# Patient Record
Sex: Female | Born: 1970
Health system: Southern US, Community
[De-identification: ages and names within clinical notes are randomized; demographics above are authoritative.]

## PROBLEM LIST (undated history)

## (undated) DIAGNOSIS — J449 Chronic obstructive pulmonary disease, unspecified: Secondary | ICD-10-CM

## (undated) DIAGNOSIS — K219 Gastro-esophageal reflux disease without esophagitis: Secondary | ICD-10-CM

## (undated) DIAGNOSIS — Z87448 Personal history of other diseases of urinary system: Secondary | ICD-10-CM

## (undated) DIAGNOSIS — I509 Heart failure, unspecified: Secondary | ICD-10-CM

## (undated) DIAGNOSIS — K449 Diaphragmatic hernia without obstruction or gangrene: Secondary | ICD-10-CM

## (undated) DIAGNOSIS — Z90710 Acquired absence of both cervix and uterus: Secondary | ICD-10-CM

## (undated) DIAGNOSIS — M1990 Unspecified osteoarthritis, unspecified site: Secondary | ICD-10-CM

## (undated) DIAGNOSIS — N3010 Interstitial cystitis (chronic) without hematuria: Secondary | ICD-10-CM

## (undated) DIAGNOSIS — M5030 Other cervical disc degeneration, unspecified cervical region: Secondary | ICD-10-CM

## (undated) DIAGNOSIS — M5136 Other intervertebral disc degeneration, lumbar region: Secondary | ICD-10-CM

## (undated) DIAGNOSIS — M542 Cervicalgia: Secondary | ICD-10-CM

## (undated) DIAGNOSIS — G8929 Other chronic pain: Secondary | ICD-10-CM

## (undated) DIAGNOSIS — Z87442 Personal history of urinary calculi: Secondary | ICD-10-CM

## (undated) DIAGNOSIS — M51369 Other intervertebral disc degeneration, lumbar region without mention of lumbar back pain or lower extremity pain: Secondary | ICD-10-CM

## (undated) DIAGNOSIS — Z8709 Personal history of other diseases of the respiratory system: Secondary | ICD-10-CM

## (undated) DIAGNOSIS — Z87440 Personal history of urinary (tract) infections: Secondary | ICD-10-CM

## (undated) HISTORY — DX: Gastro-esophageal reflux disease without esophagitis: K21.9

## (undated) HISTORY — DX: Interstitial cystitis (chronic) without hematuria: N30.10

## (undated) HISTORY — DX: Unspecified osteoarthritis, unspecified site: M19.90

## (undated) HISTORY — DX: Other cervical disc degeneration, unspecified cervical region: M50.30

## (undated) HISTORY — DX: Other intervertebral disc degeneration, lumbar region without mention of lumbar back pain or lower extremity pain: M51.369

## (undated) HISTORY — DX: Personal history of urinary (tract) infections: Z87.440

## (undated) HISTORY — DX: Personal history of other diseases of the respiratory system: Z87.09

## (undated) HISTORY — DX: Personal history of other diseases of urinary system: Z87.448

## (undated) HISTORY — DX: Other intervertebral disc degeneration, lumbar region: M51.36

## (undated) HISTORY — DX: Acquired absence of both cervix and uterus: Z90.710

## (undated) HISTORY — PX: BACK SURGERY: SHX140

## (undated) HISTORY — DX: Personal history of urinary calculi: Z87.442

## (undated) HISTORY — DX: Diaphragmatic hernia without obstruction or gangrene: K44.9

## (undated) HISTORY — PX: VAGINAL HYSTERECTOMY: SUR661

## (undated) HISTORY — PX: BLADDER SURGERY: SHX569

---

## 2008-10-15 ENCOUNTER — Ambulatory Visit: Payer: Self-pay

## 2009-03-10 ENCOUNTER — Ambulatory Visit: Payer: Self-pay | Admitting: Family Medicine

## 2009-09-20 ENCOUNTER — Ambulatory Visit: Payer: Self-pay | Admitting: Family Medicine

## 2009-12-30 ENCOUNTER — Ambulatory Visit: Payer: Self-pay | Admitting: Internal Medicine

## 2010-02-18 ENCOUNTER — Ambulatory Visit: Payer: Self-pay | Admitting: General Practice

## 2010-02-18 ENCOUNTER — Ambulatory Visit: Payer: Self-pay | Admitting: Internal Medicine

## 2010-03-04 ENCOUNTER — Ambulatory Visit: Payer: Self-pay | Admitting: General Practice

## 2010-03-30 ENCOUNTER — Ambulatory Visit: Payer: Self-pay | Admitting: Family Medicine

## 2010-03-31 ENCOUNTER — Ambulatory Visit: Payer: Self-pay | Admitting: Family Medicine

## 2010-11-24 ENCOUNTER — Ambulatory Visit: Payer: Self-pay | Admitting: Family Medicine

## 2011-10-28 ENCOUNTER — Emergency Department: Payer: Self-pay | Admitting: *Deleted

## 2011-10-28 LAB — URINALYSIS, COMPLETE
Bacteria: NONE SEEN
Bilirubin,UR: NEGATIVE
Protein: NEGATIVE
RBC,UR: 3 /HPF (ref 0–5)
Specific Gravity: 1.004 (ref 1.003–1.030)
Squamous Epithelial: 1
WBC UR: 25 /HPF (ref 0–5)

## 2011-11-21 ENCOUNTER — Ambulatory Visit: Payer: Self-pay | Admitting: Urology

## 2011-11-26 ENCOUNTER — Ambulatory Visit: Payer: Self-pay | Admitting: Urology

## 2011-12-28 ENCOUNTER — Emergency Department: Payer: Self-pay | Admitting: *Deleted

## 2012-02-18 ENCOUNTER — Ambulatory Visit: Payer: Self-pay | Admitting: Urology

## 2012-02-22 ENCOUNTER — Ambulatory Visit: Payer: Self-pay | Admitting: Family Medicine

## 2012-05-26 ENCOUNTER — Other Ambulatory Visit: Payer: Self-pay | Admitting: Pain Medicine

## 2012-05-26 ENCOUNTER — Ambulatory Visit: Payer: Self-pay | Admitting: Pain Medicine

## 2012-05-26 LAB — SEDIMENTATION RATE: Erythrocyte Sed Rate: 38 mm/hr — ABNORMAL HIGH (ref 0–20)

## 2012-06-02 ENCOUNTER — Ambulatory Visit: Payer: Self-pay | Admitting: Pain Medicine

## 2012-06-02 LAB — CREATININE, SERUM
Creatinine: 0.77 mg/dL (ref 0.60–1.30)
EGFR (African American): >60
EGFR (Non-African Amer.): >60

## 2012-07-30 ENCOUNTER — Ambulatory Visit: Payer: Self-pay | Admitting: Pain Medicine

## 2012-08-13 ENCOUNTER — Ambulatory Visit: Payer: Self-pay | Admitting: Pain Medicine

## 2012-08-25 ENCOUNTER — Ambulatory Visit: Payer: Self-pay | Admitting: Pain Medicine

## 2012-08-26 ENCOUNTER — Ambulatory Visit: Payer: Self-pay | Admitting: Pain Medicine

## 2012-09-26 ENCOUNTER — Ambulatory Visit: Payer: Self-pay | Admitting: Pain Medicine

## 2012-10-24 ENCOUNTER — Ambulatory Visit: Payer: Self-pay | Admitting: Pain Medicine

## 2012-10-28 ENCOUNTER — Ambulatory Visit: Payer: Self-pay | Admitting: Pain Medicine

## 2012-11-20 ENCOUNTER — Ambulatory Visit: Payer: Self-pay | Admitting: Pain Medicine

## 2013-02-18 DIAGNOSIS — R3911 Hesitancy of micturition: Secondary | ICD-10-CM | POA: Insufficient documentation

## 2013-02-18 DIAGNOSIS — M6289 Other specified disorders of muscle: Secondary | ICD-10-CM | POA: Insufficient documentation

## 2013-02-20 ENCOUNTER — Ambulatory Visit: Payer: Self-pay | Admitting: Pain Medicine

## 2013-06-19 ENCOUNTER — Ambulatory Visit: Payer: Self-pay | Admitting: Pain Medicine

## 2013-10-09 ENCOUNTER — Ambulatory Visit: Payer: Self-pay | Admitting: Pain Medicine

## 2013-10-27 ENCOUNTER — Ambulatory Visit: Payer: Self-pay | Admitting: Pain Medicine

## 2013-11-18 ENCOUNTER — Ambulatory Visit: Payer: Self-pay | Admitting: Pain Medicine

## 2013-12-22 ENCOUNTER — Other Ambulatory Visit: Payer: Self-pay | Admitting: Pain Medicine

## 2013-12-22 ENCOUNTER — Ambulatory Visit: Payer: Self-pay | Admitting: Pain Medicine

## 2013-12-22 LAB — CBC WITH DIFFERENTIAL/PLATELET
BASOS ABS: 0.1 10*3/uL (ref 0.0–0.1)
Basophil %: 1.4 %
EOS ABS: 0.2 10*3/uL (ref 0.0–0.7)
Eosinophil %: 2.7 %
HCT: 37.7 % (ref 35.0–47.0)
HGB: 12.7 g/dL (ref 12.0–16.0)
Lymphocyte #: 3.3 10*3/uL (ref 1.0–3.6)
Lymphocyte %: 36.9 %
MCH: 30.8 pg (ref 26.0–34.0)
MCHC: 33.8 g/dL (ref 32.0–36.0)
MCV: 91 fL (ref 80–100)
MONOS PCT: 5.3 %
Monocyte #: 0.5 x10 3/mm (ref 0.2–0.9)
Neutrophil #: 4.8 10*3/uL (ref 1.4–6.5)
Neutrophil %: 53.7 %
Platelet: 230 10*3/uL (ref 150–440)
RBC: 4.14 10*6/uL (ref 3.80–5.20)
RDW: 12.8 % (ref 11.5–14.5)
WBC: 8.9 10*3/uL (ref 3.6–11.0)

## 2013-12-22 LAB — SEDIMENTATION RATE: Erythrocyte Sed Rate: 6 mm/hr (ref 0–20)

## 2014-01-08 ENCOUNTER — Ambulatory Visit: Payer: Self-pay | Admitting: Pain Medicine

## 2014-01-15 ENCOUNTER — Ambulatory Visit: Payer: Self-pay | Admitting: Pain Medicine

## 2014-04-28 ENCOUNTER — Ambulatory Visit: Payer: Self-pay | Admitting: Pain Medicine

## 2014-09-28 NOTE — Op Note (Signed)
PATIENT NAME:  Brenda OsmondWADE, Valen J MR#:  [id number] DATE OF BIRTH:  Aug 03, 1970  DATE OF PROCEDURE:  02/18/2012  PREOPERATIVE DIAGNOSES:  1. Interstitial cystitis. 2. Recurrent UTIs. 3. Pseudomembranous trigonitis.   PROCEDURES:  1. Cystoscopy.  2. Hydrodilation.  3. Fulguration of trigone.  4. Fulguration of urethral polyps.   ANESTHESIA: General.  SURGEON: Rica KoyanagiJohn S. Caila Cirelli, MD  INDICATIONS: This patient has had symptoms of urinary tract infection, dysuria, frequency and bladder pain. He has been found to have interstitial cystitis in the past. Conservative measures have not controlled her symptoms adequately at this time. Cystoscopy revealed pseudomembranous trigonitis and urethral polyps in addition to her known interstitial cystitis. She is scheduled for cysto, hydrodilation for therapeutic purposes and fulguration of involved areas.   DESCRIPTION OF PROCEDURE: In the dorsal lithotomy position under general anesthesia lower abdomen and genital area was prepped and draped for endoscopic work. The #21 cystoscope was introduced and inspection performed revealing the previously noted pseudomembranous trigonitis and urethral polyps. No stones or tumors were present within the bladder. Hydrodilation was performed, the bladder accepting 800, 875 and 975 mL respectively. Numerous glomerulations occurred following hydrodilation mostly located in the bladder base, peri-trigone area. Following hydrodilation the ball electrode was introduced and the trigone carefully cauterized. Care was taken not to cauterize into the muscle layers of the bladder. The polyps noted in the proximal urethra were likewise cauterized under direct vision. Following the procedure a #16 [place] Foley catheter was introduced.   The patient tolerated the procedure well and returned to the recovery area in satisfactory condition.  ____________________________ Rica KoyanagiJohn S. Trenee Igoe, MD jsh:cms D: 02/18/2012 14:17:07 ET T: 02/18/2012  15:20:48 ET JOB#: [id number]  cc: Rica KoyanagiJohn S. Kennetta Pavlovic, MD, <Dictator>  Rica KoyanagiJOHN S Camiya Vinal MD ELECTRONICALLY SIGNED 02/26/2012 12:31

## 2014-12-06 ENCOUNTER — Ambulatory Visit (INDEPENDENT_AMBULATORY_CARE_PROVIDER_SITE_OTHER): Payer: Medicaid Other | Admitting: Family Medicine

## 2014-12-06 ENCOUNTER — Encounter: Payer: Self-pay | Admitting: Family Medicine

## 2014-12-06 VITALS — BP 120/80 | HR 78 | Ht 62.0 in | Wt 131.0 lb

## 2014-12-06 DIAGNOSIS — J45901 Unspecified asthma with (acute) exacerbation: Secondary | ICD-10-CM

## 2014-12-06 DIAGNOSIS — J219 Acute bronchiolitis, unspecified: Secondary | ICD-10-CM | POA: Diagnosis not present

## 2014-12-06 MED ORDER — AMOXICILLIN-POT CLAVULANATE 875-125 MG PO TABS: 1.0000 | ORAL_TABLET | Freq: Two times a day (BID) | ORAL | Status: DC

## 2014-12-06 MED ORDER — ALBUTEROL SULFATE (2.5 MG/3ML) 0.083% IN NEBU: 2.5000 mg | INHALATION_SOLUTION | Freq: Once | RESPIRATORY_TRACT | Status: DC

## 2014-12-06 MED ORDER — GUAIFENESIN-CODEINE 100-10 MG/5ML PO SOLN: 5.0000 mL | Freq: Three times a day (TID) | ORAL | Status: DC | PRN

## 2014-12-06 MED ORDER — PREDNISONE 10 MG PO TABS: 10.0000 mg | ORAL_TABLET | Freq: Every day | ORAL | Status: DC

## 2014-12-06 NOTE — Patient Instructions (Signed)
Smoking: Ways to Quit   Know why you want to quit.   When you quit smoking, your body gets to work repairing damaged tissues. Here are some of the health benefits:   You stop the destruction of your lungs.  Your lungs are better able to fight colds, and other respiratory infections.  You decrease your risk of cancer, heart disease, strokes, and circulation problems.   In addition, when you quit you will:   Feel more in control of your life.  Have better smelling hair, breath, clothes, home, and car.  Have more stamina for activities.  Save money.  Protect your family and friends from the dangers of secondhand smoke.   Smoking is an addictive habit. Most former smokers make several attempts to quit before they finally succeed. So, never say, "I can't." Just keep trying.   Set a quit date.   Set a date for when you will stop smoking. Don't buy cigarettes to carry you beyond your last day. Tell your family and friends you plan to quit, and ask for their support and encouragement. Ask them not to offer you cigarettes.  Make a plan.   5 Days Before Your Quit Date   Think about your reasons for quitting.  Tell your friends and family you are planning to quit.  Stop buying cigarettes.   4 Days Before Your Quit Date   Pay attention to when and why you smoke.  Think of other things to hold in your hand instead of a cigarette.  Think of habits or routines to change.   3 Days Before Your Quit Date   Plan what you will do with the extra money when you stop buying cigarettes.  Think of whom you can reach out to when you need help.   2 Days Before Your Quit Date   Consider buying nonprescription nicotine patches or nicotine gum. Or see your health care provider to get a prescription for the nicotine inhaler, nasal spray, or other medicine that can help.     1 Day Before Your Quit Date   Put away lighters and ashtrays.  Throw away all cigarettes and matches - no emergency stashes  are allowed!  Clean your clothes to get rid of the smell of cigarette smoke.   Quit Day   Keep very busy.  Remind family and friends that this is your quit day.  Stay away from alcohol.  Stay away from places where you used to smoke and people you used to smoke with.  Give yourself a treat or do something else special.   Commit to staying quit.   Make sure that all your cigarettes and ashtrays are thrown away.   If you keep cigarettes or ashtrays around, sooner or later you'll break down and smoke one, then another, then another, and so on. Throw them away. Make it hard to start again.   Because you are used to having something in your mouth, you may want to chew gum as a substitute for smoking. Or munch on carrots or celery.   Spend time with nonsmokers rather than with smokers.   Think of yourself as a nonsmoker. Tell other people that you are a nonsmoker (for example, in restaurants). Stay away from places where there are a lot of smokers, such as bars. Avoid spending time with smokers. You can't tell others not to smoke, but you don't have to sit with them while they do. Plan on walking away from cigarette smoke. Spend time   with nonsmokers and sit in the nonsmoking section of restaurants.   Be prepared for relapse or difficult situations.   Most people who go back to smoking cigarettes do so within the first 3 months after quitting. Many people try 5 or more times before they successfully quit. Avoid drinking alcohol, because it lowers your chances of success. Don't be distracted by the weight you may gain after quitting. Smokers usually do not gain more than 10 pounds when they stop smoking. Learn new ways to improve your mood and overcome depression.   Start an exercise program.   As you become more fit, you will not want the nicotine effects in your body. Regular exercise will also help keep you from gaining weight.   Keep your hands busy.   You may not know what to do with  your hands for a while. Try reading, knitting, needlework, pottery, drawing, making a plastic model, or doing a jigsaw puzzle. Join special interest groups that keep you involved in your hobby.      Take on new activities.   Change your routine. Take on new activities that don't include smoking. Join an exercise group and work out regularly. Sign up for an evening class or a join a study group at your place of worship. Go on more outings with your family or friends. Learn ways to relax and manage stress.   Join a quit-smoking program if it helps.   Some people do better in groups, or with a set of instructions to follow. That's fine, too. Remember, the goal is to quit smoking. It doesn't matter how you do it.   Consider using nicotine replacement therapy.   Nicotine is the drug that is in tobacco. You can use nicotine patches or gum, available without a prescription at your local pharmacy, to help you quit smoking. Quitting smoking is a two-step process. It includes breaking the physical addiction to nicotine and breaking the smoking habit. Nicotine replacement helps take care of the nicotine addiction so that you can focus on breaking the habit.   Your health care provider can prescribe nicotine substitutes that can almost double your chances of quitting for good. They are:   Zyban (bupropion HCL)  nicotine inhaler  nicotine lozenge  nicotine nasal spray  nicotine patch.   None of these treatments is a miracle cure. Quitting can be hard work, but you can learn to live without cigarettes in your daily life.  

## 2014-12-06 NOTE — Progress Notes (Signed)
Name: Brenda Berry   MRN: [id number]    DOB: Jun 08, 1971   Date:12/06/2014       Progress Note  Subjective  Chief Complaint  Chief Complaint  Patient presents with  . Cough  . Bronchitis    Cough This is a new problem. The current episode started in the past 7 days. The problem has been gradually worsening. The problem occurs constantly. The cough is non-productive. Associated symptoms include nasal congestion, shortness of breath and wheezing. Pertinent negatives include no chest pain, chills, ear congestion, ear pain, eye redness, fever, headaches, heartburn, hemoptysis, myalgias, rash, rhinorrhea, sore throat, sweats or weight loss. Nothing aggravates the symptoms. Risk factors for lung disease include smoking/tobacco exposure. She has tried nothing for the symptoms. The treatment provided mild relief. There is no history of asthma, bronchiectasis, bronchitis, COPD, emphysema, environmental allergies or pneumonia.    No problem-specific assessment & plan notes found for this encounter.   Past Medical History  Diagnosis Date  . Interstitial cystitis     Past Surgical History  Procedure Laterality Date  . Back surgery    . Vaginal hysterectomy    . Bladder surgery      Family History  Problem Relation Age of Onset  . Diabetes Mother   . Hyperlipidemia Mother   . Hypertension Mother   . Heart disease Father     History   Social History  . Marital Status: Divorced    Spouse Name: N/A  . Number of Children: N/A  . Years of Education: N/A   Occupational History  . Not on file.   Social History Main Topics  . Smoking status: Current Every Day Smoker  . Smokeless tobacco: Not on file  . Alcohol Use: 0.0 oz/week    0 Standard drinks or equivalent per week  . Drug Use: No  . Sexual Activity: Yes   Other Topics Concern  . Not on file   Social History Narrative  . No narrative on file    Allergies  Allergen Reactions  . Ciprofloxacin   . Demerol [Meperidine]    . Ibuprofen   . Latex   . Nifedipine   . Sulfa Antibiotics   . Terbutaline      Review of Systems  Constitutional: Negative for fever, chills, weight loss, malaise/fatigue and diaphoresis.  HENT: Negative.  Negative for congestion, ear pain, hearing loss, nosebleeds, rhinorrhea, sore throat and tinnitus.   Eyes: Negative.  Negative for blurred vision, double vision, photophobia, pain, discharge and redness.  Respiratory: Positive for cough, shortness of breath and wheezing. Negative for hemoptysis.   Cardiovascular: Negative.  Negative for chest pain, palpitations, orthopnea, claudication, leg swelling and PND.  Gastrointestinal: Negative.  Negative for heartburn, nausea, vomiting, abdominal pain, diarrhea, constipation, blood in stool and melena.  Genitourinary: Negative.  Negative for dysuria, urgency, frequency, hematuria and flank pain.  Musculoskeletal: Negative for myalgias, back pain, joint pain and falls.  Skin: Negative.  Negative for rash.  Neurological: Negative for dizziness, tingling, tremors, weakness and headaches.  Endo/Heme/Allergies: Negative for environmental allergies.  Psychiatric/Behavioral: Negative for depression.     Objective  Filed Vitals:   12/06/14 1420  BP: 120/80  Pulse: 78  Height: 5\' 2"  (1.575 m)  Weight: 131 lb (59.421 kg)  SpO2: 98%    Physical Exam  Constitutional: She is well-developed, well-nourished, and in no distress. No distress.  HENT:  Head: Normocephalic and atraumatic.  Right Ear: External ear normal.  Left Ear: External ear normal.  Nose: Nose normal.  Mouth/Throat: Oropharynx is clear and moist.  Eyes: Conjunctivae and EOM are normal. Pupils are equal, round, and reactive to light. Right eye exhibits no discharge. Left eye exhibits no discharge.  Neck: Normal range of motion. Neck supple. No JVD present. No thyromegaly present.  Cardiovascular: Normal rate, regular rhythm, normal heart sounds and intact distal pulses.   Exam reveals no gallop and no friction rub.   No murmur heard. Pulmonary/Chest: She is in respiratory distress. She has wheezes. She has no rales.  Abdominal: Soft. Bowel sounds are normal. She exhibits no mass. There is no tenderness. There is no guarding.  Musculoskeletal: Normal range of motion. She exhibits no edema.  Lymphadenopathy:    She has no cervical adenopathy.  Neurological: She is alert. She has normal reflexes.  Skin: Skin is warm and dry. She is not diaphoretic.  Psychiatric: Mood and affect normal.  Nursing note and vitals reviewed.     Assessment & Plan  Problem List Items Addressed This Visit    None    Visit Diagnoses    Reactive airway disease with acute exacerbation    -  Primary    Relevant Medications    predniSONE (DELTASONE) 10 MG tablet    guaiFENesin-codeine 100-10 MG/5ML syrup    albuterol (PROVENTIL) (2.5 MG/3ML) 0.083% nebulizer solution 2.5 mg (Start on 12/06/2014  3:15 PM)    Bronchiolitis        Relevant Medications    predniSONE (DELTASONE) 10 MG tablet    amoxicillin-clavulanate (AUGMENTIN) 875-125 MG per tablet    guaiFENesin-codeine 100-10 MG/5ML syrup         Dr. Hay[facility]  12/06/2014

## 2014-12-07 ENCOUNTER — Other Ambulatory Visit: Payer: Self-pay

## 2014-12-07 DIAGNOSIS — J219 Acute bronchiolitis, unspecified: Secondary | ICD-10-CM

## 2014-12-07 MED ORDER — ALBUTEROL SULFATE HFA 108 (90 BASE) MCG/ACT IN AERS: 2.0000 | INHALATION_SPRAY | Freq: Four times a day (QID) | RESPIRATORY_TRACT | Status: DC | PRN

## 2014-12-08 ENCOUNTER — Other Ambulatory Visit: Payer: Self-pay

## 2014-12-08 DIAGNOSIS — J219 Acute bronchiolitis, unspecified: Secondary | ICD-10-CM

## 2014-12-08 MED ORDER — LEVOFLOXACIN 500 MG PO TABS: 500.0000 mg | ORAL_TABLET | Freq: Every day | ORAL | Status: DC

## 2015-01-28 DIAGNOSIS — Z720 Tobacco use: Secondary | ICD-10-CM | POA: Insufficient documentation

## 2015-04-19 ENCOUNTER — Encounter: Payer: Self-pay | Admitting: Pain Medicine

## 2015-04-25 ENCOUNTER — Other Ambulatory Visit: Payer: Self-pay | Admitting: Pain Medicine

## 2015-04-25 ENCOUNTER — Encounter: Payer: Self-pay | Admitting: Pain Medicine

## 2015-04-25 ENCOUNTER — Ambulatory Visit: Payer: Medicaid Other | Attending: Pain Medicine | Admitting: Pain Medicine

## 2015-04-25 VITALS — BP 142/52 | HR 125 | Temp 99.0°F | Resp 16 | Ht 62.0 in | Wt 130.0 lb

## 2015-04-25 DIAGNOSIS — R102 Pelvic and perineal pain unspecified side: Secondary | ICD-10-CM

## 2015-04-25 DIAGNOSIS — G8929 Other chronic pain: Secondary | ICD-10-CM

## 2015-04-25 DIAGNOSIS — F17200 Nicotine dependence, unspecified, uncomplicated: Secondary | ICD-10-CM | POA: Diagnosis not present

## 2015-04-25 DIAGNOSIS — R52 Pain, unspecified: Secondary | ICD-10-CM

## 2015-04-25 DIAGNOSIS — Z8709 Personal history of other diseases of the respiratory system: Secondary | ICD-10-CM

## 2015-04-25 DIAGNOSIS — M47812 Spondylosis without myelopathy or radiculopathy, cervical region: Secondary | ICD-10-CM | POA: Diagnosis not present

## 2015-04-25 DIAGNOSIS — M5020 Other cervical disc displacement, unspecified cervical region: Secondary | ICD-10-CM

## 2015-04-25 DIAGNOSIS — M1990 Unspecified osteoarthritis, unspecified site: Secondary | ICD-10-CM | POA: Diagnosis not present

## 2015-04-25 DIAGNOSIS — Z720 Tobacco use: Secondary | ICD-10-CM

## 2015-04-25 DIAGNOSIS — F1190 Opioid use, unspecified, uncomplicated: Secondary | ICD-10-CM | POA: Diagnosis not present

## 2015-04-25 DIAGNOSIS — M47816 Spondylosis without myelopathy or radiculopathy, lumbar region: Secondary | ICD-10-CM | POA: Insufficient documentation

## 2015-04-25 DIAGNOSIS — M150 Primary generalized (osteo)arthritis: Secondary | ICD-10-CM

## 2015-04-25 DIAGNOSIS — Z5181 Encounter for therapeutic drug level monitoring: Secondary | ICD-10-CM

## 2015-04-25 DIAGNOSIS — M50221 Other cervical disc displacement at C4-C5 level: Secondary | ICD-10-CM | POA: Diagnosis not present

## 2015-04-25 DIAGNOSIS — M545 Low back pain, unspecified: Secondary | ICD-10-CM

## 2015-04-25 DIAGNOSIS — Z90710 Acquired absence of both cervix and uterus: Secondary | ICD-10-CM

## 2015-04-25 DIAGNOSIS — K449 Diaphragmatic hernia without obstruction or gangrene: Secondary | ICD-10-CM

## 2015-04-25 DIAGNOSIS — G9619 Other disorders of meninges, not elsewhere classified: Secondary | ICD-10-CM | POA: Diagnosis not present

## 2015-04-25 DIAGNOSIS — M5416 Radiculopathy, lumbar region: Secondary | ICD-10-CM

## 2015-04-25 DIAGNOSIS — M5126 Other intervertebral disc displacement, lumbar region: Secondary | ICD-10-CM | POA: Insufficient documentation

## 2015-04-25 DIAGNOSIS — G96198 Other disorders of meninges, not elsewhere classified: Secondary | ICD-10-CM

## 2015-04-25 DIAGNOSIS — M50222 Other cervical disc displacement at C5-C6 level: Secondary | ICD-10-CM | POA: Diagnosis not present

## 2015-04-25 DIAGNOSIS — Z79899 Other long term (current) drug therapy: Secondary | ICD-10-CM | POA: Diagnosis not present

## 2015-04-25 DIAGNOSIS — Z87448 Personal history of other diseases of urinary system: Secondary | ICD-10-CM

## 2015-04-25 DIAGNOSIS — R32 Unspecified urinary incontinence: Secondary | ICD-10-CM

## 2015-04-25 DIAGNOSIS — N3010 Interstitial cystitis (chronic) without hematuria: Secondary | ICD-10-CM

## 2015-04-25 DIAGNOSIS — Z8742 Personal history of other diseases of the female genital tract: Secondary | ICD-10-CM

## 2015-04-25 DIAGNOSIS — M961 Postlaminectomy syndrome, not elsewhere classified: Secondary | ICD-10-CM

## 2015-04-25 DIAGNOSIS — N949 Unspecified condition associated with female genital organs and menstrual cycle: Secondary | ICD-10-CM

## 2015-04-25 DIAGNOSIS — Z87442 Personal history of urinary calculi: Secondary | ICD-10-CM

## 2015-04-25 DIAGNOSIS — M159 Polyosteoarthritis, unspecified: Secondary | ICD-10-CM

## 2015-04-25 DIAGNOSIS — Z79891 Long term (current) use of opiate analgesic: Secondary | ICD-10-CM | POA: Insufficient documentation

## 2015-04-25 DIAGNOSIS — F1120 Opioid dependence, uncomplicated: Secondary | ICD-10-CM

## 2015-04-25 DIAGNOSIS — M533 Sacrococcygeal disorders, not elsewhere classified: Secondary | ICD-10-CM

## 2015-04-25 DIAGNOSIS — M791 Myalgia: Secondary | ICD-10-CM

## 2015-04-25 DIAGNOSIS — J410 Simple chronic bronchitis: Secondary | ICD-10-CM

## 2015-04-25 DIAGNOSIS — M5136 Other intervertebral disc degeneration, lumbar region: Secondary | ICD-10-CM

## 2015-04-25 DIAGNOSIS — J449 Chronic obstructive pulmonary disease, unspecified: Secondary | ICD-10-CM | POA: Insufficient documentation

## 2015-04-25 DIAGNOSIS — M5116 Intervertebral disc disorders with radiculopathy, lumbar region: Secondary | ICD-10-CM

## 2015-04-25 DIAGNOSIS — M4726 Other spondylosis with radiculopathy, lumbar region: Secondary | ICD-10-CM | POA: Diagnosis not present

## 2015-04-25 DIAGNOSIS — M792 Neuralgia and neuritis, unspecified: Secondary | ICD-10-CM

## 2015-04-25 DIAGNOSIS — M51369 Other intervertebral disc degeneration, lumbar region without mention of lumbar back pain or lower extremity pain: Secondary | ICD-10-CM

## 2015-04-25 DIAGNOSIS — M7918 Myalgia, other site: Secondary | ICD-10-CM

## 2015-04-25 DIAGNOSIS — Z87440 Personal history of urinary (tract) infections: Secondary | ICD-10-CM

## 2015-04-25 DIAGNOSIS — M5412 Radiculopathy, cervical region: Secondary | ICD-10-CM

## 2015-04-25 DIAGNOSIS — M539 Dorsopathy, unspecified: Secondary | ICD-10-CM

## 2015-04-25 DIAGNOSIS — Z91041 Radiographic dye allergy status: Secondary | ICD-10-CM

## 2015-04-25 MED ORDER — OXYCODONE HCL 10 MG PO TABS: 10.0000 mg | ORAL_TABLET | ORAL | Status: DC | PRN

## 2015-04-25 MED ORDER — GABAPENTIN 400 MG PO CAPS: 400.0000 mg | ORAL_CAPSULE | Freq: Four times a day (QID) | ORAL | Status: DC

## 2015-04-25 MED ORDER — CYCLOBENZAPRINE HCL 10 MG PO TABS: 10.0000 mg | ORAL_TABLET | Freq: Every day | ORAL | Status: DC | PRN

## 2015-04-25 NOTE — Progress Notes (Signed)
Patient's Name: Brenda Berry MRN: [id number] DOB: 01-16-1971 DOS: 04/25/2015  Primary Reason(s) for Visit: Encounter for Medication Management. CC: Pain   HPI:   Brenda Berry is a 44 y.o. year old, female patient, who returns today as an established patient. She has Chronic pain; Long term current use of opiate analgesic; Long term prescription opiate use; Opiate use; Opiate dependence (HCC); Encounter for therapeutic drug level monitoring; Current tobacco use; Chronic low back pain; Lumbar spondylosis; Chronic radicular Cervical pain (Right C8 Dermatomal distribution); Cervical spondylosis; Chronic radicular Lumbar pain (Right S1 Dermatomal pain); Allergy to contrast media (used for diagnostic x-rays); Failed back surgical syndrome; Epidural fibrosis; Musculoskeletal pain; Neurogenic pain; Neuropathic pain; Bulging lumbar disc (L3-4, L4-5, and L5-S1); Lumbar disc herniation with radiculopathy (large L4-5 disc herniated nucleus pulposus with central and right paracentral extrusion); Chronic pelvic pain in female; History of endometriosis; Sacrococcygeal pain (tailbone pain/sacrococcygeal neuralgia); Coccygodynia; Herniated cervical disc (C4-5 and C5-6); Tobacco abuse; Chronic obstructive pulmonary disease (COPD) (HCC); Urinary incontinence; History of bronchitis; Hiatal hernia; Pain due to interstitial cystitis; History of recurrent UTI (urinary tract infection); History of nephrolithiasis; History of hematuria; Osteoarthrosis; and Status post hysterectomy on her problem list.. Her primarily concern today is the Pain    Since the last time I saw the patient, she refers having had an episode where she was sitting out on the porch and dosed of but when she woke up, her entire right side of the body was paralyzed. She was recently hospitalized and she indicated that at the time she had loss of bowel and bladder control, and again secondary to her spine problems but they were unable to do an MRI because she  could not stay still. This point she thinks that she can and I do agree that we need to do an MRI of the cervical as well as the lumbar spine since the patient is having problems at both levels. Today's Pain Score: 6  Reported level of pain is incompatible with clinical obrservations. This may be secondary to a possible lack of understanding on how the pain scale works. Pain Type: Chronic pain Pain Location: Neck Pain Descriptors / Indicators: Tingling, Sharp, Shooting, Numbness, Burning, Aching Pain Frequency: Constant  Date of Last Visit: Date of Last Visit: 02/03/15 Service Provided on Last Visit: Service Provided on Last Visit: Med Refill  Pharmacotherapy Review:   Side-effects or Adverse reactions: None reported. Effectiveness: Described as relatively effective, allowing for increase in activities of daily living (ADL). Onset of action: Within expected pharmacological parameters. Duration of action: Within normal limits for medication. Peak effect: Timing and results are as within normal expected parameters. [place] PMP: Compliant with practice rules and regulations. UDS: Compliant with practice rules and regulations. Lab work: No new labs ordered by our practice. Treatment compliance: Compliant. Substance Use Disorder (SUD) Risk Level: Low Planned course of action: Continue therapy as is.  Allergies: Brenda Berry is allergic to ciprofloxacin; demerol; ibuprofen; latex; nifedipine; sulfa antibiotics; and terbutaline.  Meds: The patient has a current medication list which includes the following prescription(s): albuterol, cyclobenzaprine, gabapentin, oxycodone hcl, oxycodone hcl, and oxycodone hcl, and the following Facility-Administered Medications: albuterol. Requested Prescriptions   Signed Prescriptions Disp Refills  . Oxycodone HCl 10 MG TABS 180 tablet 0    Sig: Take 1 tablet (10 mg total) by mouth every 4 (four) hours as needed.  . gabapentin (NEURONTIN) 400 MG capsule 120 capsule  2    Sig: Take 1 capsule (400 mg total) by mouth  4 (four) times daily.  . cyclobenzaprine (FLEXERIL) 10 MG tablet 30 tablet 2    Sig: Take 1 tablet (10 mg total) by mouth daily as needed for muscle spasms.  . Oxycodone HCl 10 MG TABS 180 tablet 0    Sig: Take 1 tablet (10 mg total) by mouth every 4 (four) hours as needed.  . Oxycodone HCl 10 MG TABS 180 tablet 0    Sig: Take 1 tablet (10 mg total) by mouth every 4 (four) hours as needed.    ROS: Constitutional: Afebrile, no chills, well hydrated and well nourished Gastrointestinal: negative Musculoskeletal:negative Neurological: negative Behavioral/Psych: negative  PFSH: Medical:  Brenda Berry  has a past medical history of Interstitial cystitis; Arthritis; Degenerative disc disease, cervical; Degenerative disc disease, lumbar; History of bronchitis (04/26/2015); Hiatal hernia (04/26/2015); History of recurrent UTI (urinary tract infection) (04/26/2015); History of nephrolithiasis (04/26/2015); History of hematuria (04/26/2015); and Status post hysterectomy (04/26/2015). Family: family history includes Diabetes in her mother; Heart disease in her father; Hyperlipidemia in her mother; Hypertension in her mother. Surgical:  has past surgical history that includes Back surgery; Vaginal hysterectomy; and Bladder surgery. Tobacco:  reports that she has been smoking.  She does not have any smokeless tobacco history on file. Alcohol:  reports that she does not drink alcohol. Drug:  reports that she does not use illicit drugs.  Physical Exam: Vitals:  Today's Vitals   04/25/15 0849 04/25/15 0850  BP: 142/52   Pulse: 125   Temp: 99 F (37.2 C)   TempSrc: Oral   Resp: 16   Height:  (1.575 m)   Weight: 130 lb (58.968 kg)   SpO2: 100%   PainSc: 6  6   PainLoc: Neck   Calculated BMI: Body mass index is 23.77 kg/(m^2). General appearance: alert, cooperative, appears stated age and mild distress Eyes: conjunctivae/corneas clear. PERRL,  EOM's intact. Fundi benign. Lungs: No evidence respiratory distress, no audible rales or ronchi and no use of accessory muscles of respiration Neck: no adenopathy, no carotid bruit, no JVD, supple, symmetrical, trachea midline and thyroid not enlarged, symmetric, no tenderness/mass/nodules Back: symmetric, no curvature. ROM normal. No CVA tenderness. Extremities: Has a brace on her right wrist. Pulses: 2+ and symmetric Skin: Skin color, texture, turgor normal. No rashes or lesions Neurologic: Grossly normal    Assessment: Encounter Diagnosis:  Primary Diagnosis: Chronic pain [G89.29]  Plan: Marlette was seen today for pain.  Diagnoses and all orders for this visit:  Chronic pain -     Drugs of abuse screen w/o alc, rtn urine-sln; Standing -     COMPLETE METABOLIC PANEL WITH GFR; Future -     C-reactive protein; Future -     Magnesium; Future -     Sedimentation rate; Future -     Vitamin D2,D3 Panel; Future -     Oxycodone HCl 10 MG TABS; Take 1 tablet (10 mg total) by mouth every 4 (four) hours as needed. -     gabapentin (NEURONTIN) 400 MG capsule; Take 1 capsule (400 mg total) by mouth 4 (four) times daily. -     cyclobenzaprine (FLEXERIL) 10 MG tablet; Take 1 tablet (10 mg total) by mouth daily as needed for muscle spasms. -     Oxycodone HCl 10 MG TABS; Take 1 tablet (10 mg total) by mouth every 4 (four) hours as needed. -     Oxycodone HCl 10 MG TABS; Take 1 tablet (10 mg total) by mouth every 4 (four)  hours as needed.  Long term current use of opiate analgesic -     Drugs of abuse screen w/o alc, rtn urine-sln  Long term prescription opiate use  Opiate use  Uncomplicated opioid dependence (HCC)  Encounter for therapeutic drug level monitoring  Chronic low back pain  Osteoarthritis of spine with radiculopathy, lumbar region  Chronic radicular cervical pain (Right C8 dermatomal distribution) -     MR Cervical Spine Wo Contrast; Future -     Ambulatory referral to  Neurosurgery  Cervical spondylosis  Chronic radicular lumbar pain (right side) -     MR Lumbar Spine Wo Contrast; Future -     Ambulatory referral to Neurosurgery  Allergy to contrast media (used for diagnostic x-rays)  Failed back surgical syndrome  Epidural fibrosis  Musculoskeletal pain  Neurogenic pain  Neuropathic pain  Bulging lumbar disc (L3-4, L4-5, and L5-S1)  Lumbar disc herniation with radiculopathy (large L4-5 disc herniated nucleus pulposus with central and right paracentral extrusion)  Chronic pelvic pain in female  History of endometriosis  Sacrococcygeal pain (tailbone pain/sacrococcygeal neuralgia)  Coccygodynia  Herniated cervical disc (C4-5 and C5-6)  Tobacco abuse  Simple chronic bronchitis (HCC)  Urinary incontinence, unspecified incontinence type  History of bronchitis  Hiatal hernia  Pain due to interstitial cystitis  History of recurrent UTI (urinary tract infection)  History of nephrolithiasis  History of hematuria  Primary osteoarthritis involving multiple joints  Status post hysterectomy     Patient Instructions  A prescription for OXYCODONE X 3 was given to you today.  A prescription for FLEXERIL and GABAPENTIN was sent to your pharmacy and should be available for pickup today.   Medications discontinued today:  Medications Discontinued During This Encounter  Medication Reason  . amoxicillin-clavulanate (AUGMENTIN) 875-125 MG per tablet Completed Course  . gabapentin (NEURONTIN) 100 MG capsule Dose change  . gabapentin (NEURONTIN) 300 MG capsule Dose change  . guaiFENesin-codeine 100-10 MG/5ML syrup Completed Course  . levofloxacin (LEVAQUIN) 500 MG tablet Completed Course  . predniSONE (DELTASONE) 10 MG tablet Completed Course  . Oxycodone HCl 10 MG TABS Reorder  . gabapentin (NEURONTIN) 400 MG capsule Reorder  . cyclobenzaprine (FLEXERIL) 10 MG tablet Reorder   Medications administered today:  [name] had no  medications administered during this visit.  Primary Care Physician: Elizabeth Sauereanna Jones, MD [facility] Note by: Sydnee LevansFrancisco A. Laban EmperorNaveira, M.D, DABA, DABAPM, DABPM, DABIPP, FIPP

## 2015-04-25 NOTE — Progress Notes (Signed)
Pill Count: Oxycodone 10 mg - # 2

## 2015-04-25 NOTE — Patient Instructions (Signed)
A prescription for OXYCODONE X 3 was given to you today.  A prescription for FLEXERIL and GABAPENTIN was sent to your pharmacy and should be available for pickup today.

## 2015-04-26 ENCOUNTER — Encounter: Payer: Self-pay | Admitting: Pain Medicine

## 2015-04-26 DIAGNOSIS — Z90710 Acquired absence of both cervix and uterus: Secondary | ICD-10-CM

## 2015-04-26 DIAGNOSIS — M533 Sacrococcygeal disorders, not elsewhere classified: Secondary | ICD-10-CM | POA: Insufficient documentation

## 2015-04-26 DIAGNOSIS — M5020 Other cervical disc displacement, unspecified cervical region: Secondary | ICD-10-CM | POA: Insufficient documentation

## 2015-04-26 DIAGNOSIS — R102 Pelvic and perineal pain: Secondary | ICD-10-CM

## 2015-04-26 DIAGNOSIS — M5416 Radiculopathy, lumbar region: Secondary | ICD-10-CM | POA: Insufficient documentation

## 2015-04-26 DIAGNOSIS — Z8742 Personal history of other diseases of the female genital tract: Secondary | ICD-10-CM | POA: Insufficient documentation

## 2015-04-26 DIAGNOSIS — Z8709 Personal history of other diseases of the respiratory system: Secondary | ICD-10-CM

## 2015-04-26 DIAGNOSIS — Z87448 Personal history of other diseases of urinary system: Secondary | ICD-10-CM

## 2015-04-26 DIAGNOSIS — G96198 Other disorders of meninges, not elsewhere classified: Secondary | ICD-10-CM | POA: Insufficient documentation

## 2015-04-26 DIAGNOSIS — G9619 Other disorders of meninges, not elsewhere classified: Secondary | ICD-10-CM

## 2015-04-26 DIAGNOSIS — Z87442 Personal history of urinary calculi: Secondary | ICD-10-CM

## 2015-04-26 DIAGNOSIS — M7918 Myalgia, other site: Secondary | ICD-10-CM | POA: Insufficient documentation

## 2015-04-26 DIAGNOSIS — N3010 Interstitial cystitis (chronic) without hematuria: Secondary | ICD-10-CM | POA: Insufficient documentation

## 2015-04-26 DIAGNOSIS — M961 Postlaminectomy syndrome, not elsewhere classified: Secondary | ICD-10-CM | POA: Insufficient documentation

## 2015-04-26 DIAGNOSIS — G8929 Other chronic pain: Secondary | ICD-10-CM | POA: Insufficient documentation

## 2015-04-26 DIAGNOSIS — M5136 Other intervertebral disc degeneration, lumbar region: Secondary | ICD-10-CM | POA: Insufficient documentation

## 2015-04-26 DIAGNOSIS — Z720 Tobacco use: Secondary | ICD-10-CM | POA: Insufficient documentation

## 2015-04-26 DIAGNOSIS — M5126 Other intervertebral disc displacement, lumbar region: Secondary | ICD-10-CM | POA: Insufficient documentation

## 2015-04-26 DIAGNOSIS — M1990 Unspecified osteoarthritis, unspecified site: Secondary | ICD-10-CM | POA: Insufficient documentation

## 2015-04-26 DIAGNOSIS — J449 Chronic obstructive pulmonary disease, unspecified: Secondary | ICD-10-CM | POA: Insufficient documentation

## 2015-04-26 DIAGNOSIS — M792 Neuralgia and neuritis, unspecified: Secondary | ICD-10-CM | POA: Insufficient documentation

## 2015-04-26 DIAGNOSIS — Z91041 Radiographic dye allergy status: Secondary | ICD-10-CM | POA: Insufficient documentation

## 2015-04-26 DIAGNOSIS — K449 Diaphragmatic hernia without obstruction or gangrene: Secondary | ICD-10-CM

## 2015-04-26 DIAGNOSIS — R32 Unspecified urinary incontinence: Secondary | ICD-10-CM | POA: Insufficient documentation

## 2015-04-26 DIAGNOSIS — Z87440 Personal history of urinary (tract) infections: Secondary | ICD-10-CM

## 2015-04-26 DIAGNOSIS — M5116 Intervertebral disc disorders with radiculopathy, lumbar region: Secondary | ICD-10-CM | POA: Insufficient documentation

## 2015-04-26 DIAGNOSIS — R52 Pain, unspecified: Secondary | ICD-10-CM

## 2015-04-26 HISTORY — DX: Acquired absence of both cervix and uterus: Z90.710

## 2015-04-26 HISTORY — DX: Personal history of other diseases of the respiratory system: Z87.09

## 2015-04-26 HISTORY — DX: Personal history of other diseases of urinary system: Z87.448

## 2015-04-26 HISTORY — DX: Diaphragmatic hernia without obstruction or gangrene: K44.9

## 2015-04-26 HISTORY — DX: Personal history of urinary calculi: Z87.442

## 2015-04-26 HISTORY — DX: Personal history of urinary (tract) infections: Z87.440

## 2015-05-04 LAB — TOXASSURE SELECT 13 (MW), URINE: PDF: 0

## 2015-05-20 ENCOUNTER — Other Ambulatory Visit: Payer: Self-pay | Admitting: Pain Medicine

## 2015-05-27 ENCOUNTER — Ambulatory Visit
Admission: RE | Admit: 2015-05-27 | Discharge: 2015-05-27 | Disposition: A | Discharge status: Home / Self Care | Payer: Medicaid Other | Source: Ambulatory Visit | Attending: Pain Medicine | Admitting: Pain Medicine

## 2015-05-27 DIAGNOSIS — M5416 Radiculopathy, lumbar region: Secondary | ICD-10-CM | POA: Diagnosis present

## 2015-05-27 DIAGNOSIS — G8929 Other chronic pain: Secondary | ICD-10-CM | POA: Insufficient documentation

## 2015-05-27 DIAGNOSIS — M5412 Radiculopathy, cervical region: Secondary | ICD-10-CM | POA: Insufficient documentation

## 2015-05-31 ENCOUNTER — Other Ambulatory Visit: Payer: Self-pay | Admitting: Pain Medicine

## 2015-07-25 ENCOUNTER — Other Ambulatory Visit: Payer: Self-pay | Admitting: Pain Medicine

## 2015-07-25 ENCOUNTER — Ambulatory Visit: Payer: Medicaid Other | Attending: Pain Medicine | Admitting: Pain Medicine

## 2015-07-25 ENCOUNTER — Encounter: Payer: Self-pay | Admitting: Pain Medicine

## 2015-07-25 VITALS — BP 119/72 | HR 119 | Temp 99.0°F | Resp 16 | Ht 62.0 in | Wt 135.0 lb

## 2015-07-25 DIAGNOSIS — M50222 Other cervical disc displacement at C5-C6 level: Secondary | ICD-10-CM | POA: Diagnosis not present

## 2015-07-25 DIAGNOSIS — M5126 Other intervertebral disc displacement, lumbar region: Secondary | ICD-10-CM | POA: Diagnosis not present

## 2015-07-25 DIAGNOSIS — K449 Diaphragmatic hernia without obstruction or gangrene: Secondary | ICD-10-CM | POA: Insufficient documentation

## 2015-07-25 DIAGNOSIS — Z5181 Encounter for therapeutic drug level monitoring: Secondary | ICD-10-CM

## 2015-07-25 DIAGNOSIS — M5116 Intervertebral disc disorders with radiculopathy, lumbar region: Secondary | ICD-10-CM | POA: Diagnosis not present

## 2015-07-25 DIAGNOSIS — Z90710 Acquired absence of both cervix and uterus: Secondary | ICD-10-CM | POA: Insufficient documentation

## 2015-07-25 DIAGNOSIS — F17200 Nicotine dependence, unspecified, uncomplicated: Secondary | ICD-10-CM | POA: Diagnosis not present

## 2015-07-25 DIAGNOSIS — M5416 Radiculopathy, lumbar region: Secondary | ICD-10-CM | POA: Diagnosis not present

## 2015-07-25 DIAGNOSIS — M542 Cervicalgia: Secondary | ICD-10-CM

## 2015-07-25 DIAGNOSIS — M545 Low back pain: Secondary | ICD-10-CM | POA: Diagnosis not present

## 2015-07-25 DIAGNOSIS — Z79891 Long term (current) use of opiate analgesic: Secondary | ICD-10-CM

## 2015-07-25 DIAGNOSIS — M50221 Other cervical disc displacement at C4-C5 level: Secondary | ICD-10-CM | POA: Diagnosis not present

## 2015-07-25 DIAGNOSIS — M47816 Spondylosis without myelopathy or radiculopathy, lumbar region: Secondary | ICD-10-CM

## 2015-07-25 DIAGNOSIS — F1190 Opioid use, unspecified, uncomplicated: Secondary | ICD-10-CM | POA: Diagnosis not present

## 2015-07-25 DIAGNOSIS — J449 Chronic obstructive pulmonary disease, unspecified: Secondary | ICD-10-CM | POA: Insufficient documentation

## 2015-07-25 DIAGNOSIS — R32 Unspecified urinary incontinence: Secondary | ICD-10-CM | POA: Diagnosis not present

## 2015-07-25 DIAGNOSIS — M533 Sacrococcygeal disorders, not elsewhere classified: Secondary | ICD-10-CM | POA: Diagnosis not present

## 2015-07-25 DIAGNOSIS — M792 Neuralgia and neuritis, unspecified: Secondary | ICD-10-CM

## 2015-07-25 DIAGNOSIS — G8929 Other chronic pain: Secondary | ICD-10-CM | POA: Diagnosis not present

## 2015-07-25 DIAGNOSIS — M549 Dorsalgia, unspecified: Secondary | ICD-10-CM | POA: Diagnosis present

## 2015-07-25 LAB — COMPREHENSIVE METABOLIC PANEL
ALBUMIN: 4.4 g/dL (ref 3.5–5.0)
ALK PHOS: 68 U/L (ref 38–126)
ALT: 18 U/L (ref 14–54)
AST: 21 U/L (ref 15–41)
Anion gap: 8 (ref 5–15)
BILIRUBIN TOTAL: 0.6 mg/dL (ref 0.3–1.2)
BUN: 6 mg/dL (ref 6–20)
CALCIUM: 9.3 mg/dL (ref 8.9–10.3)
CO2: 25 mmol/L (ref 22–32)
Chloride: 107 mmol/L (ref 101–111)
Creatinine, Ser: 0.61 mg/dL (ref 0.44–1.00)
GFR calc Af Amer: >60 mL/min (ref >60)
GFR calc non Af Amer: >60 mL/min (ref >60)
GLUCOSE: 96 mg/dL (ref 65–99)
Potassium: 4.1 mmol/L (ref 3.5–5.1)
SODIUM: 140 mmol/L (ref 135–145)
TOTAL PROTEIN: 7.6 g/dL (ref 6.5–8.1)

## 2015-07-25 LAB — MAGNESIUM: Magnesium: 2.2 mg/dL (ref 1.7–2.4)

## 2015-07-25 LAB — C-REACTIVE PROTEIN: CRP: 0.6 mg/dL (ref <1.0)

## 2015-07-25 LAB — SEDIMENTATION RATE: SED RATE: 8 mm/h (ref 0–20)

## 2015-07-25 MED ORDER — CYCLOBENZAPRINE HCL 10 MG PO TABS: 10.0000 mg | ORAL_TABLET | Freq: Every day | ORAL | Status: DC | PRN

## 2015-07-25 MED ORDER — GABAPENTIN 400 MG PO CAPS: 400.0000 mg | ORAL_CAPSULE | Freq: Four times a day (QID) | ORAL | Status: DC

## 2015-07-25 MED ORDER — GABAPENTIN 600 MG PO TABS: 600.0000 mg | ORAL_TABLET | Freq: Four times a day (QID) | ORAL | Status: DC

## 2015-07-25 MED ORDER — OXYCODONE HCL 10 MG PO TABS: 10.0000 mg | ORAL_TABLET | ORAL | Status: DC | PRN

## 2015-07-25 MED ORDER — GABAPENTIN 100 MG PO CAPS: 100.0000 mg | ORAL_CAPSULE | Freq: Four times a day (QID) | ORAL | Status: DC

## 2015-07-25 NOTE — Progress Notes (Signed)
[facility] pharmacy sent a "Need For Clarification" fax regarding Gabapentin. Reviewed correct doses with Dr. Laban Emperor and returned call to pharmacy to verify these doses.  Gabapentin 600 mg four times daily along with Gabapentin 100 mg four times daily for a total of 700 mg four times daily.

## 2015-07-25 NOTE — Progress Notes (Signed)
Pill count: Oxycodone - # 0

## 2015-07-25 NOTE — Progress Notes (Signed)
Quick Note:  Lab results reviewed and found to be within normal limits. ______ 

## 2015-07-25 NOTE — Progress Notes (Signed)
Patient's Name: Brenda Berry MRN: [id number] DOB: [birth year] DOS: 07/25/2015  Primary Reason(s) for Visit: Encounter for Medication Management CC: Back Pain and Neck Pain   HPI  Brenda Berry is a 45 y.o. year old, female patient, who returns today as an established patient. She has Chronic pain; Long term current use of opiate analgesic; Long term prescription opiate use; Opiate use; Opiate dependence (HCC); Encounter for therapeutic drug level monitoring; Current tobacco use; Chronic low back pain (Location of Primary Source of Pain) (Bilateral) (L>R); Lumbar spondylosis; Chronic radicular Cervical pain (Right C6 Dermatomal distribution); Cervical spondylosis; Chronic radicular Lumbar pain (Location of Tertiary source of pain) (Right S1 Dermatomal pain); Allergy to contrast media (used for diagnostic x-rays); Failed back surgical syndrome; Epidural fibrosis; Musculoskeletal pain; Neurogenic pain; Neuropathic pain; Bulging lumbar disc (L3-4, L4-5, and L5-S1); Lumbar disc herniation with radiculopathy (large L4-5 disc herniated nucleus pulposus with central and right paracentral extrusion); Chronic pelvic pain in female; History of endometriosis; Sacrococcygeal pain (tailbone pain/sacrococcygeal neuralgia); Coccygodynia; Herniated cervical disc (C4-5 and C5-6); Tobacco abuse; Chronic obstructive pulmonary disease (COPD) (HCC); Urinary incontinence; History of bronchitis; Hiatal hernia; Pain due to interstitial cystitis; History of recurrent UTI (urinary tract infection); History of nephrolithiasis; History of hematuria; Osteoarthrosis; Status post hysterectomy; Lumbar facet syndrome (Bilateral) (L>R); and Chronic neck pain (Location of Secondary source of pain) (Bilateral) (R>L) on her problem list.. Her primarily concern today is the Back Pain and Neck Pain   The patient comes into the clinic today for pharmacological management of her chronic pain. The patient indicates that her primary pain is in the lower  back with the left being worse than the right. On the average, her pain is usually the opposite where the right is worse than the left, but for the last couple weeks it has been like this. The patient secondary pain is in the neck with the right side being worse than the left. This is followed by the lower extremity pain where the left is worst on the right. The pain on the left lower extremity stops behind the knee and it travels primarily through the back of the leg but with some pain in the front, but this has been chills lately. In the case of the right lower extremity goes all the way down into her foot where it numbs her pinky and the side of her foot, following an S1 dermatomal distribution. The patient's fourth pain is in the upper extremities with the right being worst on the left. Not long ago she developed a radial nerve palsy in her right upper extremity that lasted from August until recently. This happened for no apparent reason and it has taken her this long to fully recover. She had a wrist drop with this. In the case of the left upper extremity the pain goes all the way to the thumb and the index finger both of which are also numb. This seems to follow a C6 and C7 dermatomal pain.  Reported Pain Score: 6  Reported level is inconsistent with clinical obrservations. Pain Type: Chronic pain Pain Location: Back Pain Orientation: Lower Pain Descriptors / Indicators: Aching, Burning, Shooting, Sharp Pain Frequency: Intermittent  Date of Last Visit: 04/25/15 Service Provided on Last Visit: Med Refill  Pharmacotherapy  Medication(s): Oxycodone IR 10 mg every 4 hours when necessary for pain. Onset of action: Within expected pharmacological parameters. (30 minutes) Time to Peak effect: Timing and results are as within normal expected parameters. (45 minutes to one hour) Analgesic Effect:  30-40% Activity Facilitation: Medication(s) allow patient to sit, stand, walk, and do the basic  ADLs Perceived Effectiveness: Described as relatively effective, allowing for increase in activities of daily living (ADL) Side-effects or Adverse reactions: None reported Duration of action: Within normal limits for medication. (2-3 hours) [place] PMP: Compliant with practice rules and regulations UDS Results: Last UDS done on 04/25/2015 came back within normal limits with no unexpected results. UDS Interpretation: Patient appears to be compliant with practice rules and regulations Medication Assessment Form: Reviewed. Patient indicates being compliant with therapy Treatment compliance: Compliant Substance Use Disorder (SUD) Risk Level: Low Pharmacologic Plan: Continue therapy as is  Lab Work: Illicit Drugs No results found for: THCU, COCAINSCRNUR, PCPSCRNUR, MDMA, AMPHETMU, METHADONE, ETOH  Inflammation Markers Lab Results  Component Value Date   ESRSEDRATE 6 12/22/2013    Renal Function Lab Results  Component Value Date   CREATININE 0.77 06/02/2012   GFRAA >60 06/02/2012   GFRNONAA >60 06/02/2012    Hepatic Function No results found for: AST, ALT, ALBUMIN  Electrolytes No results found for: NA, K, CL, CALCIUM, MG   Allergies  Brenda Berry is allergic to ciprofloxacin; demerol; ibuprofen; iodinated diagnostic agents; latex; nifedipine; sulfa antibiotics; and terbutaline.  Meds  The patient has a current medication list which includes the following prescription(s): acetaminophen, albuterol, cyclobenzaprine, gabapentin, oxycodone hcl, oxycodone hcl, oxycodone hcl, and gabapentin, and the following Facility-Administered Medications: albuterol.  Current Outpatient Prescriptions on File Prior to Visit  Medication Sig  . albuterol (PROVENTIL HFA;VENTOLIN HFA) 108 (90 BASE) MCG/ACT inhaler Inhale 2 puffs into the lungs every 6 (six) hours as needed for wheezing or shortness of breath.   Current Facility-Administered Medications on File Prior to Visit  Medication  . albuterol  (PROVENTIL) (2.5 MG/3ML) 0.083% nebulizer solution 2.5 mg    ROS  Constitutional: Afebrile, no chills, well hydrated and well nourished Gastrointestinal: negative Musculoskeletal:negative Neurological: negative Behavioral/Psych: negative  PFSH  Medical:  Brenda Berry  has a past medical history of Interstitial cystitis; Arthritis; Degenerative disc disease, cervical; Degenerative disc disease, lumbar; History of bronchitis (04/26/2015); Hiatal hernia (04/26/2015); History of recurrent UTI (urinary tract infection) (04/26/2015); History of nephrolithiasis (04/26/2015); History of hematuria (04/26/2015); and Status post hysterectomy (04/26/2015). Family: family history includes Diabetes in her mother; Heart disease in her father; Hyperlipidemia in her mother; Hypertension in her mother. Surgical:  has past surgical history that includes Back surgery; Vaginal hysterectomy; and Bladder surgery. Tobacco:  reports that she has been smoking.  She does not have any smokeless tobacco history on file. Alcohol:  reports that she does not drink alcohol. Drug:  reports that she does not use illicit drugs.  Physical Exam  Vitals:  Today's Vitals   07/25/15 0837 07/25/15 0839  BP: 119/72   Pulse: 119   Temp: 99 F (37.2 C)   TempSrc: Oral   Resp: 16   Height: 5\' 2"  (1.575 m)   Weight: 135 lb (61.236 kg)   SpO2: 100%   PainSc: 6  6   PainLoc: Back     Calculated BMI: Body mass index is 24.69 kg/(m^2).  General appearance: alert, cooperative, appears stated age and no distress Eyes: PERLA Respiratory: No evidence respiratory distress, no audible rales or ronchi and no use of accessory muscles of respiration  Cervical Spine Inspection: Normal anatomy Alignment: Symetrical ROM: Decreased  Upper Extremities Inspection: No gross anomalies detected ROM: Adequate Sensory: Some hypoesthesia into the area of the C6 and C7 dermatomes on the left side and similar  results on the right side. Motor:  Weakness on see 6 distribution on the right side.  Thoracic Spine Inspection: No gross anomalies detected Alignment: Symetrical ROM: Adequate Palpation: WNL  Lumbar Spine Inspection: No gross anomalies detected Alignment: Symetrical ROM: Decreased Palpation: Tender Provocative Tests:  Lumbar Hyperextension and rotation test:  Positive bilaterally Patrick's Maneuver: deferred Gait: WNL  Lower Extremities Inspection: No gross anomalies detected ROM: Adequate Sensory:  Normal Motor: Unremarkable  Toe walk (S1): WNL  Heal walk (L5): WNL  Assessment & Plan  Primary Diagnosis & Pertinent Problem List: The primary encounter diagnosis was Chronic pain. Diagnoses of Encounter for therapeutic drug level monitoring, Long term current use of opiate analgesic, Lumbar disc herniation with radiculopathy (large L4-5 disc herniated nucleus pulposus with central and right paracentral extrusion), Neurogenic pain, Lumbar facet syndrome (Bilateral) (L>R), Chronic low back pain (Location of Primary Source of Pain) (Bilateral) (L>R), Chronic radicular Lumbar pain (Right S1 Dermatomal pain), and Chronic neck pain (Location of Secondary source of pain) (Bilateral) (R>L) were also pertinent to this visit.  Visit Diagnosis: 1. Chronic pain   2. Encounter for therapeutic drug level monitoring   3. Long term current use of opiate analgesic   4. Lumbar disc herniation with radiculopathy (large L4-5 disc herniated nucleus pulposus with central and right paracentral extrusion)   5. Neurogenic pain   6. Lumbar facet syndrome (Bilateral) (L>R)   7. Chronic low back pain (Location of Primary Source of Pain) (Bilateral) (L>R)   8. Chronic radicular Lumbar pain (Right S1 Dermatomal pain)   9. Chronic neck pain (Location of Secondary source of pain) (Bilateral) (R>L)     Assessment: No problem-specific assessment & plan notes found for this encounter.   Plan of Care  Pharmacotherapy (Medications  Ordered): Meds ordered this encounter  Medications  . cyclobenzaprine (FLEXERIL) 10 MG tablet    Sig: Take 1 tablet (10 mg total) by mouth daily as needed for muscle spasms.    Dispense:  30 tablet    Refill:  2    Do not place this medication, or any other prescription from our practice, on "Automatic Refill". Patient may have prescription filled one day early if pharmacy is closed on scheduled refill date.  Marland Kitchen DISCONTD: gabapentin (NEURONTIN) 400 MG capsule    Sig: Take 1 capsule (400 mg total) by mouth 4 (four) times daily.    Dispense:  120 capsule    Refill:  2    Do not place this medication, or any other prescription from our practice, on "Automatic Refill". Patient may have prescription filled one day early if pharmacy is closed on scheduled refill date.  . Oxycodone HCl 10 MG TABS    Sig: Take 1 tablet (10 mg total) by mouth every 4 (four) hours as needed.    Dispense:  180 tablet    Refill:  0    Do not place this medication, or any other prescription from our practice, on "Automatic Refill". Patient may have prescription filled one day early if pharmacy is closed on scheduled refill date. Do not fill until: 07/25/15 To last until:08/24/15  . Oxycodone HCl 10 MG TABS    Sig: Take 1 tablet (10 mg total) by mouth every 4 (four) hours as needed.    Dispense:  180 tablet    Refill:  0    Do not place this medication, or any other prescription from our practice, on "Automatic Refill". Patient may have prescription filled one day early if pharmacy is closed  on scheduled refill date. Do not fill until: 08/24/15 To last until: 09/23/15  . Oxycodone HCl 10 MG TABS    Sig: Take 1 tablet (10 mg total) by mouth every 4 (four) hours as needed.    Dispense:  180 tablet    Refill:  0    Do not place this medication, or any other prescription from our practice, on "Automatic Refill". Patient may have prescription filled one day early if pharmacy is closed on scheduled refill date. Do not  fill until: 09/23/15 To last until: 10/23/15  . gabapentin (NEURONTIN) 600 MG tablet    Sig: Take 1 tablet (600 mg total) by mouth every 6 (six) hours.    Dispense:  120 tablet    Refill:  2    Do not place this medication, or any other prescription from our practice, on "Automatic Refill". Patient may have prescription filled one day early if pharmacy is closed on scheduled refill date.  . gabapentin (NEURONTIN) 100 MG capsule    Sig: Take 1 capsule (100 mg total) by mouth 4 (four) times daily.    Dispense:  120 capsule    Refill:  2    Do not place this medication, or any other prescription from our practice, on "Automatic Refill". Patient may have prescription filled one day early if pharmacy is closed on scheduled refill date.    Lab-work & Procedure Ordered: Orders Placed This Encounter  Procedures  . LUMBAR FACET(MEDIAL BRANCH NERVE BLOCK) MBNB    Standing Status: Standing     Number of Occurrences: 1     Standing Expiration Date: 07/24/2016    Scheduling Instructions:     Side: Bilateral     Level: L2, L3, L4, L5, & S1 Medial Branch Nerve     Sedation: With Sedation.     Timeframe: PRN Procedure. Patient will call to schedule.    Order Specific Question:  Where will this procedure be performed?    Answer:  [facility]  . LUMBAR EPIDURAL STEROID INJECTION    Standing Status: Standing     Number of Occurrences: 1     Standing Expiration Date: 07/24/2016    Scheduling Instructions:     Side: Right-sided     Sedation: With Sedation.     Timeframe: PRN Procedure. Patient will call to schedule.    Order Specific Question:  Where will this procedure be performed?    Answer:  [facility]  . CERVICAL EPIDURAL STEROID INJECTION    Standing Status: Standing     Number of Occurrences: 1     Standing Expiration Date: 07/24/2016    Scheduling Instructions:     Side: Right-sided     Sedation: With Sedation.     Timeframe: PRN Procedure. Patient will call to  schedule.     Indication: Neck pain with or without upper extremity pain, numbness, or weakness. Cervical Spondylosis with or without cervical radicular pain.    Order Specific Question:  Where will this procedure be performed?    Answer:  [facility]  . Drugs of abuse screen w/o alc, rtn urine-sln    Volume: 10 ml(s). Minimum 3 ml of urine is needed. Document temperature of fresh sample. Indications: Long term (current) use of opiate analgesic (Z79.891) Test#: [id number] ([employer] Select-13)  . Comprehensive metabolic panel    Order Specific Question:  Has the patient fasted?    Answer:  No  . C-reactive protein  . Magnesium  . Sedimentation rate  .  Vitamin B12    Indication: Bone Pain (M89.9)  . Vitamin D pnl(25-hydrxy+1,25-dihy)-bld    Imaging Ordered: None  Interventional Therapies: Scheduled: None at this time. PRN Procedures:  1. For low back pain, bilateral lumbar facet block under fluoroscopic guidance and IV sedation.  2. For neck pain and arm pain, right-sided cervical epidural steroid injection under fluoroscopic guidance and IV sedation.  3. For lower extremity pain, right sided L4-5 lumbar epidural steroid injection under fluoroscopic guidance versus bilateral transforaminal epidural steroid injection under fluoroscopic guidance and IV sedation.    Referral(s) or Consult(s): None at this time.  Medications administered during this visit: Brenda Berry had no medications administered during this visit.  Future Appointments Date Time Provider Department Center  10/19/2015 8:40 AM Delano Metz, MD [facility] None    Primary Care Physician: Elizabeth Sauer, MD Location: [facility] Note by: Sydnee Levans. Laban Emperor, M.D, DABA, DABAPM, DABPM, DABIPP, FIPP

## 2015-07-25 NOTE — Patient Instructions (Signed)
GENERAL RISKS AND COMPLICATIONS  What are the risk, side effects and possible complications? Generally speaking, most procedures are safe.  However, with any procedure there are risks, side effects, and the possibility of complications.  The risks and complications are dependent upon the sites that are lesioned, or the type of nerve block to be performed.  The closer the procedure is to the spine, the more serious the risks are.  Great care is taken when placing the radio frequency needles, block needles or lesioning probes, but sometimes complications can occur. 1. Infection: Any time there is an injection through the skin, there is a risk of infection.  This is why sterile conditions are used for these blocks.  There are four possible types of infection. 1. Localized skin infection. 2. Central Nervous System Infection-This can be in the form of Meningitis, which can be deadly. 3. Epidural Infections-This can be in the form of an epidural abscess, which can cause pressure inside of the spine, causing compression of the spinal cord with subsequent paralysis. This would require an emergency surgery to decompress, and there are no guarantees that the patient would recover from the paralysis. 4. Discitis-This is an infection of the intervertebral discs.  It occurs in about 1% of discography procedures.  It is difficult to treat and it may lead to surgery.        2. Pain: the needles have to go through skin and soft tissues, will cause soreness.       3. Damage to internal structures:  The nerves to be lesioned may be near blood vessels or    other nerves which can be potentially damaged.       4. Bleeding: Bleeding is more common if the patient is taking blood thinners such as  aspirin, Coumadin, Ticiid, Plavix, etc., or if he/she have some genetic predisposition  such as hemophilia. Bleeding into the spinal canal can cause compression of the spinal  cord with subsequent paralysis.  This would require an  emergency surgery to  decompress and there are no guarantees that the patient would recover from the  paralysis.       5. Pneumothorax:  Puncturing of a lung is a possibility, every time a needle is introduced in  the area of the chest or upper back.  Pneumothorax refers to free air around the  collapsed lung(s), inside of the thoracic cavity (chest cavity).  Another two possible  complications related to a similar event would include: Hemothorax and Chylothorax.   These are variations of the Pneumothorax, where instead of air around the collapsed  lung(s), you may have blood or chyle, respectively.       6. Spinal headaches: They may occur with any procedures in the area of the spine.       7. Persistent CSF (Cerebro-Spinal Fluid) leakage: This is a rare problem, but may occur  with prolonged intrathecal or epidural catheters either due to the formation of a fistulous  track or a dural tear.       8. Nerve damage: By working so close to the spinal cord, there is always a possibility of  nerve damage, which could be as serious as a permanent spinal cord injury with  paralysis.       9. Death:  Although rare, severe deadly allergic reactions known as "Anaphylactic  reaction" can occur to any of the medications used.      10. Worsening of the symptoms:  We can always make thing worse.    What are the chances of something like this happening? Chances of any of this occuring are extremely low.  By statistics, you have more of a chance of getting killed in a motor vehicle accident: while driving to the hospital than any of the above occurring .  Nevertheless, you should be aware that they are possibilities.  In general, it is similar to taking a shower.  Everybody knows that you can slip, hit your head and get killed.  Does that mean that you should not shower again?  Nevertheless always keep in mind that statistics do not mean anything if you happen to be on the wrong side of them.  Even if a procedure has a 1  (one) in a 1,000,000 (million) chance of going wrong, it you happen to be that one..Also, keep in mind that by statistics, you have more of a chance of having something go wrong when taking medications.  Who should not have this procedure? If you are on a blood thinning medication (e.g. Coumadin, Plavix, see list of "Blood Thinners"), or if you have an active infection going on, you should not have the procedure.  If you are taking any blood thinners, please inform your physician.  How should I prepare for this procedure?  Do not eat or drink anything at least six hours prior to the procedure.  Bring a driver with you .  It cannot be a taxi.  Come accompanied by an adult that can drive you back, and that is strong enough to help you if your legs get weak or numb from the local anesthetic.  Take all of your medicines the morning of the procedure with just enough water to swallow them.  If you have diabetes, make sure that you are scheduled to have your procedure done first thing in the morning, whenever possible.  If you have diabetes, take only half of your insulin dose and notify our nurse that you have done so as soon as you arrive at the clinic.  If you are diabetic, but only take blood sugar pills (oral hypoglycemic), then do not take them on the morning of your procedure.  You may take them after you have had the procedure.  Do not take aspirin or any aspirin-containing medications, at least eleven (11) days prior to the procedure.  They may prolong bleeding.  Wear loose fitting clothing that may be easy to take off and that you would not mind if it got stained with Betadine or blood.  Do not wear any jewelry or perfume  Remove any nail coloring.  It will interfere with some of our monitoring equipment.  NOTE: Remember that this is not meant to be interpreted as a complete list of all possible complications.  Unforeseen problems may occur.  BLOOD THINNERS The following drugs  contain aspirin or other products, which can cause increased bleeding during surgery and should not be taken for 2 weeks prior to and 1 week after surgery.  If you should need take something for relief of minor pain, you may take acetaminophen which is found in Tylenol,m Datril, Anacin-3 and Panadol. It is not blood thinner. The products listed below are.  Do not take any of the products listed below in addition to any listed on your instruction sheet.  A.P.C or A.P.C with Codeine Codeine Phosphate Capsules #3 Ibuprofen Ridaura  ABC compound Congesprin Imuran rimadil  Advil Cope Indocin Robaxisal  Alka-Seltzer Effervescent Pain Reliever and Antacid Coricidin or Coricidin-D  Indomethacin Rufen    Alka-Seltzer plus Cold Medicine Cosprin Ketoprofen S-A-C Tablets  Anacin Analgesic Tablets or Capsules Coumadin Korlgesic Salflex  Anacin Extra Strength Analgesic tablets or capsules CP-2 Tablets Lanoril Salicylate  Anaprox Cuprimine Capsules Levenox Salocol  Anexsia-D Dalteparin Magan Salsalate  Anodynos Darvon compound Magnesium Salicylate Sine-off  Ansaid Dasin Capsules Magsal Sodium Salicylate  Anturane Depen Capsules Marnal Soma  APF Arthritis pain formula Dewitt's Pills Measurin Stanback  Argesic Dia-Gesic Meclofenamic Sulfinpyrazone  Arthritis Bayer Timed Release Aspirin Diclofenac Meclomen Sulindac  Arthritis pain formula Anacin Dicumarol Medipren Supac  Analgesic (Safety coated) Arthralgen Diffunasal Mefanamic Suprofen  Arthritis Strength Bufferin Dihydrocodeine Mepro Compound Suprol  Arthropan liquid Dopirydamole Methcarbomol with Aspirin Synalgos  ASA tablets/Enseals Disalcid Micrainin Tagament  Ascriptin Doan's Midol Talwin  Ascriptin A/D Dolene Mobidin Tanderil  Ascriptin Extra Strength Dolobid Moblgesic Ticlid  Ascriptin with Codeine Doloprin or Doloprin with Codeine Momentum Tolectin  Asperbuf Duoprin Mono-gesic Trendar  Aspergum Duradyne Motrin or Motrin IB Triminicin  Aspirin  plain, buffered or enteric coated Durasal Myochrisine Trigesic  Aspirin Suppositories Easprin Nalfon Trillsate  Aspirin with Codeine Ecotrin Regular or Extra Strength Naprosyn Uracel  Atromid-S Efficin Naproxen Ursinus  Auranofin Capsules Elmiron Neocylate Vanquish  Axotal Emagrin Norgesic Verin  Azathioprine Empirin or Empirin with Codeine Normiflo Vitamin E  Azolid Emprazil Nuprin Voltaren  Bayer Aspirin plain, buffered or children's or timed BC Tablets or powders Encaprin Orgaran Warfarin Sodium  Buff-a-Comp Enoxaparin Orudis Zorpin  Buff-a-Comp with Codeine Equegesic Os-Cal-Gesic   Buffaprin Excedrin plain, buffered or Extra Strength Oxalid   Bufferin Arthritis Strength Feldene Oxphenbutazone   Bufferin plain or Extra Strength Feldene Capsules Oxycodone with Aspirin   Bufferin with Codeine Fenoprofen Fenoprofen Pabalate or Pabalate-SF   Buffets II Flogesic Panagesic   Buffinol plain or Extra Strength Florinal or Florinal with Codeine Panwarfarin   Buf-Tabs Flurbiprofen Penicillamine   Butalbital Compound Four-way cold tablets Penicillin   Butazolidin Fragmin Pepto-Bismol   Carbenicillin Geminisyn Percodan   Carna Arthritis Reliever Geopen Persantine   Carprofen Gold's salt Persistin   Chloramphenicol Goody's Phenylbutazone   Chloromycetin Haltrain Piroxlcam   Clmetidine heparin Plaquenil   Cllnoril Hyco-pap Ponstel   Clofibrate Hydroxy chloroquine Propoxyphen         Before stopping any of these medications, be sure to consult the physician who ordered them.  Some, such as Coumadin (Warfarin) are ordered to prevent or treat serious conditions such as "deep thrombosis", "pumonary embolisms", and other heart problems.  The amount of time that you may need off of the medication may also vary with the medication and the reason for which you were taking it.  If you are taking any of these medications, please make sure you notify your pain physician before you undergo any  procedures.         Epidural Steroid Injection An epidural steroid injection is given to relieve pain in your neck, back, or legs that is caused by the irritation or swelling of a nerve root. This procedure involves injecting a steroid and numbing medicine (anesthetic) into the epidural space. The epidural space is the space between the outer covering of your spinal cord and the bones that form your backbone (vertebra).  LET [facility] CARE PROVIDER KNOW ABOUT:  2. Any allergies you have. 3. All medicines you are taking, including vitamins, herbs, eye drops, creams, and over-the-counter medicines such as aspirin. 4. Previous problems you or members of your family have had with the use of anesthetics. 5. Any blood disorders or blood clotting disorders you have.  6. Previous surgeries you have had. 7. Medical conditions you have. RISKS AND COMPLICATIONS Generally, this is a safe procedure. However, as with any procedure, complications can occur. Possible complications of epidural steroid injection include:  Headache.  Bleeding.  Infection.  Allergic reaction to the medicines.  Damage to your nerves. The response to this procedure depends on the underlying cause of the pain and its duration. People who have long-term (chronic) pain are less likely to benefit from epidural steroids than are those people whose pain comes on strong and suddenly. BEFORE THE PROCEDURE   Ask your health care provider about changing or stopping your regular medicines. You may be advised to stop taking blood-thinning medicines a few days before the procedure.  You may be given medicines to reduce anxiety.  Arrange for someone to take you home after the procedure. PROCEDURE   You will remain awake during the procedure. You may receive medicine to make you relaxed.  You will be asked to lie on your stomach.  The injection site will be cleaned.  The injection site will be numbed with a medicine (local  anesthetic).  A needle will be injected through your skin into the epidural space.  Your health care provider will use an X-ray machine to ensure that the steroid is delivered closest to the affected nerve. You may have minimal discomfort at this time.  Once the needle is in the right position, the local anesthetic and the steroid will be injected into the epidural space.  The needle will then be removed and a bandage will be applied to the injection site. AFTER THE PROCEDURE  12. You may be monitored for a short time before you go home. 13. You may feel weakness or numbness in your arm or leg, which disappears within hours. 14. You may be allowed to eat, drink, and take your regular medicine. 15. You may have soreness at the site of the injection.   This information is not intended to replace advice given to you by your health care provider. Make sure you discuss any questions you have with your health care provider.   Document Released: 09/04/2007 Document Revised: 01/28/2013 Document Reviewed: 11/14/2012 Elsevier Interactive Patient Education 2016 [employer] Facet Joint Block The facet joints connect the bones of the spine (vertebrae). They make it possible for you to bend, twist, and make other movements with your spine. They also prevent you from overbending, overtwisting, and making other excessive movements.  A facet joint block is a procedure where a numbing medicine (anesthetic) is injected into a facet joint. Often, a type of anti-inflammatory medicine called a steroid is also injected. A facet joint block may be done for two reasons:  8. Diagnosis. A facet joint block may be done as a test to see whether neck or back pain is caused by a worn-down or infected facet joint. If the pain gets better after a facet joint block, it means the pain is probably coming from the facet joint. If the pain does not get better, it means the pain is probably not coming from the facet joint.   9. Therapy. A facet joint block may be done to relieve neck or back pain caused by a facet joint. A facet joint block is only done as a therapy if the pain does not improve with medicine, exercise programs, physical therapy, and other forms of pain management. LET [facility] CARE PROVIDER KNOW ABOUT:   Any allergies you have.   All medicines you  are taking, including vitamins, herbs, eyedrops, and over-the-counter medicines and creams.   Previous problems you or members of your family have had with the use of anesthetics.   Any blood disorders you have had.   Other health problems you have. RISKS AND COMPLICATIONS Generally, having a facet joint block is safe. However, as with any procedure, complications can occur. Possible complications associated with having a facet joint block include:   Bleeding.   Injury to a nerve near the injection site.   Pain at the injection site.   Weakness or numbness in areas controlled by nerves near the injection site.   Infection.   Temporary fluid retention.   Allergic reaction to anesthetics or medicines used during the procedure. BEFORE THE PROCEDURE   Follow your health care provider's instructions if you are taking dietary supplements or medicines. You may need to stop taking them or reduce your dosage.   Do not take any new dietary supplements or medicines without asking your health care provider first.   Follow your health care provider's instructions about eating and drinking before the procedure. You may need to stop eating and drinking several hours before the procedure.   Arrange to have an adult drive you home after the procedure. PROCEDURE 16. You may need to remove your clothing and dress in an open-back gown so that your health care provider can access your spine.  17. The procedure will be done while you are lying on an X-ray table. Most of the time you will be asked to lie on your stomach, but you may be asked  to lie in a different position if an injection will be made in your neck.  18. Special machines will be used to monitor your oxygen levels, heart rate, and blood pressure.  19. If an injection will be made in your neck, an intravenous (IV) tube will be inserted into one of your veins. Fluids and medicine will flow directly into your body through the IV tube.  20. The area over the facet joint where the injection will be made will be cleaned with an antiseptic soap. The surrounding skin will be covered with sterile drapes.  21. An anesthetic will be applied to your skin to make the injection area numb. You may feel a temporary stinging or burning sensation.  22. A video X-ray machine will be used to locate the joint. A contrast dye may be injected into the facet joint area to help with locating the joint.  23. When the joint is located, an anesthetic medicine will be injected into the joint through the needle.  24. Your health care provider will ask you whether you feel pain relief. If you do feel relief, a steroid may be injected to provide pain relief for a longer period of time. If you do not feel relief or feel only partial relief, additional injections of an anesthetic may be made in other facet joints.  25. The needle will be removed, the skin will be cleansed, and bandages will be applied.  AFTER THE PROCEDURE   You will be observed for 15-30 minutes before being allowed to go home. Do not drive. Have an adult drive you or take a taxi or public transportation instead.   If you feel pain relief, the pain will return in several hours or days when the anesthetic wears off.   You may feel pain relief 2-14 days after the procedure. The amount of time this relief lasts varies from person to person.  It is normal to feel some tenderness over the injected area(s) for 2 days following the procedure.   If you have diabetes, you may have a temporary increase in blood sugar.   This  information is not intended to replace advice given to you by your health care provider. Make sure you discuss any questions you have with your health care provider.   Document Released: 10/17/2006 Document Revised: 06/18/2014 Document Reviewed: 03/17/2012 Elsevier Interactive Patient Education [employer]

## 2015-07-29 LAB — TOXASSURE SELECT 13 (MW), URINE: PDF: 0

## 2015-08-07 NOTE — Progress Notes (Signed)
Quick Note:  The results of this UDS were read as unexpected due to the absence or presence of a parent drug or metabolite. This may represent a metabolic anomaly, rather than a compliance issue. ______ 

## 2015-10-19 ENCOUNTER — Encounter: Payer: Medicaid Other | Admitting: Pain Medicine

## 2015-10-19 ENCOUNTER — Telehealth: Payer: Self-pay

## 2015-10-19 NOTE — Telephone Encounter (Signed)
Pt wants

## 2015-10-24 ENCOUNTER — Telehealth: Payer: Self-pay

## 2015-10-24 ENCOUNTER — Encounter: Payer: Self-pay | Admitting: Pain Medicine

## 2015-10-24 ENCOUNTER — Ambulatory Visit: Payer: Medicaid Other | Attending: Pain Medicine | Admitting: Pain Medicine

## 2015-10-24 VITALS — BP 130/68 | HR 124 | Temp 99.1°F | Resp 16 | Ht 62.0 in | Wt 130.0 lb

## 2015-10-24 DIAGNOSIS — M5126 Other intervertebral disc displacement, lumbar region: Secondary | ICD-10-CM | POA: Insufficient documentation

## 2015-10-24 DIAGNOSIS — F17210 Nicotine dependence, cigarettes, uncomplicated: Secondary | ICD-10-CM | POA: Diagnosis not present

## 2015-10-24 DIAGNOSIS — R200 Anesthesia of skin: Secondary | ICD-10-CM | POA: Insufficient documentation

## 2015-10-24 DIAGNOSIS — G8929 Other chronic pain: Secondary | ICD-10-CM

## 2015-10-24 DIAGNOSIS — M50221 Other cervical disc displacement at C4-C5 level: Secondary | ICD-10-CM | POA: Diagnosis not present

## 2015-10-24 DIAGNOSIS — M791 Myalgia: Secondary | ICD-10-CM | POA: Diagnosis not present

## 2015-10-24 DIAGNOSIS — M47816 Spondylosis without myelopathy or radiculopathy, lumbar region: Secondary | ICD-10-CM

## 2015-10-24 DIAGNOSIS — J449 Chronic obstructive pulmonary disease, unspecified: Secondary | ICD-10-CM | POA: Diagnosis not present

## 2015-10-24 DIAGNOSIS — Z87442 Personal history of urinary calculi: Secondary | ICD-10-CM | POA: Diagnosis not present

## 2015-10-24 DIAGNOSIS — Z9079 Acquired absence of other genital organ(s): Secondary | ICD-10-CM | POA: Diagnosis not present

## 2015-10-24 DIAGNOSIS — M549 Dorsalgia, unspecified: Secondary | ICD-10-CM | POA: Diagnosis present

## 2015-10-24 DIAGNOSIS — R591 Generalized enlarged lymph nodes: Secondary | ICD-10-CM | POA: Insufficient documentation

## 2015-10-24 DIAGNOSIS — M47812 Spondylosis without myelopathy or radiculopathy, cervical region: Secondary | ICD-10-CM | POA: Diagnosis not present

## 2015-10-24 DIAGNOSIS — M47896 Other spondylosis, lumbar region: Secondary | ICD-10-CM | POA: Insufficient documentation

## 2015-10-24 DIAGNOSIS — M533 Sacrococcygeal disorders, not elsewhere classified: Secondary | ICD-10-CM | POA: Diagnosis not present

## 2015-10-24 DIAGNOSIS — M5416 Radiculopathy, lumbar region: Secondary | ICD-10-CM

## 2015-10-24 DIAGNOSIS — M545 Low back pain: Secondary | ICD-10-CM

## 2015-10-24 DIAGNOSIS — M542 Cervicalgia: Secondary | ICD-10-CM | POA: Diagnosis present

## 2015-10-24 DIAGNOSIS — F1190 Opioid use, unspecified, uncomplicated: Secondary | ICD-10-CM

## 2015-10-24 DIAGNOSIS — Z79891 Long term (current) use of opiate analgesic: Secondary | ICD-10-CM

## 2015-10-24 DIAGNOSIS — G9619 Other disorders of meninges, not elsewhere classified: Secondary | ICD-10-CM | POA: Insufficient documentation

## 2015-10-24 DIAGNOSIS — R531 Weakness: Secondary | ICD-10-CM | POA: Diagnosis not present

## 2015-10-24 DIAGNOSIS — Z5181 Encounter for therapeutic drug level monitoring: Secondary | ICD-10-CM | POA: Diagnosis not present

## 2015-10-24 DIAGNOSIS — M792 Neuralgia and neuritis, unspecified: Secondary | ICD-10-CM

## 2015-10-24 DIAGNOSIS — K449 Diaphragmatic hernia without obstruction or gangrene: Secondary | ICD-10-CM | POA: Diagnosis not present

## 2015-10-24 DIAGNOSIS — M7918 Myalgia, other site: Secondary | ICD-10-CM

## 2015-10-24 DIAGNOSIS — M539 Dorsopathy, unspecified: Secondary | ICD-10-CM

## 2015-10-24 DIAGNOSIS — M961 Postlaminectomy syndrome, not elsewhere classified: Secondary | ICD-10-CM

## 2015-10-24 MED ORDER — OXYCODONE HCL 10 MG PO TABS: 10.0000 mg | ORAL_TABLET | ORAL | Status: DC | PRN

## 2015-10-24 MED ORDER — GABAPENTIN 600 MG PO TABS: 600.0000 mg | ORAL_TABLET | Freq: Four times a day (QID) | ORAL | Status: DC

## 2015-10-24 MED ORDER — GABAPENTIN 100 MG PO CAPS: 100.0000 mg | ORAL_CAPSULE | Freq: Four times a day (QID) | ORAL | Status: DC

## 2015-10-24 MED ORDER — CYCLOBENZAPRINE HCL 10 MG PO TABS: 10.0000 mg | ORAL_TABLET | Freq: Every day | ORAL | Status: DC | PRN

## 2015-10-24 NOTE — Progress Notes (Signed)
Patient's Name: Brenda Berry  Patient type: Established  MRN: [id number]  Service setting: Ambulatory outpatient  DOB: 01-Nov-1970  Location: [facility]  DOS: 10/24/2015  Primary Care Physician: Elizabeth Sauereanna Jones, MD  Note by: Sydnee LevansFrancisco A. Laban EmperorNaveira, M.D, DABA, DABAPM, DABPM, DABIPP, FIPP  Referring Physician: Duanne LimerickJones, Deanna C, MD  Specialty: Board-Certified Interventional Pain Management  Last Visit to Pain Management: 10/24/2015   Primary Reason(s) for Visit: Encounter for prescription drug management (Level of risk: moderate) CC: Back Pain and Neck Pain   HPI  Brenda Berry is a 45 y.o. year old, female patient, who returns today as an established patient. She has Chronic pain; Long term current use of opiate analgesic; Long term prescription opiate use; Opiate use (90 MME/Day); Opiate dependence (HCC); Encounter for therapeutic drug level monitoring; Current tobacco use; Chronic low back pain (Location of Primary Source of Pain) (Bilateral) (L>R); Lumbar spondylosis; Chronic radicular Cervical pain (Right C6 Dermatomal distribution); Cervical spondylosis; Chronic radicular Lumbar pain (Location of Tertiary source of pain) (Right S1 Dermatomal pain); Allergy to contrast media (used for diagnostic x-rays); Failed back surgical syndrome; Epidural fibrosis; Musculoskeletal pain; Neurogenic pain; Neuropathic pain; Bulging lumbar disc (L3-4, L4-5, and L5-S1); Lumbar disc herniation with radiculopathy (large L4-5 disc herniated nucleus pulposus with central and right paracentral extrusion); Chronic pelvic pain in female; History of endometriosis; Sacrococcygeal pain (tailbone pain/sacrococcygeal neuralgia); Coccygodynia; Herniated cervical disc (C4-5 and C5-6); Tobacco abuse; Chronic obstructive pulmonary disease (COPD) (HCC); Urinary incontinence; History of bronchitis; Hiatal hernia; Pain due to interstitial cystitis; History of recurrent UTI (urinary tract infection); History of  nephrolithiasis; History of hematuria; Osteoarthrosis; Status post hysterectomy; Lumbar facet syndrome (Bilateral) (R>L); and Chronic neck pain (Location of Secondary source of pain) (Bilateral) (R>L) on her problem list.. Her primarily concern today is the Back Pain and Neck Pain   Pain Assessment: Self-Reported Pain Score: 5 , clinically she looks like a 1-2/10 Reported level is inconsistent with clinical obrservations. The patient was provided with the "pain score" handout. Pain Type: Chronic pain Pain Location: Back Pain Orientation: Lower Pain Descriptors / Indicators: Aching, Sharp, Pins and needles Pain Frequency: Constant  The patient comes into the clinics today for pharmacological management of her chronic pain. I last saw this patient on 07/25/2015. Her body mass index is 23.77 kg/(m^2). The patient  reports that she does not use illicit drugs.  Date of Last Visit: 07/25/15 Service Provided on Last Visit: Med Refill  Controlled Substance Pharmacotherapy Assessment & REMS (Risk Evaluation and Mitigation Strategy)  Analgesic: Oxycodone IR 10 mg every 4 hours (60  Mg/day of oxycodone) Pill Count: Oxycodone pill count #0/180. Filled 09-23-15. Based on the refill date this should've lasted until 10/24/2015. MME/day: 90 mg/day Pharmacokinetics: Onset of action (Liberation/Absorption): Within expected pharmacological parameters Time to Peak effect (Distribution): Timing and results are as within normal expected parameters Duration of action (Metabolism/Excretion): Within normal limits for medication Pharmacodynamics: Analgesic Effect: More than 50% Activity Facilitation: Medication(s) allow patient to sit, stand, walk, and do the basic ADLs Perceived Effectiveness: Described as relatively effective, allowing for increase in activities of daily living (ADL) Side-effects or Adverse reactions: None reported Monitoring: [place] PMP: Online review of the past 7560-month period conducted.  Compliant with practice rules and regulations UDS Results/interpretation: The patient's last UDS was done on 07/25/2015 a came back with unexpected results since it did not show any significant levels of oxycodone. However, it did have all of its metabolites present suggesting that the absence of the  oxycodone is due to either a lapse of time since the last dose or an unusual pharmacokinetic form of metabolism (rapid metabolism). In any case, it is considered to be compliant. Medication Assessment Form: Reviewed. Patient indicates being compliant with therapy Treatment compliance: Compliant Risk Assessment: Aberrant Behavior: None observed today Substance Use Disorder (SUD) Risk Level: Moderate-to-high Risk of opioid abuse or dependence: 0.7-3.0% with doses ? 36 MME/day and 6.1-26% with doses ? 120 MME/day. Opioid Risk Tool (ORT) Score: Total Score: 0 Low Risk for SUD (Score <3) Depression Scale Score: PHQ-2: PHQ-2 Total Score: 0 No depression (0) PHQ-9: PHQ-9 Total Score: 0 No depression (0-4)  Pharmacologic Plan: No change in therapy, at this time  Laboratory Chemistry  Inflammation Markers Lab Results  Component Value Date   ESRSEDRATE 8 07/25/2015   CRP 0.6 07/25/2015    Renal Function Lab Results  Component Value Date   BUN 6 07/25/2015   CREATININE 0.61 07/25/2015   GFRAA >60 07/25/2015   GFRNONAA >60 07/25/2015    Hepatic Function Lab Results  Component Value Date   AST 21 07/25/2015   ALT 18 07/25/2015   ALBUMIN 4.4 07/25/2015    Electrolytes Lab Results  Component Value Date   NA 140 07/25/2015   K 4.1 07/25/2015   CL 107 07/25/2015   CALCIUM 9.3 07/25/2015   MG 2.2 07/25/2015    Pain Modulating Vitamins No results found for: VD25OH, VD125OH2TOT, ZO1096EA5, WU9811BJ4, VITAMINB12  Coagulation Parameters No results found for: INR, LABPROT  Note: I personally reviewed the above data. Results made available to patient.  Recent Diagnostic Imaging  Mr  Cervical Spine Wo Contrast  05/27/2015  CLINICAL DATA:  Neck pain and bilateral arm pain, right greater than left. Arm and hand weakness. EXAM: MRI CERVICAL SPINE WITHOUT CONTRAST TECHNIQUE: Multiplanar, multisequence MR imaging of the cervical spine was performed. No intravenous contrast was administered. COMPARISON:  MRI dated 08/25/2012 FINDINGS: The cervical spinal cord and visualized intracranial structures are normal. There is no facet arthritis in the cervical spine. The patient does have slightly prominent lymph nodes in both sides of the neck. Largest lymph node is 11 mm in diameter on the right on image 4 of series 6. These lymph nodes were present on the prior except exam but have slightly diffusely enlarged. This is nonspecific. Craniocervical junction through C3-4:  Normal. C4-5: Resolution of the prominent soft disc protrusion central and slightly to the left on the prior exam. There is a small residual central disc bulge with no neural impingement. Widely patent neural foramina. C5-6: Tiny disc bulges to the right and left of midline with no neural impingement. Small central disc bulge present on the study of 03/04/2010 is no longer present. C6-7 through T2-3:  Normal. IMPRESSION: 1. Resolution of central disc protrusion at C4-5. 2. Minimal degenerative disc disease C4-5 and C5-6 with no neural impingement. Widely patent spinal canal and neural foramina. 3. Slight prominence of lymph nodes in both sides of the neck, slightly increased since the prior exams. This is nonspecific. Electronically Signed   By: Francene Boyers M.D.   On: 05/27/2015 09:15   Mr Lumbar Spine Wo Contrast  05/27/2015  CLINICAL DATA:  Chronic progressive low back pain and bilateral leg pain, right greater than left. Numbness and burning and tingling in both legs. EXAM: MRI LUMBAR SPINE WITHOUT CONTRAST TECHNIQUE: Multiplanar, multisequence MR imaging of the lumbar spine was performed. No intravenous contrast was  administered. COMPARISON:  MRIs   Dated 01/08/2014 and  06/02/2012 FINDINGS: Normal conus tip at L1-2.  Normal paraspinal soft tissues. T11-12 through L3-4: No significant abnormality and no significant change. Minimal hypertrophy of the ligamentum flavum at L2-3 and L3-4. L4-5: Previous right laminectomy. Hypertrophy of the left ligamentum flavum. There is either scarring or a small chronic disc protrusion central and to the left disc which appears essentially unchanged since the prior study. Neural foramina are widely patent. L5-S1:  Normal. IMPRESSION: 1. Postsurgical changes at L4-5 with what is most likely either scarring or a small chronic disc protrusion central and to the left, unchanged. The right nerve roots are not impinged upon. There could possibly be some neural impingement of the left L5 nerve. 2. No other significant abnormality. Electronically Signed   By: Francene Boyers M.D.   On: 05/27/2015 08:58    Meds  The patient has a current medication list which includes the following prescription(s): acetaminophen, albuterol, cyclobenzaprine, gabapentin, gabapentin, oxycodone hcl, oxycodone hcl, and oxycodone hcl.  Current Outpatient Prescriptions on File Prior to Visit  Medication Sig  . acetaminophen (TYLENOL) 325 MG tablet Take 650 mg by mouth.  Marland Kitchen albuterol (PROVENTIL HFA;VENTOLIN HFA) 108 (90 BASE) MCG/ACT inhaler Inhale 2 puffs into the lungs every 6 (six) hours as needed for wheezing or shortness of breath.   No current facility-administered medications on file prior to visit.    ROS  Constitutional: Denies any fever or chills Gastrointestinal: No reported hemesis, hematochezia, vomiting, or acute GI distress Musculoskeletal: Denies any acute onset joint swelling, redness, loss of ROM, or weakness Neurological: No reported episodes of acute onset apraxia, aphasia, dysarthria, agnosia, amnesia, paralysis, loss of coordination, or loss of consciousness  Allergies  Ms. Kowal is  allergic to ciprofloxacin; demerol; ibuprofen; iodinated diagnostic agents; latex; nifedipine; sulfa antibiotics; and terbutaline.  PFSH  Medical:  Ms. Rothman  has a past medical history of Interstitial cystitis; Arthritis; Degenerative disc disease, cervical; Degenerative disc disease, lumbar; History of bronchitis (04/26/2015); Hiatal hernia (04/26/2015); History of recurrent UTI (urinary tract infection) (04/26/2015); History of nephrolithiasis (04/26/2015); History of hematuria (04/26/2015); and Status post hysterectomy (04/26/2015). Family: family history includes Diabetes in her mother; Heart disease in her father; Hyperlipidemia in her mother; Hypertension in her mother. Surgical:  has past surgical history that includes Back surgery; Vaginal hysterectomy; and Bladder surgery. Tobacco:  reports that she has been smoking.  She does not have any smokeless tobacco history on file. Alcohol:  reports that she does not drink alcohol. Drug:  reports that she does not use illicit drugs.  Constitutional Exam  Vitals: Blood pressure 130/68, pulse 124, temperature 99.1 F (37.3 C), resp. rate 16, height  (1.575 m), weight 130 lb (58.968 kg), SpO2 100 %. General appearance: Well nourished, well developed, and well hydrated. In no acute distress Calculated BMI/Body habitus: Body mass index is 23.77 kg/(m^2). (18.5-24.9 kg/m2) Ideal body weight Psych/Mental status: Alert and oriented x 3 (person, place, & time) Eyes: PERLA Respiratory: No evidence of acute respiratory distress  Cervical Spine Exam  Inspection: No masses, redness, or swelling Alignment: Symmetrical ROM: Functional: Adequate ROM Active: Unrestricted ROM Stability: No instability detected Muscle strength & Tone: Functionally intact Sensory: Unimpaired Palpation: No complaints of tenderness  Upper Extremity (UE) Exam    Side: Right upper extremity  Side: Left upper extremity  Inspection: No masses, redness, swelling, or  asymmetry  Inspection: No masses, redness, swelling, or asymmetry  ROM:  ROM:  Functional: Adequate ROM  Functional: Adequate ROM  Active: Unrestricted ROM  Active:  Unrestricted ROM  Muscle strength & Tone: Functionally intact  Muscle strength & Tone: Functionally intact  Sensory: Unimpaired  Sensory: Unimpaired  Palpation: Non-contributory  Palpation: Non-contributory   Thoracic Spine Exam  Inspection: No masses, redness, or swelling Alignment: Symmetrical ROM: Functional: Adequate ROM Active: Unrestricted ROM Stability: No instability detected Sensory: Unimpaired Muscle strength & Tone: Functionally intact Palpation: No complaints of tenderness  Lumbar Spine Exam  Inspection: No masses, redness, or swelling. Well-healed scar. Alignment: Symmetrical ROM: Functional: Limited ROM Active: Decreased ROM Stability: No instability detected Muscle strength & Tone: Functionally intact Sensory: Unimpaired Palpation: Tender Provocative Tests: Lumbar Hyperextension and rotation test: Positive for bilateral lumbar facet pain. Patrick's Maneuver: deferred  Gait & Posture Assessment  Gait: Unaffected Posture: WNL  Lower Extremity Exam    Side: Right lower extremity  Side: Left lower extremity  Inspection: No masses, redness, swelling, or asymmetry ROM:  Inspection: No masses, redness, swelling, or asymmetry ROM:  Functional: Adequate ROM  Functional: Adequate ROM  Active: Unrestricted ROM  Active: Unrestricted ROM  Muscle strength & Tone: Functionally intact  Muscle strength & Tone: Functionally intact  Sensory: Unimpaired  Sensory: Unimpaired  Palpation: Non-contributory  Palpation: Non-contributory   Assessment & Plan  Primary Diagnosis & Pertinent Problem List: The primary encounter diagnosis was Chronic pain. Diagnoses of Long term current use of opiate analgesic, Encounter for therapeutic drug level monitoring, Chronic low back pain (Location of Primary Source of Pain)  (Bilateral) (L>R), Neurogenic pain, Musculoskeletal pain, Cervical spondylosis, Lumbar spondylosis, unspecified spinal osteoarthritis, Chronic radicular Lumbar pain (Location of Tertiary source of pain) (Right S1 Dermatomal pain), Opiate use (90 MME/Day), Failed back surgical syndrome, and Lumbar facet syndrome (Bilateral) (R>L) were also pertinent to this visit.  Visit Diagnosis: 1. Chronic pain   2. Long term current use of opiate analgesic   3. Encounter for therapeutic drug level monitoring   4. Chronic low back pain (Location of Primary Source of Pain) (Bilateral) (L>R)   5. Neurogenic pain   6. Musculoskeletal pain   7. Cervical spondylosis   8. Lumbar spondylosis, unspecified spinal osteoarthritis   9. Chronic radicular Lumbar pain (Location of Tertiary source of pain) (Right S1 Dermatomal pain)   10. Opiate use (90 MME/Day)   11. Failed back surgical syndrome   12. Lumbar facet syndrome (Bilateral) (R>L)     Problems updated and reviewed during this visit: Problem  Opiate use (90 MME/Day)   Oxycodone IR 10 mg every 4 hours (60  Mg/day of oxycodone)     Problem-specific Plan(s): No problem-specific assessment & plan notes found for this encounter.  No new assessment & plan notes have been filed under this hospital service since the last note was generated. Service: Pain Management   Plan of Care   Problem List Items Addressed This Visit      High   Cervical spondylosis (Chronic)   Relevant Medications   cyclobenzaprine (FLEXERIL) 10 MG tablet   Oxycodone HCl 10 MG TABS   Oxycodone HCl 10 MG TABS   Oxycodone HCl 10 MG TABS   Other Relevant Orders   CERVICAL EPIDURAL STEROID INJECTION   Chronic low back pain (Location of Primary Source of Pain) (Bilateral) (L>R) (Chronic)   Relevant Medications   cyclobenzaprine (FLEXERIL) 10 MG tablet   Oxycodone HCl 10 MG TABS   Oxycodone HCl 10 MG TABS   Oxycodone HCl 10 MG TABS   Chronic pain - Primary (Chronic)   Relevant  Medications   gabapentin (NEURONTIN) 100 MG  capsule   gabapentin (NEURONTIN) 600 MG tablet   cyclobenzaprine (FLEXERIL) 10 MG tablet   Oxycodone HCl 10 MG TABS   Oxycodone HCl 10 MG TABS   Oxycodone HCl 10 MG TABS   Chronic radicular Lumbar pain (Location of Tertiary source of pain) (Right S1 Dermatomal pain) (Chronic)   Relevant Orders   LUMBAR EPIDURAL STEROID INJECTION   Failed back surgical syndrome (Chronic)   Relevant Medications   cyclobenzaprine (FLEXERIL) 10 MG tablet   Oxycodone HCl 10 MG TABS   Oxycodone HCl 10 MG TABS   Oxycodone HCl 10 MG TABS   Lumbar facet syndrome (Bilateral) (R>L) (Chronic)   Relevant Medications   cyclobenzaprine (FLEXERIL) 10 MG tablet   Oxycodone HCl 10 MG TABS   Oxycodone HCl 10 MG TABS   Oxycodone HCl 10 MG TABS   Lumbar spondylosis (Chronic)   Relevant Medications   cyclobenzaprine (FLEXERIL) 10 MG tablet   Oxycodone HCl 10 MG TABS   Oxycodone HCl 10 MG TABS   Oxycodone HCl 10 MG TABS   Other Relevant Orders   LUMBAR FACET(MEDIAL BRANCH NERVE BLOCK) MBNB   Musculoskeletal pain (Chronic)   Relevant Medications   cyclobenzaprine (FLEXERIL) 10 MG tablet   Neurogenic pain (Chronic)   Relevant Medications   gabapentin (NEURONTIN) 100 MG capsule   gabapentin (NEURONTIN) 600 MG tablet     Medium   Encounter for therapeutic drug level monitoring   Long term current use of opiate analgesic (Chronic)   Relevant Orders   ToxASSURE Select 13 (MW), Urine   Opiate use (90 MME/Day) (Chronic)       Pharmacotherapy (Medications Ordered): Meds ordered this encounter  Medications  . gabapentin (NEURONTIN) 100 MG capsule    Sig: Take 1 capsule (100 mg total) by mouth 4 (four) times daily.    Dispense:  120 capsule    Refill:  2    Do not place this medication, or any other prescription from our practice, on "Automatic Refill". Patient may have prescription filled one day Brenda if pharmacy is closed on scheduled refill date.  . gabapentin  (NEURONTIN) 600 MG tablet    Sig: Take 1 tablet (600 mg total) by mouth every 6 (six) hours.    Dispense:  120 tablet    Refill:  2    Do not place this medication, or any other prescription from our practice, on "Automatic Refill". Patient may have prescription filled one day Brenda if pharmacy is closed on scheduled refill date.  . cyclobenzaprine (FLEXERIL) 10 MG tablet    Sig: Take 1 tablet (10 mg total) by mouth daily as needed for muscle spasms.    Dispense:  30 tablet    Refill:  2    Do not place this medication, or any other prescription from our practice, on "Automatic Refill". Patient may have prescription filled one day Brenda if pharmacy is closed on scheduled refill date.  . Oxycodone HCl 10 MG TABS    Sig: Take 1 tablet (10 mg total) by mouth every 4 (four) hours as needed.    Dispense:  180 tablet    Refill:  0    Do not place this medication, or any other prescription from our practice, on "Automatic Refill". Patient may have prescription filled one day Brenda if pharmacy is closed on scheduled refill date. Do not fill until: 10/24/15 To last until: 11/23/15  . Oxycodone HCl 10 MG TABS    Sig: Take 1 tablet (10 mg total) by mouth  every 4 (four) hours as needed.    Dispense:  180 tablet    Refill:  0    Do not place this medication, or any other prescription from our practice, on "Automatic Refill". Patient may have prescription filled one day Brenda if pharmacy is closed on scheduled refill date. Do not fill until: 11/23/15 To last until: 12/23/15  . Oxycodone HCl 10 MG TABS    Sig: Take 1 tablet (10 mg total) by mouth every 4 (four) hours as needed.    Dispense:  180 tablet    Refill:  0    Do not place this medication, or any other prescription from our practice, on "Automatic Refill". Patient may have prescription filled one day Brenda if pharmacy is closed on scheduled refill date. Do not fill until: 12/23/15 To last until: 01/22/16    [facility] & Procedure  Ordered: Orders Placed This Encounter  Procedures  . LUMBAR EPIDURAL STEROID INJECTION  . LUMBAR FACET(MEDIAL BRANCH NERVE BLOCK) MBNB  . CERVICAL EPIDURAL STEROID INJECTION  . ToxASSURE Select 13 (MW), Urine    Imaging Ordered: None  Interventional Therapies: Scheduled:  None at this time.    Considering:   Lumbar facet block possibly followed by radiofrequency ablation, if effective.    PRN Procedures:   Right cervical epidural steroid injection under fluoroscopic guidance and IV sedation for the neck and arm pain.  Diagnostic bilateral lumbar facet block under fluoroscopic guidance and IV sedation for the low back pain.  Palliative right-sided L4-5 lumbar epidural steroid injection under fluoroscopic guidance and IV sedation for the lower extremity pain.   Referral(s) or Consult(s): None at this time.  New Prescriptions   No medications on file    Medications administered during this visit: Ms. Mantey had no medications administered during this visit.  Requested PM Follow-up: Return in about 3 months (around 01/09/2016) for Medication Management, (3-Mo), Procedure (PRN - Patient will call).  Future Appointments Date Time Provider Department Center  01/04/2016 8:20 AM Delano Metz, MD [facility] None    Primary Care Physician: Elizabeth Sauer, MD Location: [facility] Note by: Sydnee Levans. Laban Emperor, M.D, DABA, DABAPM, DABPM, DABIPP, FIPP  Pain Score Disclaimer: We use the NRS-11 scale. This is a self-reported, subjective measurement of pain severity with only modest accuracy. It is used primarily to identify changes within a particular patient. It must be understood that outpatient pain scales are significantly less accurate that those used for research, where they can be applied under ideal controlled circumstances with minimal exposure to variables. In reality, the score is likely to be a combination of pain intensity and pain affect, where pain  affect describes the degree of emotional arousal or changes in action readiness caused by the sensory experience of pain. Factors such as social and work situation, setting, emotional state, anxiety levels, expectation, and prior pain experience may influence pain perception and show large inter-individual differences that may also be affected by time variables.  Patient instructions provided during this appointment: Patient Instructions  Smoking Cessation, Tips for Success If you are ready to quit smoking, congratulations! You have chosen to help yourself be healthier. Cigarettes bring nicotine, tar, carbon monoxide, and other irritants into your body. Your lungs, heart, and blood vessels will be able to work better without these poisons. There are many different ways to quit smoking. Nicotine gum, nicotine patches, a nicotine inhaler, or nicotine nasal spray can help with physical craving. Hypnosis, support groups, and medicines help break the  habit of smoking. WHAT THINGS CAN I DO TO MAKE QUITTING EASIER?  Here are some tips to help you quit for good: 1. Pick a date when you will quit smoking completely. Tell all of your friends and family about your plan to quit on that date. 2. Do not try to slowly cut down on the number of cigarettes you are smoking. Pick a quit date and quit smoking completely starting on that day. 3. Throw away all cigarettes.  4. Clean and remove all ashtrays from your home, work, and car. 5. On a card, write down your reasons for quitting. Carry the card with you and read it when you get the urge to smoke. 6. Cleanse your body of nicotine. Drink enough water and fluids to keep your urine clear or pale yellow. Do this after quitting to flush the nicotine from your body. 7. Learn to predict your moods. Do not let a bad situation be your excuse to have a cigarette. Some situations in your life might tempt you into wanting a cigarette. 8. Never have "just one" cigarette. It  leads to wanting another and another. Remind yourself of your decision to quit. 9. Change habits associated with smoking. If you smoked while driving or when feeling stressed, try other activities to replace smoking. Stand up when drinking your coffee. Brush your teeth after eating. Sit in a different chair when you read the paper. Avoid alcohol while trying to quit, and try to drink fewer caffeinated beverages. Alcohol and caffeine may urge you to smoke. 10. Avoid foods and drinks that can trigger a desire to smoke, such as sugary or spicy foods and alcohol. 11. Ask people who smoke not to smoke around you. 12. Have something planned to do right after eating or having a cup of coffee. For example, plan to take a walk or exercise. 13. Try a relaxation exercise to calm you down and decrease your stress. Remember, you may be tense and nervous for the first 2 weeks after you quit, but this will pass. 14. Find new activities to keep your hands busy. Play with a pen, coin, or rubber band. Doodle or draw things on paper. 15. Brush your teeth right after eating. This will help cut down on the craving for the taste of tobacco after meals. You can also try mouthwash.  16. Use oral substitutes in place of cigarettes. Try using lemon drops, carrots, cinnamon sticks, or chewing gum. Keep them handy so they are available when you have the urge to smoke. 17. When you have the urge to smoke, try deep breathing. 18. Designate your home as a nonsmoking area. 19. If you are a heavy smoker, ask your health care provider about a prescription for nicotine chewing gum. It can ease your withdrawal from nicotine. 20. Reward yourself. Set aside the cigarette money you save and buy yourself something nice. 21. Look for support from others. Join a support group or smoking cessation program. Ask someone at home or at work to help you with your plan to quit smoking. 22. Always ask yourself, "Do I need this cigarette or is this  just a reflex?" Tell yourself, "Today, I choose not to smoke," or "I do not want to smoke." You are reminding yourself of your decision to quit. 23. Do not replace cigarette smoking with electronic cigarettes (commonly called e-cigarettes). The safety of e-cigarettes is unknown, and some may contain harmful chemicals. 24. If you relapse, do not give up! Plan ahead and think about  what you will do the next time you get the urge to smoke. HOW WILL I FEEL WHEN I QUIT SMOKING? You may have symptoms of withdrawal because your body is used to nicotine (the addictive substance in cigarettes). You may crave cigarettes, be irritable, feel very hungry, cough often, get headaches, or have difficulty concentrating. The withdrawal symptoms are only temporary. They are strongest when you first quit but will go away within 10-14 days. When withdrawal symptoms occur, stay in control. Think about your reasons for quitting. Remind yourself that these are signs that your body is healing and getting used to being without cigarettes. Remember that withdrawal symptoms are easier to treat than the major diseases that smoking can cause.  Even after the withdrawal is over, expect periodic urges to smoke. However, these cravings are generally short lived and will go away whether you smoke or not. Do not smoke! WHAT RESOURCES ARE AVAILABLE TO HELP ME QUIT SMOKING? Your health care provider can direct you to community resources or hospitals for support, which may include: 1. Group support. 2. Education. 3. Hypnosis. 4. Therapy.   This information is not intended to replace advice given to you by your health care provider. Make sure you discuss any questions you have with your health care provider.   Document Released: 02/24/2004 Document Revised: 06/18/2014 Document Reviewed: 11/13/2012 Elsevier Interactive Patient Education 2016 [employer] Epidural Steroid Injection Patient Information  Description: The epidural space  surrounds the nerves as they exit the spinal cord.  In some patients, the nerves can be compressed and inflamed by a bulging disc or a tight spinal canal (spinal stenosis).  By injecting steroids into the epidural space, we can bring irritated nerves into direct contact with a potentially helpful medication.  These steroids act directly on the irritated nerves and can reduce swelling and inflammation which often leads to decreased pain.  Epidural steroids may be injected anywhere along the spine and from the neck to the low back depending upon the location of your pain.   After numbing the skin with local anesthetic (like Novocaine), a small needle is passed into the epidural space slowly.  You may experience a sensation of pressure while this is being done.  The entire block usually last less than 10 minutes.  Conditions which may be treated by epidural steroids:   Low back and leg pain  Neck and arm pain  Spinal stenosis  Post-laminectomy syndrome  Herpes zoster (shingles) pain  Pain from compression fractures  Preparation for the injection:  5. Do not eat any solid food or dairy products within 8 hours of your appointment.  6. You may drink clear liquids up to 3 hours before appointment.  Clear liquids include water, black coffee, juice or soda.  No milk or cream please. 7. You may take your regular medication, including pain medications, with a sip of water before your appointment  Diabetics should hold regular insulin (if taken separately) and take 1/2 normal NPH dos the morning of the procedure.  Carry some sugar containing items with you to your appointment. 8. A driver must accompany you and be prepared to drive you home after your procedure.  9. Bring all your current medications with your. 10. An IV may be inserted and sedation may be given at the discretion of the physician.   11. A blood pressure cuff, EKG and other monitors will often be applied during the procedure.  Some  patients may need to have extra oxygen administered for  a short period. 12. You will be asked to provide medical information, including your allergies, prior to the procedure.  We must know immediately if you are taking blood thinners (like Coumadin/Warfarin)  Or if you are allergic to IV iodine contrast (dye). We must know if you could possible be pregnant.  Possible side-effects:  Bleeding from needle site  Infection (rare, may require surgery)  Nerve injury (rare)  Numbness & tingling (temporary)  Difficulty urinating (rare, temporary)  Spinal headache ( a headache worse with upright posture)  Light -headedness (temporary)  Pain at injection site (several days)  Decreased blood pressure (temporary)  Weakness in arm/leg (temporary)  Pressure sensation in back/neck (temporary)  Call if you experience:  Fever/chills associated with headache or increased back/neck pain.  Headache worsened by an upright position.  New onset weakness or numbness of an extremity below the injection site  Hives or difficulty breathing (go to the emergency room)  Inflammation or drainage at the infection site  Severe back/neck pain  Any new symptoms which are concerning to you  Please note:  Although the local anesthetic injected can often make your back or neck feel good for several hours after the injection, the pain will likely return.  It takes 3-7 days for steroids to work in the epidural space.  You may not notice any pain relief for at least that one week.  If effective, we will often do a series of three injections spaced 3-6 weeks apart to maximally decrease your pain.  After the initial series, we generally will wait several months before considering a repeat injection of the same type.  If you have any questions, please call [phone] [facility]

## 2015-10-24 NOTE — Patient Instructions (Addendum)
Smoking Cessation, Tips for Success If you are ready to quit smoking, congratulations! You have chosen to help yourself be healthier. Cigarettes bring nicotine, tar, carbon monoxide, and other irritants into your body. Your lungs, heart, and blood vessels will be able to work better without these poisons. There are many different ways to quit smoking. Nicotine gum, nicotine patches, a nicotine inhaler, or nicotine nasal spray can help with physical craving. Hypnosis, support groups, and medicines help break the habit of smoking. WHAT THINGS CAN I DO TO MAKE QUITTING EASIER?  Here are some tips to help you quit for good: 1. Pick a date when you will quit smoking completely. Tell all of your friends and family about your plan to quit on that date. 2. Do not try to slowly cut down on the number of cigarettes you are smoking. Pick a quit date and quit smoking completely starting on that day. 3. Throw away all cigarettes.  4. Clean and remove all ashtrays from your home, work, and car. 5. On a card, write down your reasons for quitting. Carry the card with you and read it when you get the urge to smoke. 6. Cleanse your body of nicotine. Drink enough water and fluids to keep your urine clear or pale yellow. Do this after quitting to flush the nicotine from your body. 7. Learn to predict your moods. Do not let a bad situation be your excuse to have a cigarette. Some situations in your life might tempt you into wanting a cigarette. 8. Never have "just one" cigarette. It leads to wanting another and another. Remind yourself of your decision to quit. 9. Change habits associated with smoking. If you smoked while driving or when feeling stressed, try other activities to replace smoking. Stand up when drinking your coffee. Brush your teeth after eating. Sit in a different chair when you read the paper. Avoid alcohol while trying to quit, and try to drink fewer caffeinated beverages. Alcohol and caffeine may urge you  to smoke. 10. Avoid foods and drinks that can trigger a desire to smoke, such as sugary or spicy foods and alcohol. 11. Ask people who smoke not to smoke around you. 12. Have something planned to do right after eating or having a cup of coffee. For example, plan to take a walk or exercise. 13. Try a relaxation exercise to calm you down and decrease your stress. Remember, you may be tense and nervous for the first 2 weeks after you quit, but this will pass. 14. Find new activities to keep your hands busy. Play with a pen, coin, or rubber band. Doodle or draw things on paper. 15. Brush your teeth right after eating. This will help cut down on the craving for the taste of tobacco after meals. You can also try mouthwash.  16. Use oral substitutes in place of cigarettes. Try using lemon drops, carrots, cinnamon sticks, or chewing gum. Keep them handy so they are available when you have the urge to smoke. 17. When you have the urge to smoke, try deep breathing. 18. Designate your home as a nonsmoking area. 19. If you are a heavy smoker, ask your health care provider about a prescription for nicotine chewing gum. It can ease your withdrawal from nicotine. 20. Reward yourself. Set aside the cigarette money you save and buy yourself something nice. 21. Look for support from others. Join a support group or smoking cessation program. Ask someone at home or at work to help you with your plan   to quit smoking. 22. Always ask yourself, "Do I need this cigarette or is this just a reflex?" Tell yourself, "Today, I choose not to smoke," or "I do not want to smoke." You are reminding yourself of your decision to quit. 23. Do not replace cigarette smoking with electronic cigarettes (commonly called e-cigarettes). The safety of e-cigarettes is unknown, and some may contain harmful chemicals. 24. If you relapse, do not give up! Plan ahead and think about what you will do the next time you get the urge to smoke. HOW WILL  I FEEL WHEN I QUIT SMOKING? You may have symptoms of withdrawal because your body is used to nicotine (the addictive substance in cigarettes). You may crave cigarettes, be irritable, feel very hungry, cough often, get headaches, or have difficulty concentrating. The withdrawal symptoms are only temporary. They are strongest when you first quit but will go away within 10-14 days. When withdrawal symptoms occur, stay in control. Think about your reasons for quitting. Remind yourself that these are signs that your body is healing and getting used to being without cigarettes. Remember that withdrawal symptoms are easier to treat than the major diseases that smoking can cause.  Even after the withdrawal is over, expect periodic urges to smoke. However, these cravings are generally short lived and will go away whether you smoke or not. Do not smoke! WHAT RESOURCES ARE AVAILABLE TO HELP ME QUIT SMOKING? Your health care provider can direct you to community resources or hospitals for support, which may include: 1. Group support. 2. Education. 3. Hypnosis. 4. Therapy.   This information is not intended to replace advice given to you by your health care provider. Make sure you discuss any questions you have with your health care provider.   Document Released: 02/24/2004 Document Revised: 06/18/2014 Document Reviewed: 11/13/2012 Elsevier Interactive Patient Education 2016 [employer] Epidural Steroid Injection Patient Information  Description: The epidural space surrounds the nerves as they exit the spinal cord.  In some patients, the nerves can be compressed and inflamed by a bulging disc or a tight spinal canal (spinal stenosis).  By injecting steroids into the epidural space, we can bring irritated nerves into direct contact with a potentially helpful medication.  These steroids act directly on the irritated nerves and can reduce swelling and inflammation which often leads to decreased pain.  Epidural  steroids may be injected anywhere along the spine and from the neck to the low back depending upon the location of your pain.   After numbing the skin with local anesthetic (like Novocaine), a small needle is passed into the epidural space slowly.  You may experience a sensation of pressure while this is being done.  The entire block usually last less than 10 minutes.  Conditions which may be treated by epidural steroids:   Low back and leg pain  Neck and arm pain  Spinal stenosis  Post-laminectomy syndrome  Herpes zoster (shingles) pain  Pain from compression fractures  Preparation for the injection:  5. Do not eat any solid food or dairy products within 8 hours of your appointment.  6. You may drink clear liquids up to 3 hours before appointment.  Clear liquids include water, black coffee, juice or soda.  No milk or cream please. 7. You may take your regular medication, including pain medications, with a sip of water before your appointment  Diabetics should hold regular insulin (if taken separately) and take 1/2 normal NPH dos the morning of the procedure.  Carry some   sugar containing items with you to your appointment. 8. A driver must accompany you and be prepared to drive you home after your procedure.  9. Bring all your current medications with your. 10. An IV may be inserted and sedation may be given at the discretion of the physician.   11. A blood pressure cuff, EKG and other monitors will often be applied during the procedure.  Some patients may need to have extra oxygen administered for a short period. 12. You will be asked to provide medical information, including your allergies, prior to the procedure.  We must know immediately if you are taking blood thinners (like Coumadin/Warfarin)  Or if you are allergic to IV iodine contrast (dye). We must know if you could possible be pregnant.  Possible side-effects:  Bleeding from needle site  Infection (rare, may require  surgery)  Nerve injury (rare)  Numbness & tingling (temporary)  Difficulty urinating (rare, temporary)  Spinal headache ( a headache worse with upright posture)  Light -headedness (temporary)  Pain at injection site (several days)  Decreased blood pressure (temporary)  Weakness in arm/leg (temporary)  Pressure sensation in back/neck (temporary)  Call if you experience:  Fever/chills associated with headache or increased back/neck pain.  Headache worsened by an upright position.  New onset weakness or numbness of an extremity below the injection site  Hives or difficulty breathing (go to the emergency room)  Inflammation or drainage at the infection site  Severe back/neck pain  Any new symptoms which are concerning to you  Please note:  Although the local anesthetic injected can often make your back or neck feel good for several hours after the injection, the pain will likely return.  It takes 3-7 days for steroids to work in the epidural space.  You may not notice any pain relief for at least that one week.  If effective, we will often do a series of three injections spaced 3-6 weeks apart to maximally decrease your pain.  After the initial series, we generally will wait several months before considering a repeat injection of the same type.  If you have any questions, please call [phone] [facility] 

## 2015-10-24 NOTE — Telephone Encounter (Signed)
Instructed Shatoya to bring patient in this afternoon for appointment.

## 2015-10-24 NOTE — Telephone Encounter (Signed)
Pt is out of meds pt was suppose to come in on one of the days that Dr. Laban EmperorNaveira was out. Please let me know what I can do to help this pt be seen so she can get her medications. The schedule is full. Pt was crying when she called. Pt is scheduled to come in on May 25th @ 2:40. Pt will be calling back by 2 today if no one has called her back

## 2015-10-24 NOTE — Progress Notes (Signed)
Safety precautions to be maintained throughout the outpatient stay will include: orient to surroundings, keep bed in low position, maintain call bell within reach at all times, provide assistance with transfer out of bed and ambulation. Oxycodone pill count #0/180  Filled 09-23-15

## 2015-11-01 LAB — TOXASSURE SELECT 13 (MW), URINE

## 2015-11-03 ENCOUNTER — Encounter: Payer: Medicaid Other | Admitting: Pain Medicine

## 2015-11-23 ENCOUNTER — Encounter: Payer: Self-pay | Admitting: Pain Medicine

## 2015-11-23 NOTE — Progress Notes (Signed)
Quick Note:  NOTE: This forensic urine drug screen (UDS) test was conducted using a state-of-the-art ultra high performance liquid chromatography and mass spectrometry system (UPLC/MS-MS), the most sophisticated and accurate method available. UPLC/MS-MS is 1,000 times more precise and accurate than standard gas chromatography and mass spectrometry (GC/MS). This system can analyze 26 drug categories and 180 drug compounds.  Unreported substance: Morphine  The findings of this UDT were reported as abnormal due to inconsistencies with expected results. An unreported substance was identified in the sample. Expectations were based on the medication history provided by the patient at the time of sample collection.  These results may suggest one of the following possibilities:  1). The use of multiple providers, suggesting the illegal practice of "Doctor Shopping", in violation of [place] Statutes, as well as our medication agreement.  2). The use of unsanctioned and possibly illegal substances, in violation of [employer] Statutes, as well as our medication agreement. 3). Inaccurate list of reported substances. ______ 

## 2016-01-04 ENCOUNTER — Encounter: Payer: Medicaid Other | Admitting: Pain Medicine

## 2016-01-04 NOTE — Progress Notes (Deleted)
Patient's Name: Brenda Berry  Patient type: Established  MRN: [id number]  Service setting: Ambulatory outpatient  DOB: 26-Dec-1970  Location: [facility]  DOS: 01/04/2016  Primary Care Physician: Otilio Miu, MD  Note by: Kathlen Brunswick. Dossie Arbour, M.D, DABA, DABAPM, DABPM, DABIPP, FIPP  Referring Physician: Juline Patch, MD  Specialty: Board-Certified Interventional Pain Management  Last Visit to Pain Management: 10/24/2015   Primary Reason(s) for Visit: Encounter for prescription drug management & post-procedure evaluation of chronic illness with mild to moderate exacerbation(Level of risk: moderate) CC: No chief complaint on file.   HPI  Brenda Berry is a 45 y.o. year old, female patient, who returns today as an established patient. She has Chronic pain; Long term current use of opiate analgesic; Long term prescription opiate use; Opiate use (90 MME/Day); Opiate dependence ([place]); Encounter for therapeutic drug level monitoring; Current tobacco use; Chronic low back pain (Location of Primary Source of Pain) (Bilateral) (L>R); Lumbar spondylosis; Chronic radicular Cervical pain (Right C6 Dermatomal distribution); Cervical spondylosis; Chronic radicular Lumbar pain (Location of Tertiary source of pain) (Right S1 Dermatomal pain); Allergy to contrast media (used for diagnostic x-rays); Failed back surgical syndrome; Epidural fibrosis; Musculoskeletal pain; Neurogenic pain; Neuropathic pain; Bulging lumbar disc (L3-4, L4-5, and L5-S1); Lumbar disc herniation with radiculopathy (large L4-5 disc herniated nucleus pulposus with central and right paracentral extrusion); Chronic pelvic pain in female; History of endometriosis; Sacrococcygeal pain (tailbone pain/sacrococcygeal neuralgia); Coccygodynia; Herniated cervical disc (C4-5 and C5-6); Tobacco abuse; Chronic obstructive pulmonary disease (COPD) ([place]); Urinary incontinence; History of bronchitis; Hiatal hernia; Pain due to interstitial  cystitis; History of recurrent UTI (urinary tract infection); History of nephrolithiasis; History of hematuria; Osteoarthrosis; Status post hysterectomy; Lumbar facet syndrome (Bilateral) (R>L); and Chronic neck pain (Location of Secondary source of pain) (Bilateral) (R>L) on her problem list.. Her primarily concern today is the No chief complaint on file.   Pain Assessment: Self-Reported               Reported level is compatible with observation          The patient comes into the clinics today for post-procedure evaluation on the interventional treatment done on 10/24/2015. In addition, she comes in today for pharmacological management of her chronic pain.  The patient  reports that she does not use drugs. Abnormal 10/24/2015 UDS (+) Morphine. No prescriptions for morphine found on the  [employer].       Controlled Substance Pharmacotherapy Assessment & REMS (Risk Evaluation and Mitigation Strategy)  Analgesic: *** MME/day: *** mg/day.  Pill Count: *** Pharmacokinetics: Onset of action (Liberation/Absorption): Within expected pharmacological parameters Time to Peak effect (Distribution): Timing and results are as within normal expected parameters Duration of action (Metabolism/Excretion): Within normal limits for medication Pharmacodynamics: Analgesic Effect: More than 50% Activity Facilitation: Medication(s) allow patient to sit, stand, walk, and do the basic ADLs Perceived Effectiveness: Described as relatively effective, allowing for increase in activities of daily living (ADL) Side-effects or Adverse reactions: None reported Monitoring: [place] PMP: Online review of the past 52-monthperiod conducted. Non-compliant. Abnormal 10/24/2015 UDS (+) Morphine. No prescriptions for morphine found on the  [employer] Last UDS on record: [employer] Select 13  Date Value Ref Range Status  10/24/2015 FINAL   Final    Comment:    ==================================================================== TOXASSURE SELECT 13 (MW) ==================================================================== Specimen Alert Note:  Urinary creatinine is low; ability to detect some drugs may be compromised.  Interpret  results with caution. ==================================================================== Test                             Result       Flag       Units Drug Present and Declared for Prescription Verification   Oxycodone                      3000         EXPECTED   ng/mg creat   Oxymorphone                    1937         EXPECTED   ng/mg creat   Noroxycodone                   3063         EXPECTED   ng/mg creat    Sources of oxycodone include scheduled prescription medications.    Oxymorphone and noroxycodone are expected metabolites of    oxycodone. Oxymorphone is also available as a scheduled    prescription medication. Drug Present not Declared for Prescription Verification   Morphine                       1832         UNEXPECTED ng/mg creat    Potential sources of morphine include administration of codeine    or morphine, use of heroin, or ingestion of poppy seeds. ==================================================================== Test                      Result    Flag   Units      Ref Range   Creatinine              19        L      mg/dL      >=20 ==================================================================== Declared Medications:  The flagging and interpretation on this report are based on the  following declared medications.  Unexpected results may arise from  inaccuracies in the declared medications.  **Note: The testing scope of this panel includes these medications:  Oxycodone  **Note: The testing scope of this panel does not include following  reported medications:  Acetaminophen (Tylenol)  Albuterol (Proventil)  Cyclobenzaprine (Flexeril)  Gabapentin  (Neurontin) ==================================================================== For clinical consultation, please call [phone]. ====================================================================    UDS interpretation: Non-Compliant          Abnormal 10/24/2015 UDS (+) Morphine. No prescriptions for morphine found on the  [employer]. Medication Assessment Form: Reviewed. Patient indicates being compliant with therapy Treatment compliance: Compliant Risk Assessment: Aberrant Behavior: None observed today Substance Use Disorder (SUD) Risk Level: Low Risk of opioid abuse or dependence: 0.7-3.0% with doses ? 36 MME/day and 6.1-26% with doses ? 120 MME/day. Opioid Risk Tool (ORT) Score:      Depression Scale Score: PHQ-2:         PHQ-9:          Pharmacologic Plan: No change in therapy, at this time  Post-Procedure Assessment  Procedure done on last visit: *** Side-effects or Adverse reactions: None reported Sedation: Please see nurses note  Results:    Analgesia during this period is likely to be Local Anesthetic and/or IV Sedative (Analgesic/Anxiolitic) related   Complete relief would confirms area to be the source of pain  Long-term benefit would suggest an inflammatory etiology to the pain   Current Relief (Now):          Persistent relief would suggest effective anti-inflammatory effects from steroids Interpretation of Results: ***  Laboratory Chemistry  Inflammation Markers Lab Results  Component Value Date   ESRSEDRATE 8 07/25/2015   CRP 0.6 07/25/2015    Renal Function Lab Results  Component Value Date   BUN 6 07/25/2015   CREATININE 0.61 07/25/2015   GFRAA >60 07/25/2015   GFRNONAA >60 07/25/2015    Hepatic Function Lab Results  Component Value Date   AST 21 07/25/2015   ALT 18 07/25/2015   ALBUMIN 4.4 07/25/2015    Electrolytes Lab Results  Component Value Date   NA 140 07/25/2015   K 4.1  07/25/2015   CL 107 07/25/2015   CALCIUM 9.3 07/25/2015   MG 2.2 07/25/2015    Pain Modulating Vitamins No results found for: Maralyn Sago [id number], [id number], [id number], 25OHVITD1, 25OHVITD2, 25OHVITD3, VITAMINB12  Coagulation Parameters Lab Results  Component Value Date   PLT 230 12/22/2013    Cardiovascular Lab Results  Component Value Date   HGB 12.7 12/22/2013   HCT 37.7 12/22/2013    Note: Lab results reviewed.  Recent Diagnostic Imaging  Mr Cervical Spine Wo Contrast  Result Date: 05/27/2015 CLINICAL DATA:  Neck pain and bilateral arm pain, right greater than left. Arm and hand weakness. EXAM: MRI CERVICAL SPINE WITHOUT CONTRAST TECHNIQUE: Multiplanar, multisequence MR imaging of the cervical spine was performed. No intravenous contrast was administered. COMPARISON:  MRI dated 08/25/2012 FINDINGS: The cervical spinal cord and visualized intracranial structures are normal. There is no facet arthritis in the cervical spine. The patient does have slightly prominent lymph nodes in both sides of the neck. Largest lymph node is 11 mm in diameter on the right on image 4 of series 6. These lymph nodes were present on the prior except exam but have slightly diffusely enlarged. This is nonspecific. Craniocervical junction through C3-4:  Normal. C4-5: Resolution of the prominent soft disc protrusion central and slightly to the left on the prior exam. There is a small residual central disc bulge with no neural impingement. Widely patent neural foramina. C5-6: Tiny disc bulges to the right and left of midline with no neural impingement. Small central disc bulge present on the study of 03/04/2010 is no longer present. C6-7 through T2-3:  Normal. IMPRESSION: 1. Resolution of central disc protrusion at C4-5. 2. Minimal degenerative disc disease C4-5 and C5-6 with no neural impingement. Widely patent spinal canal and neural foramina. 3. Slight prominence of lymph nodes in both sides of the neck,  slightly increased since the prior exams. This is nonspecific. Electronically Signed   By: Lorriane Shire M.D.   On: 05/27/2015 09:15   Mr Lumbar Spine Wo Contrast  Result Date: 05/27/2015 CLINICAL DATA:  Chronic progressive low back pain and bilateral leg pain, right greater than left. Numbness and burning and tingling in both legs. EXAM: MRI LUMBAR SPINE WITHOUT CONTRAST TECHNIQUE: Multiplanar, multisequence MR imaging of the lumbar spine was performed. No intravenous contrast was administered. COMPARISON:  MRIs   Dated 01/08/2014 and 06/02/2012 FINDINGS: Normal conus tip at L1-2.  Normal paraspinal soft tissues. T11-12 through L3-4: No significant abnormality and no significant change. Minimal hypertrophy of the ligamentum flavum at L2-3 and L3-4. L4-5: Previous right laminectomy. Hypertrophy of the left ligamentum flavum. There is either scarring or a small chronic disc protrusion central and to the left disc which  appears essentially unchanged since the prior study. Neural foramina are widely patent. L5-S1:  Normal. IMPRESSION: 1. Postsurgical changes at L4-5 with what is most likely either scarring or a small chronic disc protrusion central and to the left, unchanged. The right nerve roots are not impinged upon. There could possibly be some neural impingement of the left L5 nerve. 2. No other significant abnormality. Electronically Signed   By: Lorriane Shire M.D.   On: 05/27/2015 08:58   Cervical Imaging: Cervical MR wo contrast:  Results for orders placed during the hospital encounter of 05/27/15  MR Cervical Spine Wo Contrast   Narrative CLINICAL DATA:  Neck pain and bilateral arm pain, right greater than left. Arm and hand weakness.  EXAM: MRI CERVICAL SPINE WITHOUT CONTRAST  TECHNIQUE: Multiplanar, multisequence MR imaging of the cervical spine was performed. No intravenous contrast was administered.  COMPARISON:  MRI dated 08/25/2012  FINDINGS: The cervical spinal cord and  visualized intracranial structures are normal. There is no facet arthritis in the cervical spine.  The patient does have slightly prominent lymph nodes in both sides of the neck. Largest lymph node is 11 mm in diameter on the right on image 4 of series 6. These lymph nodes were present on the prior except exam but have slightly diffusely enlarged. This is nonspecific.  Craniocervical junction through C3-4:  Normal.  C4-5: Resolution of the prominent soft disc protrusion central and slightly to the left on the prior exam. There is a small residual central disc bulge with no neural impingement. Widely patent neural foramina.  C5-6: Tiny disc bulges to the right and left of midline with no neural impingement. Small central disc bulge present on the study of 03/04/2010 is no longer present.  C6-7 through T2-3:  Normal.  IMPRESSION: 1. Resolution of central disc protrusion at C4-5. 2. Minimal degenerative disc disease C4-5 and C5-6 with no neural impingement. Widely patent spinal canal and neural foramina. 3. Slight prominence of lymph nodes in both sides of the neck, slightly increased since the prior exams. This is nonspecific.   Electronically Signed   By: Lorriane Shire M.D.   On: 05/27/2015 09:15    Cervical MR wo contrast:  Results for orders placed in visit on 08/25/12  [place] W/O [facility]   Narrative **** PRIOR REPORT IMPORTED FROM AN EXTERNAL SYSTEM ****   PRIOR REPORT IMPORTED FROM THE [employer] WORKFLOW SYSTEM   REASON FOR EXAM:    Neck pain and cervical radiculitis DDD  COMMENTS:   PROCEDURE:     MMR - MMR CERVICAL SPINE WO CONT  - Aug 25 2012  3:19PM   RESULT:     MRI cervical spine   Comparison:  None   Indication: Neck pain   Technique: Multiplanar and multisequence MR imaging of the cervical spine  was performed without contrast.   Findings:   The cervical cord is normal in size and signal. The cervical spine is  normal  in lordotic alignment,  without listhesis. Bone marrow signal is normal.  Cerebellar tonsils are normal in position. Vertebral body heights are  maintained.   C2-3: No significant disc bulge, central canal stenosis, or foraminal  narrowing.   C3-4:  No significant disc bulge, central canal stenosis, or foraminal  narrowing.   C4-5: Large central/left paracentral disc protrusion which abuts and  slightly deforms the left paracentral cervical spinal cord. No foraminal  stenosis.   C5-6:  No significant disc bulge, central canal stenosis, or foraminal  narrowing.   C6-7:  No significant disc bulge, central canal stenosis, or foraminal  narrowing.   C7-T1: No significant disc bulge, central canal stenosis, or foraminal  narrowing.   IMPRESSION:   1. At C4-C5 there is a large central/left paracentral disc protrusion  which  abuts and slightly deforms the left paracentral cervical spinal cord.   Dictation Site: 1       Cervical MR w/wo contrast: No results found for this or any previous visit. Cervical MR w contrast: No results found for this or any previous visit. Cervical CT wo contrast: No results found for this or any previous visit. Cervical CT w/wo contrast: No results found for this or any previous visit. Cervical CT w/wo contrast: No results found for this or any previous visit. Cervical CT w contrast: No results found for this or any previous visit. Cervical CT outside: No results found for this or any previous visit. Cervical DG 2-3 views: No results found for this or any previous visit. Cervical DG F/E views: No results found for this or any previous visit. Cervical DG Bending/F/E views: No results found for this or any previous visit. Cervical DG complete: No results found for this or any previous visit. Cervical DG Myelogram views: No results found for this or any previous visit. Cervical DG Myelogram views: No results found for this or any previous visit.  Shoulder Imaging: Shoulder-R  MR w contrast: No results found for this or any previous visit. Shoulder-L MR w contrast: No results found for this or any previous visit. Shoulder-R MR w/wo contrast: No results found for this or any previous visit. Shoulder-L MR w/wo contrast: No results found for this or any previous visit. Shoulder-R MR wo contrast: No results found for this or any previous visit. Shoulder-L MR wo contrast: No results found for this or any previous visit. Shoulder-R CT w contrast: No results found for this or any previous visit. Shoulder-L CT w contrast: No results found for this or any previous visit. Shoulder-R CT w/wo contrast: No results found for this or any previous visit. Shoulder-L CT w/wo contrast: No results found for this or any previous visit. Shoulder-R CT wo contrast: No results found for this or any previous visit. Shoulder-L CT wo contrast: No results found for this or any previous visit. Shoulder-R DG Arthrogram: No results found for this or any previous visit. Shoulder-L DG Arthrogram: No results found for this or any previous visit. Shoulder-R DG 1 view: No results found for this or any previous visit. Shoulder-L DG 1 view: No results found for this or any previous visit. Shoulder-R DG: No results found for this or any previous visit. Shoulder-L DG: No results found for this or any previous visit.  Thoracic Imaging: Thoracic MR wo contrast: No results found for this or any previous visit. Thoracic MR wo contrast: No results found for this or any previous visit. Thoracic MR w/wo contrast: No results found for this or any previous visit. Thoracic MR w contrast: No results found for this or any previous visit. Thoracic CT wo contrast: No results found for this or any previous visit. Thoracic CT w/wo contrast: No results found for this or any previous visit. Thoracic CT w/wo contrast: No results found for this or any previous visit. Thoracic CT w contrast: No results found for this or  any previous visit. Thoracic DG 2-3 views: No results found for this or any previous visit. Thoracic DG 4 views: No results found for this or  any previous visit. Thoracic DG: No results found for this or any previous visit. Thoracic DG w/swimmers view: No results found for this or any previous visit. Thoracic DG Myelogram views: No results found for this or any previous visit. Thoracic DG Myelogram views: No results found for this or any previous visit.  Lumbosacral Imaging: Lumbar MR wo contrast:  Results for orders placed during the hospital encounter of 05/27/15  MR Lumbar Spine Wo Contrast   Narrative CLINICAL DATA:  Chronic progressive low back pain and bilateral leg pain, right greater than left. Numbness and burning and tingling in both legs.  EXAM: MRI LUMBAR SPINE WITHOUT CONTRAST  TECHNIQUE: Multiplanar, multisequence MR imaging of the lumbar spine was performed. No intravenous contrast was administered.  COMPARISON:  MRIs   Dated 01/08/2014 and 06/02/2012  FINDINGS: Normal conus tip at L1-2.  Normal paraspinal soft tissues.  T11-12 through L3-4: No significant abnormality and no significant change. Minimal hypertrophy of the ligamentum flavum at L2-3 and L3-4.  L4-5: Previous right laminectomy. Hypertrophy of the left ligamentum flavum. There is either scarring or a small chronic disc protrusion central and to the left disc which appears essentially unchanged since the prior study. Neural foramina are widely patent.  L5-S1:  Normal.  IMPRESSION: 1. Postsurgical changes at L4-5 with what is most likely either scarring or a small chronic disc protrusion central and to the left, unchanged. The right nerve roots are not impinged upon. There could possibly be some neural impingement of the left L5 nerve. 2. No other significant abnormality.   Electronically Signed   By: Lorriane Shire M.D.   On: 05/27/2015 08:58    Lumbar MR wo contrast:  Results for orders  placed in visit on 01/08/14  [place] W/O C[facility]   Narrative **** PRIOR REPORT IMPORTED FROM AN EXTERNAL SYSTEM ****   CLINICAL DATA:  Chronic low back pain acutely worsened over the past  4 weeks. Pain radiates into both legs and feet. History of prior  surgery.   EXAM:  MRI LUMBAR SPINE WITHOUT CONTRAST   TECHNIQUE:  Multiplanar, multisequence MR imaging of the lumbar spine was  performed. No intravenous contrast was administered.   COMPARISON:  MRI lumbar spine 06/02/2012.   FINDINGS:  Vertebral body height, signal and alignment are maintained. The  conus medullaris is normal in signal and position. Convex right  scoliosis is again seen. Imaged paraspinous structures are  unremarkable.   The T12-L1 level is imaged in the sagittal plane only and negative.   L1-2:  Negative.   L2-3: No change in a mild broad-based disc bulge without central  canal or foraminal narrowing.   L3-4: Mild disc bulge with some facet arthropathy and ligamentum  flavum thickening appear unchanged. There is mild central canal  narrowing. The foramina are open.   L4-5: Right laminotomy defect is again identified. Ligamentum flavum  thickening on the left is present and there is a small central and  eccentric to the left disc protrusion. The central spinal canal and  foramina appear open. The appearance is unchanged.   L5-S1: Minimal disc bulge without central canal foraminal narrowing.   IMPRESSION:  No change in the appearance of the lumbar spine.   Status post right laminotomy at L4-5 for discectomy. Small disc  bulge centrally and to the left without central canal or foraminal  stenosis appears unchanged.   Disc bulge causes mild central canal narrowing at L3-4 without nerve  root encroachment.  Electronically Signed    By: Inge Rise M.D.    On: 01/08/2014 12:50       Lumbar MR w/wo contrast:  Results for orders placed in visit on 06/02/12  MR Lumbar Spine W Wo  Contrast   Narrative **** PRIOR REPORT IMPORTED FROM AN EXTERNAL SYSTEM ****   PRIOR REPORT IMPORTED FROM THE [employer] WORKFLOW SYSTEM   REASON FOR EXAM:    low back pain lumbar radiculitis eval for epidrual  fibrosis  COMMENTS:   PROCEDURE:     MMR - MMR LUMBAR SPINE WO/W  - Jun 02 2012  1:56PM   RESULT:     MRI LUMBAR SPINE WITHOUT AND WITH CONTRAST   HISTORY: Low back pain   COMPARISON: None   TECHNIQUE: Standard MRI sequences of the lumbar spine were obtained both  pre- and post-administration of 10 ml of intravenous Magnevist.   FINDINGS:   The vertebral bodies of the lumbar spine are normal in size and alignment.  There is normal bone marrow signal demonstrated throughout the vertebra.  There no areas of abnormal enhancement. The intervertebral disc spaces are  well-maintained.   The spinal cord is of normal volume and contour. The cord terminates  normally at a L1. The nerve roots of the cauda equina and the filum  terminale have the usual appearance.   The visualized portions of the SI joints are unremarkable.   The imaged intra-abdominal contents are unremarkable.   T12-L1: No significant disc bulge. No evidence of neural foraminal or  central stenosis.   L1-L2: Minimal broad-based disc bulge. No evidence of neural foraminal or  central stenosis.   L2-L3: Minimal broad-based disc bulge. No evidence of neural foraminal or  central stenosis.   L3-L4: Mild broad-based disc bulge. No evidence of neural foraminal or  central stenosis.   L4-L5: Mild broad-based disc bulge. Right L5 keyhole laminectomy. No  evidence of neural foraminal or central stenosis.   L5-S1: No significant disc bulge. There is intermediate signal material  without significant enhancement along the right lateral aspect of the  thecal  sac likely representing fibrosis. No evidence of neural foraminal or  central  stenosis.   IMPRESSION:   1. Lumbar spine spondylosis as described above.    Dictation Site: 1       Lumbar MR w contrast: No results found for this or any previous visit. Lumbar CT wo contrast: No results found for this or any previous visit. Lumbar CT w/wo contrast: No results found for this or any previous visit. Lumbar CT w/wo contrast: No results found for this or any previous visit. Lumbar CT w contrast: No results found for this or any previous visit. Lumbar DG 1V: No results found for this or any previous visit. Lumbar DG 1V (Clearing): No results found for this or any previous visit. Lumbar DG 2-3V (Clearing): No results found for this or any previous visit. Lumbar DG 2-3 views: No results found for this or any previous visit. Lumbar DG (Complete) 4+V: No results found for this or any previous visit. Lumbar DG F/E views: No results found for this or any previous visit. Lumbar DG Bending views: No results found for this or any previous visit. Lumbar DG Myelogram views: No results found for this or any previous visit. Lumbar DG Myelogram Lumbosacral: No results found for this or any previous visit. Lumbar DG Diskogram views: No results found for this or any previous visit. Lumbar DG Epidurogram DG: No results found for this  or any previous visit. Lumbar DG Epidurogram IP: No results found for this or any previous visit.  Sacroiliac Joint Imaging: Sacroiliac Joint DG: No results found for this or any previous visit. Sacroiliac Joint MR w/wo contrast: No results found for this or any previous visit. Sacroiliac Joint MR wo contrast: No results found for this or any previous visit.  Spine Imaging: Whole Spine DG Myelogram views: No results found for this or any previous visit. Whole Spine MR Mets screen: No results found for this or any previous visit. Whole Spine MR w/wo: No results found for this or any previous visit. MRA Spinal Canal w/ cm: No results found for this or any previous visit. MRA Spinal Canal wo/ cm: No results found for this or any  previous visit. MRA Spinal Canal w/wo cm: No results found for this or any previous visit. Spine Outside MR Films: No results found for this or any previous visit. Spine Outside CT Films: No results found for this or any previous visit.  Hip Imaging: Hip-R MR w contrast: No results found for this or any previous visit. Hip-L MR w contrast: No results found for this or any previous visit. Hip-R MR w/wo contrast: No results found for this or any previous visit. Hip-L MR w/wo contrast: No results found for this or any previous visit. Hip-R MR wo contrast: No results found for this or any previous visit. Hip-L MR wo contrast: No results found for this or any previous visit. Hip-R CT w contrast: No results found for this or any previous visit. Hip-L CT w contrast: No results found for this or any previous visit. Hip-R CT w/wo contrast: No results found for this or any previous visit. Hip-L CT w/wo contrast: No results found for this or any previous visit. Hip-R CT wo contrast: No results found for this or any previous visit. Hip-L CT wo contrast: No results found for this or any previous visit. Hip-R DG 2-3 views: No results found for this or any previous visit. Hip-L DG 2-3 views: No results found for this or any previous visit. Hip-R DG Arthrogram: No results found for this or any previous visit. Hip-L DG Arthrogram: No results found for this or any previous visit. Hip-B DG Bilateral: No results found for this or any previous visit.  Knee Imaging: Knee-R MR w contrast: No results found for this or any previous visit. Knee-L MR w contrast: No results found for this or any previous visit. Knee-R MR w/wo contrast: No results found for this or any previous visit. Knee-L MR w/wo contrast: No results found for this or any previous visit. Knee-R MR wo contrast: No results found for this or any previous visit. Knee-L MR wo contrast: No results found for this or any previous visit. Knee-R CT w  contrast: No results found for this or any previous visit. Knee-L CT w contrast: No results found for this or any previous visit. Knee-R CT w/wo contrast: No results found for this or any previous visit. Knee-L CT w/wo contrast: No results found for this or any previous visit. Knee-R CT wo contrast: No results found for this or any previous visit. Knee-L CT wo contrast: No results found for this or any previous visit. Knee-R DG 1-2 views: No results found for this or any previous visit. Knee-L DG 1-2 views: No results found for this or any previous visit. Knee-R DG 3 views: No results found for this or any previous visit. Knee-L DG 3 views: No results  found for this or any previous visit. Knee-R DG 4 views: No results found for this or any previous visit. Knee-L DG 4 views: No results found for this or any previous visit. Knee-R DG Arthrogram: No results found for this or any previous visit. Knee-L DG Arthrogram: No results found for this or any previous visit.  Note: Imaging results reviewed.  Meds  The patient has a current medication list which includes the following prescription(s): acetaminophen, albuterol, cyclobenzaprine, gabapentin, gabapentin, oxycodone hcl, oxycodone hcl, and oxycodone hcl.  Current Outpatient Prescriptions on File Prior to Visit  Medication Sig  . acetaminophen (TYLENOL) 325 MG tablet Take 650 mg by mouth.  Marland Kitchen albuterol (PROVENTIL HFA;VENTOLIN HFA) 108 (90 BASE) MCG/ACT inhaler Inhale 2 puffs into the lungs every 6 (six) hours as needed for wheezing or shortness of breath.  . cyclobenzaprine (FLEXERIL) 10 MG tablet Take 1 tablet (10 mg total) by mouth daily as needed for muscle spasms.  Marland Kitchen gabapentin (NEURONTIN) 100 MG capsule Take 1 capsule (100 mg total) by mouth 4 (four) times daily.  Marland Kitchen gabapentin (NEURONTIN) 600 MG tablet Take 1 tablet (600 mg total) by mouth every 6 (six) hours.  . Oxycodone HCl 10 MG TABS Take 1 tablet (10 mg total) by mouth every 4 (four)  hours as needed.  . Oxycodone HCl 10 MG TABS Take 1 tablet (10 mg total) by mouth every 4 (four) hours as needed.  . Oxycodone HCl 10 MG TABS Take 1 tablet (10 mg total) by mouth every 4 (four) hours as needed.   No current facility-administered medications on file prior to visit.     ROS  Constitutional: Denies any fever or chills Gastrointestinal: No reported hemesis, hematochezia, vomiting, or acute GI distress Musculoskeletal: Denies any acute onset joint swelling, redness, loss of ROM, or weakness Neurological: No reported episodes of acute onset apraxia, aphasia, dysarthria, agnosia, amnesia, paralysis, loss of coordination, or loss of consciousness  Allergies  Ms. Digilio is allergic to ciprofloxacin; demerol [meperidine]; ibuprofen; iodinated diagnostic agents; latex; nifedipine; sulfa antibiotics; and terbutaline.  [place]  Medical:  Ms. Amster  has a past medical history of Arthritis; Degenerative disc disease, cervical; Degenerative disc disease, lumbar; Hiatal hernia (04/26/2015); History of bronchitis (04/26/2015); History of hematuria (04/26/2015); History of nephrolithiasis (04/26/2015); History of recurrent UTI (urinary tract infection) (04/26/2015); Interstitial cystitis; and Status post hysterectomy (04/26/2015). Family: family history includes Diabetes in her mother; Heart disease in her father; Hyperlipidemia in her mother; Hypertension in her mother. Surgical:  has a past surgical history that includes Back surgery; Vaginal hysterectomy; and Bladder surgery. Tobacco:  reports that she has been smoking.  She does not have any smokeless tobacco history on file. Alcohol:  reports that she does not drink alcohol. Drug:  reports that she does not use drugs.  Constitutional Exam  Vitals: There were no vitals taken for this visit. General appearance: Well nourished, well developed, and well hydrated. In no acute distress Calculated BMI/Body habitus: There is no height or weight on  file to calculate BMI.       Psych/Mental status: Alert and oriented x 3 (person, place, & time) Eyes: PERLA Respiratory: No evidence of acute respiratory distress  Cervical Spine Exam  Inspection: No masses, redness, or swelling Alignment: Symmetrical ROM: Functional: ROM is within functional limits [facility]) Stability: No instability detected Muscle strength & Tone: Functionally intact Sensory: Unimpaired Palpation: No complaints of tenderness  Upper Extremity (UE) Exam    Side: Right upper extremity  Side: Left upper  extremity  Inspection: No masses, redness, swelling, or asymmetry  Inspection: No masses, redness, swelling, or asymmetry  ROM:  ROM:  Functional: ROM is within functional limits [facility])        Functional: ROM is within functional limits [facility])        Muscle strength & Tone: Functionally intact  Muscle strength & Tone: Functionally intact  Sensory: Unimpaired  Sensory: Unimpaired  Palpation: No complaints of tenderness  Palpation: No complaints of tenderness   Thoracic Spine Exam  Inspection: No masses, redness, or swelling Alignment: Symmetrical ROM: Functional: ROM is within functional limits [facility]) Stability: No instability detected Sensory: Unimpaired Muscle strength & Tone: Functionally intact Palpation: No complaints of tenderness  Lumbar Spine Exam  Inspection: No masses, redness, or swelling Alignment: Symmetrical ROM: Functional: ROM is within functional limits [facility]) Stability: No instability detected Muscle strength & Tone: Functionally intact Sensory: Unimpaired Palpation: No complaints of tenderness Provocative Tests: Lumbar Hyperextension and rotation test: provocative test deferred today       Patrick's Maneuver: provocative test deferred today              Gait & Posture Assessment  Ambulation: Unassisted Gait: Unaffected Posture: WNL   Lower Extremity Exam    Side: Right lower extremity  Side: Left lower extremity  Inspection: No masses,  redness, swelling, or asymmetry ROM:  Inspection: No masses, redness, swelling, or asymmetry ROM:  Functional: ROM is within functional limits [facility])        Functional: ROM is within functional limits [facility])        Muscle strength & Tone: Functionally intact  Muscle strength & Tone: Functionally intact  Sensory: Unimpaired  Sensory: Unimpaired  Palpation: No complaints of tenderness  Palpation: No complaints of tenderness   Assessment & Plan  Primary Diagnosis & Pertinent Problem List: The primary encounter diagnosis was Chronic pain. Diagnoses of Long term current use of opiate analgesic, Opiate use (90 MME/Day), Encounter for therapeutic drug level monitoring, Chronic low back pain (Location of Primary Source of Pain) (Bilateral) (L>R), Chronic neck pain (Location of Secondary source of pain) (Bilateral) (R>L), Musculoskeletal pain, and Neurogenic pain were also pertinent to this visit.  Visit Diagnosis: 1. Chronic pain   2. Long term current use of opiate analgesic   3. Opiate use (90 MME/Day)   4. Encounter for therapeutic drug level monitoring   5. Chronic low back pain (Location of Primary Source of Pain) (Bilateral) (L>R)   6. Chronic neck pain (Location of Secondary source of pain) (Bilateral) (R>L)   7. Musculoskeletal pain   8. Neurogenic pain     Problems updated and reviewed during this visit: No problems updated.  Problem-specific Plan(s): No problem-specific Assessment & Plan notes found for this encounter.  No new Assessment & Plan notes have been filed under this hospital service since the last note was generated. Service: Pain Management   Plan of Care   Problem List Items Addressed This Visit      High   Chronic low back pain (Location of Primary Source of Pain) (Bilateral) (L>R) (Chronic)   Chronic neck pain (Location of Secondary source of pain) (Bilateral) (R>L) (Chronic)   Chronic pain - Primary (Chronic)   Musculoskeletal pain (Chronic)   Neurogenic pain  (Chronic)     Medium   Encounter for therapeutic drug level monitoring   Long term current use of opiate analgesic (Chronic)   Opiate use (90 MME/Day) (Chronic)    Other Visit Diagnoses   None.  Pharmacotherapy (Medications Ordered): No orders of the defined types were placed in this encounter.   Lab-work & Procedure Ordered: No orders of the defined types were placed in this encounter.   Imaging Ordered: None  Interventional Therapies: Scheduled:  ***   Considering:  ***   PRN Procedures:  ***   Referral(s) or Consult(s): None at this time.  New Prescriptions   No medications on file    Medications administered during this visit: Ms. Latella had no medications administered during this visit.  Requested PM Follow-up: No Follow-up on file.  Future Appointments Date Time Provider [place]  01/04/2016 8:20 AM Milinda Pointer, MD [facility] None    Primary Care Physician: Otilio Miu, MD Location: [facility] Note by: Kathlen Brunswick. Dossie Arbour, M.D, DABA, DABAPM, DABPM, DABIPP, FIPP  Pain Score Disclaimer: We use the NRS-11 scale. This is a self-reported, subjective measurement of pain severity with only modest accuracy. It is used primarily to identify changes within a particular patient. It must be understood that outpatient pain scales are significantly less accurate that those used for research, where they can be applied under ideal controlled circumstances with minimal exposure to variables. In reality, the score is likely to be a combination of pain intensity and pain affect, where pain affect describes the degree of emotional arousal or changes in action readiness caused by the sensory experience of pain. Factors such as social and work situation, setting, emotional state, anxiety levels, expectation, and prior pain experience may influence pain perception and show large inter-individual differences that may also be affected by  time variables.  Patient instructions provided at this appointment:: There are no Patient Instructions on file for this visit.

## 2016-01-26 ENCOUNTER — Encounter: Payer: Self-pay | Admitting: Pain Medicine

## 2016-01-26 ENCOUNTER — Ambulatory Visit: Payer: Medicaid Other | Attending: Pain Medicine | Admitting: Pain Medicine

## 2016-01-26 ENCOUNTER — Telehealth: Payer: Self-pay

## 2016-01-26 VITALS — BP 148/61 | HR 92 | Temp 98.8°F | Resp 16 | Ht 62.0 in | Wt 130.0 lb

## 2016-01-26 DIAGNOSIS — G8929 Other chronic pain: Secondary | ICD-10-CM | POA: Diagnosis not present

## 2016-01-26 DIAGNOSIS — Z79891 Long term (current) use of opiate analgesic: Secondary | ICD-10-CM | POA: Insufficient documentation

## 2016-01-26 DIAGNOSIS — Z87442 Personal history of urinary calculi: Secondary | ICD-10-CM | POA: Diagnosis not present

## 2016-01-26 DIAGNOSIS — M542 Cervicalgia: Secondary | ICD-10-CM

## 2016-01-26 DIAGNOSIS — G9619 Other disorders of meninges, not elsewhere classified: Secondary | ICD-10-CM | POA: Diagnosis not present

## 2016-01-26 DIAGNOSIS — J449 Chronic obstructive pulmonary disease, unspecified: Secondary | ICD-10-CM | POA: Diagnosis not present

## 2016-01-26 DIAGNOSIS — M792 Neuralgia and neuritis, unspecified: Secondary | ICD-10-CM | POA: Diagnosis not present

## 2016-01-26 DIAGNOSIS — M5412 Radiculopathy, cervical region: Secondary | ICD-10-CM | POA: Insufficient documentation

## 2016-01-26 DIAGNOSIS — M791 Myalgia: Secondary | ICD-10-CM | POA: Diagnosis not present

## 2016-01-26 DIAGNOSIS — M5116 Intervertebral disc disorders with radiculopathy, lumbar region: Secondary | ICD-10-CM | POA: Insufficient documentation

## 2016-01-26 DIAGNOSIS — M545 Low back pain, unspecified: Secondary | ICD-10-CM

## 2016-01-26 DIAGNOSIS — M50221 Other cervical disc displacement at C4-C5 level: Secondary | ICD-10-CM | POA: Diagnosis not present

## 2016-01-26 DIAGNOSIS — Z90710 Acquired absence of both cervix and uterus: Secondary | ICD-10-CM | POA: Diagnosis not present

## 2016-01-26 DIAGNOSIS — M47816 Spondylosis without myelopathy or radiculopathy, lumbar region: Secondary | ICD-10-CM

## 2016-01-26 DIAGNOSIS — R102 Pelvic and perineal pain: Secondary | ICD-10-CM | POA: Diagnosis not present

## 2016-01-26 DIAGNOSIS — M7918 Myalgia, other site: Secondary | ICD-10-CM

## 2016-01-26 DIAGNOSIS — M549 Dorsalgia, unspecified: Secondary | ICD-10-CM | POA: Diagnosis present

## 2016-01-26 DIAGNOSIS — M533 Sacrococcygeal disorders, not elsewhere classified: Secondary | ICD-10-CM | POA: Insufficient documentation

## 2016-01-26 DIAGNOSIS — R109 Unspecified abdominal pain: Secondary | ICD-10-CM | POA: Diagnosis present

## 2016-01-26 DIAGNOSIS — M5020 Other cervical disc displacement, unspecified cervical region: Secondary | ICD-10-CM

## 2016-01-26 DIAGNOSIS — M47896 Other spondylosis, lumbar region: Secondary | ICD-10-CM | POA: Diagnosis not present

## 2016-01-26 DIAGNOSIS — F17210 Nicotine dependence, cigarettes, uncomplicated: Secondary | ICD-10-CM | POA: Insufficient documentation

## 2016-01-26 DIAGNOSIS — M5126 Other intervertebral disc displacement, lumbar region: Secondary | ICD-10-CM | POA: Insufficient documentation

## 2016-01-26 MED ORDER — CYCLOBENZAPRINE HCL 10 MG PO TABS: 10.0000 mg | ORAL_TABLET | Freq: Every day | ORAL | 2 refills | Status: DC | PRN

## 2016-01-26 MED ORDER — GABAPENTIN 100 MG PO CAPS: 100.0000 mg | ORAL_CAPSULE | Freq: Every day | ORAL | 2 refills | Status: DC

## 2016-01-26 MED ORDER — GABAPENTIN 600 MG PO TABS: 600.0000 mg | ORAL_TABLET | Freq: Every day | ORAL | 2 refills | Status: DC

## 2016-01-26 MED ORDER — OXYCODONE HCL 5 MG PO TABS: ORAL_TABLET | ORAL | 0 refills | Status: DC

## 2016-01-26 NOTE — Progress Notes (Signed)
Patient's Name: Brenda Berry  Patient type: Established  MRN: [id number]  Service setting: Ambulatory outpatient  DOB: March 30, 1971  Location: [facility]  DOS: 01/26/2016  Primary Care Physician: Brenda Sauer, MD  Note by: Sydnee Levans. Laban Emperor, M.D  Referring Physician: Duanne Limerick, MD  Specialty: Interventional Pain Management  Last Visit to Pain Management: 10/24/2015   Primary Reason(s) for Visit: Encounter for prescription drug management (Level of risk: moderate) CC: Back Pain; Abdominal Pain (associated with bladder disease); and Neck Pain   HPI  [name] is a 45 y.o. year old, female patient, who returns today as an established patient. She has Chronic pain; Long term current use of opiate analgesic; Long term prescription opiate use; Opiate use (90 MME/Day); Opiate dependence (HCC); Encounter for therapeutic drug level monitoring; Current tobacco use; Chronic low back pain (Location of Primary Source of Pain) (Bilateral) (L>R); Lumbar spondylosis; Chronic radicular Cervical pain (Bilateral C6 Dermatomal distribution); Cervical spondylosis; Chronic radicular Lumbar pain (Location of Tertiary source of pain) (Right S1 Dermatomal pain); Allergy to contrast media (used for diagnostic x-rays); Failed back surgical syndrome (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); Epidural fibrosis; Musculoskeletal pain; Neurogenic pain; Neuropathic pain; Bulging lumbar disc (L3-4, L4-5, and L5-S1); Lumbar disc herniation with radiculopathy (large L4-5 disc herniated nucleus pulposus with central and right paracentral extrusion); Chronic pelvic pain in female; History of endometriosis; Sacrococcygeal pain (tailbone pain/sacrococcygeal neuralgia); Coccygodynia; Herniated cervical disc (C4-5 and C5-6); Tobacco abuse; Chronic obstructive pulmonary disease (COPD) (HCC); Urinary incontinence; History of bronchitis; Hiatal hernia; Pain due to interstitial cystitis; History of recurrent UTI (urinary tract infection);  History of nephrolithiasis; History of hematuria; Osteoarthrosis; Status post hysterectomy; Lumbar facet syndrome (Bilateral) (R>L); and Chronic neck pain (Location of Secondary source of pain) (Bilateral) (L>R) on her problem list.. Her primarily concern today is the Back Pain; Abdominal Pain (associated with bladder disease); and Neck Pain   Pain Assessment: Self-Reported Pain Score: 4              Reported level is compatible with observation       Pain Type: Chronic pain Pain Location: Back (abdominal pain and neck pain) Pain Orientation: Lower, Right (abdominal pain associated with bladder disease and patient feels it also affects back and goes through the top of legs, neck pain) Pain Descriptors / Indicators: Tingling, Numbness, Aching, Burning, Constant Pain Frequency: Constant  The patient comes into the clinics today for pharmacological management of her chronic pain. I last saw this patient on 10/24/2015. The patient  reports that she does not use drugs. Her body mass index is 23.78 kg/m.  Patient here for medication management.  Patient is scheduled tomorrow for oral surgery to remove all teeth and prepare for dentures.  Patient will be going to [facility] in [place]. Patient states that she has been having more pain on the left side of neck than she was previously experiencing. This pain has also caused persistant headaches that can be reduced by taking medication. The patient's 10/24/2015 UDS came back abnormal positive for morphine, which we did not find is being prescribed for her on her PMP (Prescription Monitoring Program) search. Today she was given an opportunity to explain how the morphine get into her system. She had no explanation for it. After I read for her where morphine could be coming from she indicated that it was possible that it was in her cough medicine. However, the [place] PMP does not have anybody recently prescribing cough medicine with codeine  for her. The last  time she had some was on 12/06/2014. She is well aware that we do not allow anyone to get cough medicine with either codeine or any other opioid, due to the risk of drug to drug interaction possibly causing respiratory depression and death. Today I have provided her with her last oxycodone prescription so that she can taper it down and stop it. She was given prescription for oxycodone IR 5 mg to take 1 tablet every 4 hours (6 per day) for a period of 4 days after which she is to decrease her daily dose by 1 tablet every 4 days until she can stop it completely. By doing a like this, she should find herself being weaned without any problems with withdrawal. The patient is pending some oral surgery tomorrow. It is very likely that the surgeon will give her some additional pain medicine for this.  Due to the fact that the patient was unable to explain the morphine in her UDS, today we have changed her treatment planned to exclude the use of any opioid analgesics. We will continue to offer this patient interventional options for her pain, depending on where her flareup is.  Date of Last Visit: 10/24/15 Service Provided on Last Visit: Med Refill, Evaluation  Controlled Substance Pharmacotherapy Assessment & REMS (Risk Evaluation and Mitigation Strategy)  Analgesic: Oxycodone IR 10 mg every 4 hours (60  Mg/day of oxycodone) MME/day: 90 mg/day Pill Count: Oxycodone HCL 10 mg 0/180 last fill 12/22/15 Pharmacokinetics: Onset of action (Liberation/Absorption): Within expected pharmacological parameters Time to Peak effect (Distribution): Timing and results are as within normal expected parameters Duration of action (Metabolism/Excretion): Within normal limits for medication Pharmacodynamics: Analgesic Effect: More than 50% Activity Facilitation: Medication(s) allow patient to sit, stand, walk, and do the basic ADLs Perceived Effectiveness: Described as relatively effective, allowing for increase in activities  of daily living (ADL) Side-effects or Adverse reactions: None reported Monitoring: [place] PMP: Online review of the past 45-month period conducted. Compliant with practice rules and regulations Last UDS on record: ToxAssure Select 13  Date Value Ref Range Status  10/24/2015 FINAL  Final    Comment:    ==================================================================== TOXASSURE SELECT 13 (MW) ==================================================================== Specimen Alert Note:  Urinary creatinine is low; ability to detect some drugs may be compromised.  Interpret results with caution. ==================================================================== Test                             Result       Flag       Units Drug Present and Declared for Prescription Verification   Oxycodone                      3000         EXPECTED   ng/mg creat   Oxymorphone                    1937         EXPECTED   ng/mg creat   Noroxycodone                   3063         EXPECTED   ng/mg creat    Sources of oxycodone include scheduled prescription medications.    Oxymorphone and noroxycodone are expected metabolites of    oxycodone. Oxymorphone is also available as a scheduled    prescription medication. Drug Present not  Declared for Prescription Verification   Morphine                       1832         UNEXPECTED ng/mg creat    Potential sources of morphine include administration of codeine    or morphine, use of heroin, or ingestion of poppy seeds. ==================================================================== Test                      Result    Flag   Units      Ref Range   Creatinine              19        L      mg/dL      >=16 ==================================================================== Declared Medications:  The flagging and interpretation on this report are based on the  following declared medications.  Unexpected results may arise from  inaccuracies in the declared  medications.  **Note: The testing scope of this panel includes these medications:  Oxycodone  **Note: The testing scope of this panel does not include following  reported medications:  Acetaminophen (Tylenol)  Albuterol (Proventil)  Cyclobenzaprine (Flexeril)  Gabapentin (Neurontin) ==================================================================== For clinical consultation, please call [phone]. ====================================================================    UDS interpretation: Non-Compliant Unexplained presence of morphine in the urine sample. Medication Assessment Form: Discrepancies found between patient's report and information collected. No explanations provided for the presence of the morphine. Treatment compliance: Non-compliant Risk Assessment: Aberrant Behavior: Obtaining medications from illicit sources, prescription drug misuse, non-compliance with medical instructions on the proper use of the medication, unsafe use of medication, non-compliance with practice rules and regulations and obtaining controlled sustances from inappropriate sources Substance Use Disorder (SUD) Risk Level: Very High Risk of opioid abuse or dependence: 0.7-3.0% with doses ? 36 MME/day and 6.1-26% with doses ? 120 MME/day. Opioid Risk Tool (ORT) Score: Total Score: 0 Low Risk for SUD (Score <3) Depression Scale Score: PHQ-2: PHQ-2 Total Score: 0 No depression (0) PHQ-9: PHQ-9 Total Score: 0 No depression (0-4)  Pharmacologic Plan: Discontinue opioid therapy  Laboratory Chemistry  Inflammation Markers Lab Results  Component Value Date   ESRSEDRATE 8 07/25/2015   CRP 0.6 07/25/2015    Renal Function Lab Results  Component Value Date   BUN 6 07/25/2015   CREATININE 0.61 07/25/2015   GFRAA >60 07/25/2015   GFRNONAA >60 07/25/2015    Hepatic Function Lab Results  Component Value Date   AST 21 07/25/2015   ALT 18 07/25/2015   ALBUMIN 4.4 07/25/2015    Electrolytes Lab  Results  Component Value Date   NA 140 07/25/2015   K 4.1 07/25/2015   CL 107 07/25/2015   CALCIUM 9.3 07/25/2015   MG 2.2 07/25/2015    Pain Modulating Vitamins No results found for: Jerry Caras [id number], [id number], [id number], 25OHVITD1, 25OHVITD2, 25OHVITD3, VITAMINB12  Coagulation Parameters Lab Results  Component Value Date   PLT 230 12/22/2013    Cardiovascular Lab Results  Component Value Date   HGB 12.7 12/22/2013   HCT 37.7 12/22/2013    Note: Lab results reviewed.  Recent Diagnostic Imaging  Mr Cervical Spine Wo Contrast  Result Date: 05/27/2015 CLINICAL DATA:  Neck pain and bilateral arm pain, right greater than left. Arm and hand weakness. EXAM: MRI CERVICAL SPINE WITHOUT CONTRAST TECHNIQUE: Multiplanar, multisequence MR imaging of the cervical spine was performed. No intravenous contrast was administered. COMPARISON:  MRI dated 08/25/2012 FINDINGS: The cervical spinal  cord and visualized intracranial structures are normal. There is no facet arthritis in the cervical spine. The patient does have slightly prominent lymph nodes in both sides of the neck. Largest lymph node is 11 mm in diameter on the right on image 4 of series 6. These lymph nodes were present on the prior except exam but have slightly diffusely enlarged. This is nonspecific. Craniocervical junction through C3-4:  Normal. C4-5: Resolution of the prominent soft disc protrusion central and slightly to the left on the prior exam. There is a small residual central disc bulge with no neural impingement. Widely patent neural foramina. C5-6: Tiny disc bulges to the right and left of midline with no neural impingement. Small central disc bulge present on the study of 03/04/2010 is no longer present. C6-7 through T2-3:  Normal. IMPRESSION: 1. Resolution of central disc protrusion at C4-5. 2. Minimal degenerative disc disease C4-5 and C5-6 with no neural impingement. Widely patent spinal canal and neural foramina. 3.  Slight prominence of lymph nodes in both sides of the neck, slightly increased since the prior exams. This is nonspecific. Electronically Signed   By: Francene Boyers M.D.   On: 05/27/2015 09:15   Mr Lumbar Spine Wo Contrast  Result Date: 05/27/2015 CLINICAL DATA:  Chronic progressive low back pain and bilateral leg pain, right greater than left. Numbness and burning and tingling in both legs. EXAM: MRI LUMBAR SPINE WITHOUT CONTRAST TECHNIQUE: Multiplanar, multisequence MR imaging of the lumbar spine was performed. No intravenous contrast was administered. COMPARISON:  MRIs   Dated 01/08/2014 and 06/02/2012 FINDINGS: Normal conus tip at L1-2.  Normal paraspinal soft tissues. T11-12 through L3-4: No significant abnormality and no significant change. Minimal hypertrophy of the ligamentum flavum at L2-3 and L3-4. L4-5: Previous right laminectomy. Hypertrophy of the left ligamentum flavum. There is either scarring or a small chronic disc protrusion central and to the left disc which appears essentially unchanged since the prior study. Neural foramina are widely patent. L5-S1:  Normal. IMPRESSION: 1. Postsurgical changes at L4-5 with what is most likely either scarring or a small chronic disc protrusion central and to the left, unchanged. The right nerve roots are not impinged upon. There could possibly be some neural impingement of the left L5 nerve. 2. No other significant abnormality. Electronically Signed   By: Francene Boyers M.D.   On: 05/27/2015 08:58    Meds  The patient has a current medication list which includes the following prescription(s): acetaminophen, albuterol, amitriptyline, hydroxyzine, cyclobenzaprine, gabapentin, gabapentin, and oxycodone.  Current Outpatient Prescriptions on File Prior to Visit  Medication Sig  . acetaminophen (TYLENOL) 325 MG tablet Take 650 mg by mouth every 4 (four) hours as needed.   Marland Kitchen albuterol (PROVENTIL HFA;VENTOLIN HFA) 108 (90 BASE) MCG/ACT inhaler Inhale 2  puffs into the lungs every 6 (six) hours as needed for wheezing or shortness of breath.   No current facility-administered medications on file prior to visit.     ROS  Constitutional: Denies any fever or chills Gastrointestinal: No reported hemesis, hematochezia, vomiting, or acute GI distress Musculoskeletal: Denies any acute onset joint swelling, redness, loss of ROM, or weakness Neurological: No reported episodes of acute onset apraxia, aphasia, dysarthria, agnosia, amnesia, paralysis, loss of coordination, or loss of consciousness  Allergies  Ms. Kimmet is allergic to ciprofloxacin; demerol [meperidine]; ibuprofen; iodinated diagnostic agents; latex; nifedipine; sulfa antibiotics; and terbutaline.  PFSH  Medical:  Ms. Wareing  has a past medical history of Arthritis; Degenerative disc disease, cervical; Degenerative  disc disease, lumbar; Hiatal hernia (04/26/2015); History of bronchitis (04/26/2015); History of hematuria (04/26/2015); History of nephrolithiasis (04/26/2015); History of recurrent UTI (urinary tract infection) (04/26/2015); Interstitial cystitis; and Status post hysterectomy (04/26/2015). Family: family history includes Diabetes in her mother; Heart disease in her father; Hyperlipidemia in her mother; Hypertension in her mother. Surgical:  has a past surgical history that includes Back surgery; Vaginal hysterectomy; and Bladder surgery. Tobacco:  reports that she has been smoking.  She has been smoking about 1.00 pack per day. She has never used smokeless tobacco. Alcohol:  reports that she does not drink alcohol. Drug:  reports that she does not use drugs.  Constitutional Exam  Vitals: Blood pressure (!) 148/61, pulse 92, temperature 98.8 F (37.1 C), temperature source Oral, resp. rate 16, height 5\' 2"  (1.575 m), weight 130 lb (59 kg), SpO2 100 %. General appearance: Well nourished, well developed, and well hydrated. In no acute distress Calculated BMI/Body habitus: Body  mass index is 23.78 kg/m. (18.5-24.9 kg/m2) Ideal body weight Psych/Mental status: Alert and oriented x 3 (person, place, & time) Eyes: PERLA Respiratory: No evidence of acute respiratory distress  Cervical Spine Exam  Inspection: No masses, redness, or swelling Alignment: Symmetrical Functional ROM: Diminished ROM Stability: No instability detected Muscle strength & Tone: Functionally intact Sensory: Movement-associated discomfort Palpation: Non-contributory  Upper Extremity (UE) Exam    Side: Right upper extremity  Side: Left upper extremity  Inspection: No masses, redness, swelling, or asymmetry  Inspection: No masses, redness, swelling, or asymmetry  Functional ROM: ROM appears unrestricted  Functional ROM: ROM appears unrestricted  Muscle strength & Tone: Functionally intact  Muscle strength & Tone: Functionally intact  Sensory: Unimpaired  Sensory: Unimpaired  Palpation: Non-contributory  Palpation: Non-contributory   Thoracic Spine Exam  Inspection: No masses, redness, or swelling Alignment: Symmetrical Functional ROM: ROM appears unrestricted Stability: No instability detected Sensory: Unimpaired Muscle strength & Tone: Functionally intact Palpation: Non-contributory  Lumbar Spine Exam  Inspection: Well healed scar from previous spine surgery detected Alignment: Symmetrical Functional ROM: Decreased ROM Stability: No instability detected Muscle strength & Tone: Functionally intact Sensory: Movement-associated pain Palpation: Complains of area being tender to palpation Provocative Tests: Lumbar Hyperextension and rotation test: Positive bilaterally for facet joint pain. Patrick's Maneuver: evaluation deferred today              Gait & Posture Assessment  Ambulation: Unassisted Gait: Relatively normal for age and body habitus Posture: WNL   Lower Extremity Exam    Side: Right lower extremity  Side: Left lower extremity  Inspection: No masses, redness,  swelling, or asymmetry  Inspection: No masses, redness, swelling, or asymmetry  Functional ROM: ROM appears unrestricted  Functional ROM: ROM appears unrestricted  Muscle strength & Tone: Functionally intact  Muscle strength & Tone: Functionally intact  Sensory: Unimpaired  Sensory: Unimpaired  Palpation: Non-contributory  Palpation: Non-contributory    Assessment & Plan  Primary Diagnosis & Pertinent Problem List: The primary encounter diagnosis was Chronic low back pain (Location of Primary Source of Pain) (Bilateral) (L>R). Diagnoses of Chronic pain, Musculoskeletal pain, Neurogenic pain, Chronic neck pain (Location of Secondary source of pain) (Bilateral) (R>L), Lumbar facet syndrome (Bilateral) (R>L), Chronic radicular Cervical pain (Bilateral C6 Dermatomal distribution), Lumbar disc herniation with radiculopathy (large L4-5 disc herniated nucleus pulposus with central and right paracentral extrusion), and Herniated cervical disc (C4-5 and C5-6) were also pertinent to this visit.  Visit Diagnosis: 1. Chronic low back pain (Location of Primary Source of Pain) (Bilateral) (L>R)  2. Chronic pain   3. Musculoskeletal pain   4. Neurogenic pain   5. Chronic neck pain (Location of Secondary source of pain) (Bilateral) (R>L)   6. Lumbar facet syndrome (Bilateral) (R>L)   7. Chronic radicular Cervical pain (Bilateral C6 Dermatomal distribution)   8. Lumbar disc herniation with radiculopathy (large L4-5 disc herniated nucleus pulposus with central and right paracentral extrusion)   9. Herniated cervical disc (C4-5 and C5-6)     Problems updated and reviewed during this visit: Problem  Chronic neck pain (Location of Secondary source of pain) (Bilateral) (L>R)  Failed back surgical syndrome (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Price)  Chronic radicular Cervical pain (Bilateral C6 Dermatomal distribution)    Problem-specific Plan(s): No problem-specific Assessment & Plan notes found for this encounter.  No new  Assessment & Plan notes have been filed under this hospital service since the last note was generated. Service: Pain Management   Plan of Care   Problem List Items Addressed This Visit      High   Chronic low back pain (Location of Primary Source of Pain) (Bilateral) (L>R) - Primary (Chronic)   Relevant Medications   cyclobenzaprine (FLEXERIL) 10 MG tablet   oxyCODONE (OXY IR/ROXICODONE) 5 MG immediate release tablet   Other Relevant Orders   Ambulatory referral to Neurosurgery   Chronic neck pain (Location of Secondary source of pain) (Bilateral) (L>R) (Chronic)   Relevant Medications   cyclobenzaprine (FLEXERIL) 10 MG tablet   gabapentin (NEURONTIN) 100 MG capsule   gabapentin (NEURONTIN) 600 MG tablet   amitriptyline (ELAVIL) 25 MG tablet   oxyCODONE (OXY IR/ROXICODONE) 5 MG immediate release tablet   Other Relevant Orders   Ambulatory referral to Neurosurgery   Chronic pain (Chronic)   Relevant Medications   cyclobenzaprine (FLEXERIL) 10 MG tablet   gabapentin (NEURONTIN) 100 MG capsule   gabapentin (NEURONTIN) 600 MG tablet   amitriptyline (ELAVIL) 25 MG tablet   oxyCODONE (OXY IR/ROXICODONE) 5 MG immediate release tablet   Chronic radicular Cervical pain (Bilateral C6 Dermatomal distribution) (Chronic)   Relevant Medications   hydrOXYzine (ATARAX/VISTARIL) 50 MG tablet   cyclobenzaprine (FLEXERIL) 10 MG tablet   gabapentin (NEURONTIN) 100 MG capsule   gabapentin (NEURONTIN) 600 MG tablet   amitriptyline (ELAVIL) 25 MG tablet   Other Relevant Orders   Ambulatory referral to Neurosurgery   Herniated cervical disc (C4-5 and C5-6) (Chronic)   Relevant Orders   Ambulatory referral to Neurosurgery   Lumbar disc herniation with radiculopathy (large L4-5 disc herniated nucleus pulposus with central and right paracentral extrusion) (Chronic)   Relevant Medications   hydrOXYzine (ATARAX/VISTARIL) 50 MG tablet   cyclobenzaprine (FLEXERIL) 10 MG tablet   gabapentin  (NEURONTIN) 100 MG capsule   gabapentin (NEURONTIN) 600 MG tablet   amitriptyline (ELAVIL) 25 MG tablet   Other Relevant Orders   Ambulatory referral to Neurosurgery   Lumbar facet syndrome (Bilateral) (R>L) (Chronic)   Relevant Medications   cyclobenzaprine (FLEXERIL) 10 MG tablet   oxyCODONE (OXY IR/ROXICODONE) 5 MG immediate release tablet   Musculoskeletal pain (Chronic)   Relevant Medications   cyclobenzaprine (FLEXERIL) 10 MG tablet   Neurogenic pain (Chronic)   Relevant Medications   gabapentin (NEURONTIN) 100 MG capsule   gabapentin (NEURONTIN) 600 MG tablet    Other Visit Diagnoses   None.      Pharmacotherapy (Medications Ordered): Meds ordered this encounter  Medications  . cyclobenzaprine (FLEXERIL) 10 MG tablet    Sig: Take 1 tablet (10 mg total) by mouth daily  as needed for muscle spasms.    Dispense:  30 tablet    Refill:  2    Do not place this medication, or any other prescription from our practice, on "Automatic Refill". Patient may have prescription filled one day early if pharmacy is closed on scheduled refill date.  . gabapentin (NEURONTIN) 100 MG capsule    Sig: Take 1 capsule (100 mg total) by mouth 5 (five) times daily.    Dispense:  150 capsule    Refill:  2    Do not place this medication, or any other prescription from our practice, on "Automatic Refill". Patient may have prescription filled one day early if pharmacy is closed on scheduled refill date.  . gabapentin (NEURONTIN) 600 MG tablet    Sig: Take 1 tablet (600 mg total) by mouth 5 (five) times daily.    Dispense:  150 tablet    Refill:  2    Do not place this medication, or any other prescription from our practice, on "Automatic Refill". Patient may have prescription filled one day early if pharmacy is closed on scheduled refill date.  Marland Kitchen oxyCODONE (OXY IR/ROXICODONE) 5 MG immediate release tablet    Sig: Sig: Start by taking 1 tab 6x/day and decrease by 1 pill a day, every 4 days.     Dispense:  120 tablet    Refill:  0    Do not place this medication, or any other prescription from our practice, on "Automatic Refill". Patient may have prescription filled one day early if pharmacy is closed on scheduled refill date. Do not fill until:  To last until:    [facility] & Procedure Ordered: Orders Placed This Encounter  Procedures  . Ambulatory referral to Neurosurgery    Imaging Ordered: AMB REFERRAL TO NEUROSURGERY  Interventional Therapies: Scheduled:  None at this time.    Considering:   Diagnostic bilateral lumbar facet block under fluoroscopic guidance and IV sedation for her low back pain.  Possible bilateral lumbar facet radiofrequency ablation depending on the results of the diagnostic injection.  Diagnostic right-sided L4-5 interlaminar epidural steroid injection under fluoroscopic guidance and IV sedation for her right lower extremity pain.  Diagnostic right-sided L4-5 transforaminal epidural steroid injection under fluoroscopic guidance and IV sedation for her right lower extremity pain.  Diagnostic bilateral cervical facet block under fluoroscopic guidance and IV sedation for her neck pain.  Possible bilateral cervical facet radiofrequency ablation depending on the results of the diagnostic injection.  Diagnostic left sided cervical epidural steroid injection under fluoroscopic guidance and IV sedation for her upper extremity pain.  Referral to Dr. Melven Sartorius (neurosurgeon) for evaluation of the patient's cervical and lumbar disc herniations.    PRN Procedures:   Diagnostic bilateral lumbar facet block under fluoroscopic guidance and IV sedation for her low back pain.  Diagnostic right-sided L4-5 interlaminar epidural steroid injection under fluoroscopic guidance and IV sedation for her right lower extremity pain.  Diagnostic right-sided L4-5 transforaminal epidural steroid injection under fluoroscopic guidance and IV sedation for her right lower extremity  pain.  Diagnostic bilateral cervical facet block under fluoroscopic guidance and IV sedation for her neck pain.  Diagnostic left sided cervical epidural steroid injection under fluoroscopic guidance and IV sedation for her upper extremity pain.    Referral(s) or Consult(s): None at this time.  New Prescriptions   OXYCODONE (OXY IR/ROXICODONE) 5 MG IMMEDIATE RELEASE TABLET    Sig: Start by taking 1 tab 6x/day and decrease by 1 pill a day, every  4 days.    Medications administered during this visit: Ms. Kercheval had no medications administered during this visit.  Requested PM Follow-up: Return if symptoms worsen or fail to improve, for (PRN) Procedure.  No future appointments.  Primary Care Physician: Brenda Sauer, MD Location: [facility] Note by: Sydnee Levans. Laban Emperor, M.D, DABA, DABAPM, DABPM, DABIPP, FIPP  Pain Score Disclaimer: We use the NRS-11 scale. This is a self-reported, subjective measurement of pain severity with only modest accuracy. It is used primarily to identify changes within a particular patient. It must be understood that outpatient pain scales are significantly less accurate that those used for research, where they can be applied under ideal controlled circumstances with minimal exposure to variables. In reality, the score is likely to be a combination of pain intensity and pain affect, where pain affect describes the degree of emotional arousal or changes in action readiness caused by the sensory experience of pain. Factors such as social and work situation, setting, emotional state, anxiety levels, expectation, and prior pain experience may influence pain perception and show large inter-individual differences that may also be affected by time variables.  Patient instructions provided during this appointment: There are no Patient Instructions on file for this visit.

## 2016-01-26 NOTE — Telephone Encounter (Signed)
Pt is suppose to have her teeth taken out tomorrow Dr.s office called and they want a letter proving that Dr. Laban EmperorNaveira is not going to write her scripts anymore. Is this true? Let me know and I will fax them a letter.

## 2016-01-26 NOTE — Progress Notes (Signed)
Patient here for medication management.  Patient is scheduled tomorrow for oral surgery to remove all teeth and prepare for dentures.  [facility] [place]    Patient states that she has been having more pain on the left side of neck than she was previously experiencing. This pain has also caused persistant headaches that can be reduced by taking medication.   Oxycodone HCL 10 mg 0/180 last fill 12/22/15  Safety precautions to be maintained throughout the outpatient stay will include: orient to surroundings, keep bed in low position, maintain call bell within reach at all times, provide assistance with transfer out of bed and ambulation.

## 2016-01-26 NOTE — Telephone Encounter (Signed)
Spoke with Dr. Arville CareParks re: postop pain medications after receiving permission from patient to share her medical information( K Barrett witnessed) . Understands that patient received a taper script of oxycodone 5mg  today in our office.

## 2016-01-27 ENCOUNTER — Encounter: Payer: Self-pay | Admitting: *Deleted

## 2016-01-27 NOTE — Telephone Encounter (Signed)
Brenda Berry notified that letter will be faxed to her soon.

## 2016-01-27 NOTE — Telephone Encounter (Signed)
[facility]   [facility] under [facility] for pain meds from Dr. Laban EmperorNaveira, they are doing oral surgery today.   Please call Autumn when you fax this  Phone [phone]  Fax [phone]

## 2016-02-11 ENCOUNTER — Other Ambulatory Visit: Payer: Self-pay | Admitting: Pain Medicine

## 2016-02-11 DIAGNOSIS — M792 Neuralgia and neuritis, unspecified: Secondary | ICD-10-CM

## 2018-06-16 DIAGNOSIS — R1310 Dysphagia, unspecified: Secondary | ICD-10-CM

## 2018-08-12 ENCOUNTER — Ambulatory Visit (INDEPENDENT_AMBULATORY_CARE_PROVIDER_SITE_OTHER): Payer: Medicare Other | Admitting: Family Medicine

## 2018-08-12 ENCOUNTER — Encounter: Payer: Self-pay | Admitting: Family Medicine

## 2018-08-12 ENCOUNTER — Ambulatory Visit
Admission: RE | Admit: 2018-08-12 | Discharge: 2018-08-12 | Disposition: A | Discharge status: Home / Self Care | Payer: Medicare Other | Attending: Family Medicine | Admitting: Family Medicine

## 2018-08-12 ENCOUNTER — Ambulatory Visit
Admission: RE | Admit: 2018-08-12 | Discharge: 2018-08-12 | Disposition: A | Discharge status: Home / Self Care | Payer: Medicare Other | Source: Ambulatory Visit | Attending: Family Medicine | Admitting: Family Medicine

## 2018-08-12 VITALS — BP 120/70 | HR 104 | Ht 62.0 in | Wt 157.0 lb

## 2018-08-12 DIAGNOSIS — J019 Acute sinusitis, unspecified: Secondary | ICD-10-CM

## 2018-08-12 DIAGNOSIS — R059 Cough, unspecified: Secondary | ICD-10-CM

## 2018-08-12 DIAGNOSIS — F17210 Nicotine dependence, cigarettes, uncomplicated: Secondary | ICD-10-CM

## 2018-08-12 DIAGNOSIS — J4521 Mild intermittent asthma with (acute) exacerbation: Secondary | ICD-10-CM

## 2018-08-12 DIAGNOSIS — R05 Cough: Secondary | ICD-10-CM

## 2018-08-12 MED ORDER — ALBUTEROL SULFATE HFA 108 (90 BASE) MCG/ACT IN AERS: 2.0000 | INHALATION_SPRAY | Freq: Four times a day (QID) | RESPIRATORY_TRACT | 2 refills | Status: DC | PRN

## 2018-08-12 MED ORDER — IPRATROPIUM-ALBUTEROL 0.5-2.5 (3) MG/3ML IN SOLN
3.0000 mL | Freq: Once | RESPIRATORY_TRACT | Status: AC
  Administered 2018-08-12: 3 mL via RESPIRATORY_TRACT

## 2018-08-12 MED ORDER — GUAIFENESIN-CODEINE 100-10 MG/5ML PO SYRP: 5.0000 mL | ORAL_SOLUTION | Freq: Four times a day (QID) | ORAL | 0 refills | Status: DC | PRN

## 2018-08-12 MED ORDER — AMOXICILLIN-POT CLAVULANATE 875-125 MG PO TABS: 1.0000 | ORAL_TABLET | Freq: Two times a day (BID) | ORAL | 0 refills | Status: DC

## 2018-08-12 MED ORDER — PREDNISONE 10 MG PO TABS: 10.0000 mg | ORAL_TABLET | Freq: Every day | ORAL | 0 refills | Status: DC

## 2018-08-12 NOTE — Progress Notes (Signed)
Date:  08/12/2018   Name:  Brenda Berry   DOB:  [birth year]   MRN:  [id number]   Chief Complaint: Sinusitis (cough and cong, hoarse, SOB- some production)  Sinusitis  This is a new problem. The current episode started in the past 7 days. The problem has been gradually worsening since onset. There has been no fever. Associated symptoms include congestion, coughing, shortness of breath and sinus pressure. Pertinent negatives include no chills, diaphoresis, ear pain, headaches, hoarse voice, neck pain, sneezing, sore throat or swollen glands. Past treatments include nothing.  Cough  This is a new problem. The current episode started in the past 7 days. The problem has been gradually worsening. The cough is productive of purulent sputum. Associated symptoms include nasal congestion, postnasal drip, shortness of breath and wheezing. Pertinent negatives include no chest pain, chills, ear pain, eye redness, fever, headaches, heartburn, hemoptysis, myalgias, rash, rhinorrhea, sore throat or sweats. Risk factors for lung disease include smoking/tobacco exposure. She has tried nothing for the symptoms. There is no history of environmental allergies.    Review of Systems  Constitutional: Negative.  Negative for chills, diaphoresis, fatigue, fever and unexpected weight change.  HENT: Positive for congestion, postnasal drip and sinus pressure. Negative for ear discharge, ear pain, hoarse voice, rhinorrhea, sneezing and sore throat.   Eyes: Negative for photophobia, pain, discharge, redness and itching.  Respiratory: Positive for cough, shortness of breath and wheezing. Negative for hemoptysis and stridor.   Cardiovascular: Negative for chest pain.  Gastrointestinal: Negative for abdominal pain, blood in stool, constipation, diarrhea, heartburn, nausea and vomiting.  Endocrine: Negative for cold intolerance, heat intolerance, polydipsia, polyphagia and polyuria.  Genitourinary: Negative for dysuria, flank  pain, frequency, hematuria, menstrual problem, pelvic pain, urgency, vaginal bleeding and vaginal discharge.  Musculoskeletal: Negative for arthralgias, back pain, myalgias and neck pain.  Skin: Negative for rash.  Allergic/Immunologic: Negative for environmental allergies and food allergies.  Neurological: Negative for dizziness, weakness, light-headedness, numbness and headaches.  Hematological: Negative for adenopathy. Does not bruise/bleed easily.  Psychiatric/Behavioral: Negative for dysphoric mood. The patient is not nervous/anxious.     Patient Active Problem List   Diagnosis Date Noted  . Lumbar facet syndrome (Bilateral) (R>L) 07/25/2015  . Chronic neck pain (Location of Secondary source of pain) (Bilateral) (L>R) 07/25/2015  . Allergy to contrast media (used for diagnostic x-rays) 04/26/2015  . 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 Epidural fibrosis 04/26/2015  . Musculoskeletal pain 04/26/2015  . Neurogenic pain 04/26/2015  . Neuropathic pain 04/26/2015  . Bulging lumbar disc (L3-4, L4-5, and L5-S1) 04/26/2015  . Lumbar disc herniation with radiculopathy (large L4-5 disc herniated nucleus pulposus with central and right paracentral extrusion) 04/26/2015  . Chronic pelvic pain in female 04/26/2015  . History of endometriosis 04/26/2015  . Sacrococcygeal pain (tailbone pain/sacrococcygeal neuralgia) 04/26/2015  . Coccygodynia 04/26/2015  . Herniated cervical disc (C4-5 and C5-6) 04/26/2015  . Tobacco abuse 04/26/2015  . Chronic obstructive pulmonary disease (COPD) (HCC) 04/26/2015  . Urinary incontinence 04/26/2015  . History of bronchitis 04/26/2015  . Hiatal hernia 04/26/2015  . Pain due to interstitial cystitis 04/26/2015  . History of recurrent UTI (urinary tract infection) 04/26/2015  . History of nephrolithiasis 04/26/2015  . History of hematuria 04/26/2015  . Osteoarthrosis 04/26/2015  . Status post hysterectomy 04/26/2015  . Chronic pain  04/25/2015  . Long term current use of opiate analgesic 04/25/2015  . Long term prescription opiate use 04/25/2015  .  Opiate use (90 MME/Day) 04/25/2015  . Opiate dependence (HCC) 04/25/2015  . Encounter for therapeutic drug level monitoring 04/25/2015  . Chronic low back pain (Location of Primary Source of Pain) (Bilateral) (L>R) 04/25/2015  . Lumbar spondylosis 04/25/2015  . Chronic radicular Cervical pain (Bilateral C6 Dermatomal distribution) 04/25/2015  . Cervical spondylosis 04/25/2015  . Chronic radicular Lumbar pain (Location of Tertiary source of pain) (Right S1 Dermatomal pain) 04/25/2015  . Current tobacco use 01/28/2015    Allergies  Allergen Reactions  . Ciprofloxacin   . Demerol [Meperidine]   . Ibuprofen   . Iodinated Diagnostic Agents     Reports had SOB with last infusion of dye  . Latex   . Nifedipine   . Sulfa Antibiotics   . Terbutaline     Past Surgical History:  Procedure Laterality Date  . BACK SURGERY    . BLADDER SURGERY    . VAGINAL HYSTERECTOMY      Social History   Tobacco Use  . Smoking status: Current Every Day Smoker    Packs/day: 1.00  . Smokeless tobacco: Never Used  Substance Use Topics  . Alcohol use: No    Alcohol/week: 0.0 standard drinks  . Drug use: No     Medication list has been reviewed and updated.  Current Meds  Medication Sig  . acetaminophen (TYLENOL) 325 MG tablet Take 650 mg by mouth every 4 (four) hours as needed.   Marland Kitchen amitriptyline (ELAVIL) 25 MG tablet Take 25 mg by mouth at bedtime.  . gabapentin (NEURONTIN) 300 MG capsule Take 1 capsule by mouth 3 (three) times daily.  Marland Kitchen oxybutynin (DITROPAN) 5 MG tablet Take 1 tablet by mouth as needed.  Marland Kitchen tiZANidine (ZANAFLEX) 4 MG tablet Take 1 tablet by mouth 3 (three) times daily.    PHQ 2/9 Scores 01/26/2016 10/24/2015 07/25/2015 04/25/2015  PHQ - 2 Score 0 0 0 0    Physical Exam Vitals signs and nursing note reviewed.  Constitutional:      General: She is not in  acute distress.    Appearance: She is not ill-appearing, toxic-appearing or diaphoretic.  HENT:     Head: Normocephalic and atraumatic.     Right Ear: External ear normal.     Left Ear: External ear normal.     Nose: Nose normal.  Eyes:     General:        Right eye: No discharge.        Left eye: No discharge.     Conjunctiva/sclera: Conjunctivae normal.     Pupils: Pupils are equal, round, and reactive to light.  Neck:     Musculoskeletal: Normal range of motion and neck supple. No neck rigidity.     Thyroid: No thyromegaly.     Vascular: No JVD.  Cardiovascular:     Rate and Rhythm: Regular rhythm. Tachycardia present.     Pulses: Normal pulses.     Heart sounds: Normal heart sounds. No murmur. No friction rub. No gallop.   Pulmonary:     Effort: Pulmonary effort is normal.     Breath sounds: Decreased air movement present. No stridor. Wheezing present. No decreased breath sounds, rhonchi or rales.  Abdominal:     General: Bowel sounds are normal.     Palpations: Abdomen is soft. There is no mass.     Tenderness: There is no abdominal tenderness. There is no guarding.  Musculoskeletal: Normal range of motion.  Lymphadenopathy:     Cervical: No cervical adenopathy.  Skin:    General: Skin is warm and dry.  Neurological:     Mental Status: She is alert.     Deep Tendon Reflexes: Reflexes are normal and symmetric.     BP 120/70   Pulse (!) 104   Ht 5\' 2"  (1.575 m)   Wt 157 lb (71.2 kg)   SpO2 98%   BMI 28.72 kg/m   Assessment and Plan: 1. Acute sinusitis, recurrence not specified, unspecified location Acute. Patient c/o SOB and congestion. Despite signs and symptoms, patient continues to smoke. Has been advised to quit. Hasn't had a chest xray since 2013. Will repeat today. Start Augmentin - amoxicillin-clavulanate (AUGMENTIN) 875-125 MG tablet; Take 1 tablet by mouth 2 (two) times daily.  Dispense: 20 tablet; Refill: 0  2. Mild intermittent reactive airway disease  with acute exacerbation Acute. C/O SOB and congestion. Will start prednisone and obtain chest xray. Refill albuterol inhaler and administered Duoneb nebulizer treatment. After receiving treatment, patient seems to be passing air better. - DG Chest 2 View; Future - ipratropium-albuterol (DUONEB) 0.5-2.5 (3) MG/3ML nebulizer solution 3 mL - predniSONE (DELTASONE) 10 MG tablet; Take 1 tablet (10 mg total) by mouth daily with breakfast.  Dispense: 30 tablet; Refill: 0 - albuterol (PROVENTIL HFA;VENTOLIN HFA) 108 (90 Base) MCG/ACT inhaler; Inhale 2 puffs into the lungs every 6 (six) hours as needed for wheezing or shortness of breath.  Dispense: 1 Inhaler; Refill: 2  3. Cough Acute. Prescribe Robitussin AC and obtain chest xray. - guaiFENesin-codeine (ROBITUSSIN AC) 100-10 MG/5ML syrup; Take 5 mLs by mouth 4 (four) times daily as needed for cough.  Dispense: 118 mL; Refill: 0  4. Cigarette nicotine dependence without complication Chronic. Patient has been educated and advised to stop smoking. She has no interest in stopping at this time. Will repeat chest xray and compare to 2013.

## 2018-08-12 NOTE — Patient Instructions (Signed)

## 2019-01-08 ENCOUNTER — Other Ambulatory Visit: Payer: Self-pay

## 2019-01-08 ENCOUNTER — Ambulatory Visit (INDEPENDENT_AMBULATORY_CARE_PROVIDER_SITE_OTHER): Payer: Medicare Other | Admitting: Family Medicine

## 2019-01-08 ENCOUNTER — Encounter: Payer: Self-pay | Admitting: Family Medicine

## 2019-01-08 VITALS — BP 124/70 | HR 80 | Ht 62.0 in | Wt 159.0 lb

## 2019-01-08 DIAGNOSIS — M170 Bilateral primary osteoarthritis of knee: Secondary | ICD-10-CM

## 2019-01-08 DIAGNOSIS — M222X1 Patellofemoral disorders, right knee: Secondary | ICD-10-CM | POA: Diagnosis not present

## 2019-01-08 DIAGNOSIS — K130 Diseases of lips: Secondary | ICD-10-CM

## 2019-01-08 DIAGNOSIS — M222X2 Patellofemoral disorders, left knee: Secondary | ICD-10-CM

## 2019-01-08 DIAGNOSIS — R1310 Dysphagia, unspecified: Secondary | ICD-10-CM

## 2019-01-08 DIAGNOSIS — R1319 Other dysphagia: Secondary | ICD-10-CM

## 2019-01-08 MED ORDER — PANTOPRAZOLE SODIUM 40 MG PO TBEC: 40.0000 mg | DELAYED_RELEASE_TABLET | Freq: Every day | ORAL | 3 refills | Status: DC

## 2019-01-08 MED ORDER — CLOTRIMAZOLE-BETAMETHASONE 1-0.05 % EX CREA: 1.0000 "application " | TOPICAL_CREAM | Freq: Two times a day (BID) | CUTANEOUS | 0 refills | Status: DC

## 2019-01-08 NOTE — Patient Instructions (Addendum)
Patellofemoral Pain Syndrome  Patellofemoral pain syndrome is a condition in which the tissue (cartilage) on the underside of the kneecap (patella) softens or breaks down. This causes pain in the front of the knee. The condition is also called runner's knee or chondromalacia patella. Patellofemoral pain syndrome is most common in young adults who are active in sports. The knee is the largest joint in the body. The patella covers the front of the knee and is attached to muscles above and below the knee. The underside of the patella is covered with a smooth type of cartilage (synovium). The smooth surface helps the patella to glide easily when you move your knee. Patellofemoral pain syndrome causes swelling in the joint linings and bone surfaces in the knee. What are the causes? This condition may be caused by:  Overuse of the knee.  Poor alignment of your knee joints.  Weak leg muscles.  A direct blow to your kneecap. What increases the risk? You are more likely to develop this condition if:  You do a lot of activities that can wear down your kneecap. These include: ? Running. ? Squatting. ? Climbing stairs.  You start a new physical activity or exercise program.  You wear shoes that do not fit well.  You do not have good leg strength.  You are overweight. What are the signs or symptoms? The main symptom of this condition is knee pain. This may feel like a dull, aching pain underneath your patella, in the front of your knee. There may be a popping or cracking sound when you move your knee. Pain may get worse with:  Exercise.  Climbing stairs.  Running.  Jumping.  Squatting.  Kneeling.  Sitting for a long time.  Moving or pushing on your patella. How is this diagnosed? This condition may be diagnosed based on:  Your symptoms and medical history. You may be asked about your recent physical activities and which ones cause knee pain.  A physical exam. This may include:  ? Moving your patella back and forth. ? Checking your range of knee motion. ? Having you squat or jump to see if you have pain. ? Checking the strength of your leg muscles.  Imaging tests to confirm the diagnosis. These may include an MRI of your knee. How is this treated? This condition may be treated at home with rest, ice, compression, and elevation (RICE).  Other treatments may include:  Nonsteroidal anti-inflammatory drugs (NSAIDs).  Physical therapy to stretch and strengthen your leg muscles.  Shoe inserts (orthotics) to take stress off your knee.  A knee brace or knee support.  Adhesive tapes to the skin.  Surgery to remove damaged cartilage or move the patella to a better position. This is rare. Follow these instructions at home: If you have a shoe or brace:  Wear the shoe or brace as told by your health care provider. Remove it only as told by your health care provider.  Loosen the shoe or brace if your toes tingle, become numb, or turn cold and blue.  Keep the shoe or brace clean.  If the shoe or brace is not waterproof: ? Do not let it get wet. ? Cover it with a watertight covering when you take a bath or a shower. Managing pain, stiffness, and swelling  If directed, put ice on the painful area. ? If you have a removable shoe or brace, remove it as told by your health care provider. ? Put ice in a plastic bag. ?   Place a towel between your skin and the bag. ? Leave the ice on for 20 minutes, 2-3 times a day.  Move your toes often to avoid stiffness and to lessen swelling.  Rest your knee: ? Avoid activities that cause knee pain. ? When sitting or lying down, raise (elevate) the injured area above the level of your heart, whenever possible. General instructions  Take over-the-counter and prescription medicines only as told by your health care provider.  Use splints, braces, knee supports, or walking aids as directed by your health care provider.  Perform  stretching and strengthening exercises as told by your health care provider or physical therapist.  Do not use any products that contain nicotine or tobacco, such as cigarettes and e-cigarettes. These can delay healing. If you need help quitting, ask your health care provider.  Return to your normal activities as told by your health care provider. Ask your health care provider what activities are safe for you.  Keep all follow-up visits as told by your health care provider. This is important. Contact a health care provider if:  Your symptoms get worse.  You are not improving with home care. Summary  Patellofemoral pain syndrome is a condition in which the tissue (cartilage) on the underside of the kneecap (patella) softens or breaks down.  This condition causes swelling in the joint linings and bone surfaces in the knee. This leads to pain in the front of the knee.  This condition may be treated at home with rest, ice, compression, and elevation (RICE).  Use splints, braces, knee supports, or walking aids as directed by your health care provider. This information is not intended to replace advice given to you by your health care provider. Make sure you discuss any questions you have with your health care provider. Document Released: 05/16/2009 Document Revised: 07/08/2017 Document Reviewed: 07/08/2017 Elsevier Patient Education  [place]. Chondroitin; Glucosamine tablets or capsules What is this medicine? CHONDROITIN; GLUCOSAMINE (chon DROI tin; gloo KOH suh meen) is a dietary supplement. It is promoted for its ability to reduce the symptoms of osteoarthritis by maintaining healthy joint cartilage. The FDA has not approved this supplement for any medical use. This medicine may be used for other purposes; ask your health care provider or pharmacist if you have questions. COMMON BRAND NAME(S): OptiFlex Complete, OptiFlex Sport What should I tell my health care provider before I  take this medicine? They need to know if you have any of these conditions:  diabetes  heart disease  kidney disease  liver disease  stomach or intestinal problems  an unusual or allergic reaction to chondroitin, glucosamine, sulfonamides, other medicines, foods, dyes, or preservatives  pregnant or trying to get pregnant  breast-feeding How should I use this medicine? Take this supplement by mouth with a glass of water. Follow the directions on the package labeling, or take as directed by your health care professional. Take your medicine at regular intervals. Do not take this supplement more often than directed. Talk to your pediatrician regarding the use of this medicine in children. Special care may be needed. Overdosage: If you think you have taken too much of this medicine contact a poison control center or emergency room at once. NOTE: This medicine is only for you. Do not share this medicine with others. What if I miss a dose? If you miss a dose, take it as soon as you can. If it is almost time for your next dose, take only that dose. Do not  take double or extra doses. What may interact with this medicine? Check with your doctor or healthcare professional if you are taking any of the following medications:  warfarin This list may not describe all possible interactions. Give your health care provider a list of all the medicines, herbs, non-prescription drugs, or dietary supplements you use. Also tell them if you smoke, drink alcohol, or use illegal drugs. Some items may interact with your medicine. What should I watch for while using this medicine? Tell your doctor or healthcare professional if your symptoms do not start to get better or if they get worse. This supplement may take several weeks to work for you. If you are scheduled for any medical or dental procedure, tell your healthcare provider that you are taking this supplement. You may need to stop taking this supplement  before the procedure. This supplement may affect blood sugar levels. If you have diabetes, check with your doctor or health care professional before you change your diet or the dose of your diabetic medicine. Herbal or dietary supplements are not regulated like medicines. Rigid quality control standards are not required for dietary supplements. The purity and strength of these products can vary. The safety and effect of this dietary supplement for a certain disease or illness is not well known. This product is not intended to diagnose, treat, cure or prevent any disease. The Food and Drug Administration suggests the following to help consumers protect themselves:  Always read product labels and follow directions.  Natural does not mean a product is safe for humans to take.  Look for products that include USP after the ingredient name. This means that the manufacturer followed the standards of the [place] Pharmacopoeia.  Supplements made or sold by a nationally known food or drug company are more likely to be made under tight controls. You can write to the company for more information about how the product was made. What side effects may I notice from receiving this medicine? Side effects that you should report to your doctor or health care professional as soon as possible:  allergic reactions like skin rash, itching or hives, swelling of the face, lips, or tongue  breathing problems  constipation  diarrhea  difficulty sleeping  drowsiness  hair loss  headache  loss of appetite  stomach pain  swelling of the ankles or feet Side effects that usually do not require medical attention (report to your doctor or health care professional if they continue or are bothersome):  gas  nausea  upset stomach This list may not describe all possible side effects. Call your doctor for medical advice about side effects. You may report side effects to FDA at 1-800-FDA-1088. Where should I keep my  medicine? Keep out of the reach of children. Store at room temperature between 15 and 30 degrees C (59 and 86 degrees F) or as directed on the package label. Protect from moisture. Throw away any unused supplement after the expiration date. NOTE: This sheet is a summary. It may not cover all possible information. If you have questions about this medicine, talk to your doctor, pharmacist, or health care provider.  2020 Elsevier/Gold Standard (2014-06-21 06:58:19)

## 2019-01-08 NOTE — Progress Notes (Signed)
Date:  01/08/2019   Name:  Brenda Berry   DOB:  22-Mar-1971   MRN:  [id number]   Chief Complaint: Dysphagia (has a hiatal hernia, but has been having trouble swallowing for 6 months. Liquids or food- not necessarily every time she eats or swallows. ), Knee Pain (they are hurting especially when bending them. it is a burning/ aching sensation), and lips cracking (in corners)  Knee Pain  The incident occurred more than 1 week ago. There was no injury mechanism. The pain is present in the right knee and left knee (left> right). The quality of the pain is described as aching. The pain is moderate. The pain has been fluctuating since onset. Pertinent negatives include no inability to bear weight, loss of motion or numbness. The symptoms are aggravated by movement, palpation and weight bearing. The treatment provided moderate relief.  GI Problem Primary symptoms do not include fever, weight loss, fatigue, abdominal pain, nausea, vomiting, diarrhea, melena, hematemesis, jaundice, hematochezia, dysuria, myalgias, arthralgias or rash.  The illness does not include chills, constipation or back pain.    Review of Systems  Constitutional: Negative.  Negative for chills, fatigue, fever, unexpected weight change and weight loss.  HENT: Negative for congestion, ear discharge, ear pain, rhinorrhea, sinus pressure, sneezing and sore throat.   Eyes: Negative for photophobia, pain, discharge, redness and itching.  Respiratory: Negative for cough, shortness of breath, wheezing and stridor.   Gastrointestinal: Negative for abdominal pain, blood in stool, constipation, diarrhea, hematemesis, hematochezia, jaundice, melena, nausea and vomiting.  Endocrine: Negative for cold intolerance, heat intolerance, polydipsia, polyphagia and polyuria.  Genitourinary: Negative for dysuria, flank pain, frequency, hematuria, menstrual problem, pelvic pain, urgency, vaginal bleeding and vaginal discharge.  Musculoskeletal:  Negative for arthralgias, back pain and myalgias.  Skin: Negative for rash.  Allergic/Immunologic: Negative for environmental allergies and food allergies.  Neurological: Negative for dizziness, weakness, light-headedness, numbness and headaches.  Hematological: Negative for adenopathy. Does not bruise/bleed easily.  Psychiatric/Behavioral: Negative for dysphoric mood. The patient is not nervous/anxious.     Patient Active Problem List   Diagnosis Date Noted  . Lumbar facet syndrome (Bilateral) (R>L) 07/25/2015  . Chronic neck pain (Location of Secondary source of pain) (Bilateral) (L>R) 07/25/2015  . Allergy to contrast media (used for diagnostic x-rays) 04/26/2015  . Failed back surgical syndrome (2002 by Dr. March Rummage) 04/26/2015  . Epidural fibrosis 04/26/2015  . Musculoskeletal pain 04/26/2015  . Neurogenic pain 04/26/2015  . Neuropathic pain 04/26/2015  . Bulging lumbar disc (L3-4, L4-5, and L5-S1) 04/26/2015  . Lumbar disc herniation with radiculopathy (large L4-5 disc herniated nucleus pulposus with central and right paracentral extrusion) 04/26/2015  . Chronic pelvic pain in female 04/26/2015  . History of endometriosis 04/26/2015  . Sacrococcygeal pain (tailbone pain/sacrococcygeal neuralgia) 04/26/2015  . Coccygodynia 04/26/2015  . Herniated cervical disc (C4-5 and C5-6) 04/26/2015  . Tobacco abuse 04/26/2015  . Chronic obstructive pulmonary disease (COPD) ([place]) 04/26/2015  . Urinary incontinence 04/26/2015  . History of bronchitis 04/26/2015  . Hiatal hernia 04/26/2015  . Pain due to interstitial cystitis 04/26/2015  . History of recurrent UTI (urinary tract infection) 04/26/2015  . History of nephrolithiasis 04/26/2015  . History of hematuria 04/26/2015  . Osteoarthrosis 04/26/2015  . Status post hysterectomy 04/26/2015  . Chronic pain 04/25/2015  . Long term current use of opiate analgesic 04/25/2015  . Long term prescription opiate use 04/25/2015  . Opiate use (90  MME/Day) 04/25/2015  . Opiate dependence ([place]) 04/25/2015  .  Encounter for therapeutic drug level monitoring 04/25/2015  . Chronic low back pain (Location of Primary Source of Pain) (Bilateral) (L>R) 04/25/2015  . Lumbar spondylosis 04/25/2015  . Chronic radicular Cervical pain (Bilateral C6 Dermatomal distribution) 04/25/2015  . Cervical spondylosis 04/25/2015  . Chronic radicular Lumbar pain (Location of Tertiary source of pain) (Right S1 Dermatomal pain) 04/25/2015  . Current tobacco use 01/28/2015    Allergies  Allergen Reactions  . Ciprofloxacin   . Demerol [Meperidine]   . Ibuprofen   . Iodinated Diagnostic Agents     Reports had SOB with last infusion of dye  . Latex   . Nifedipine   . Sulfa Antibiotics   . Terbutaline     Past Surgical History:  Procedure Laterality Date  . BACK SURGERY    . BLADDER SURGERY    . VAGINAL HYSTERECTOMY      Social History   Tobacco Use  . Smoking status: Current Every Day Smoker    Packs/day: 1.00  . Smokeless tobacco: Never Used  Substance Use Topics  . Alcohol use: No    Alcohol/week: 0.0 standard drinks  . Drug use: No     Medication list has been reviewed and updated.  Current Meds  Medication Sig  . acetaminophen (TYLENOL) 325 MG tablet Take 650 mg by mouth every 4 (four) hours as needed.   Marland Kitchen. albuterol (PROVENTIL HFA;VENTOLIN HFA) 108 (90 Base) MCG/ACT inhaler Inhale 2 puffs into the lungs every 6 (six) hours as needed for wheezing or shortness of breath.  . gabapentin (NEURONTIN) 300 MG capsule Take 1 capsule by mouth 3 (three) times daily.  Marland Kitchen. oxybutynin (DITROPAN) 5 MG tablet Take 1 tablet by mouth as needed.  Marland Kitchen. tiZANidine (ZANAFLEX) 4 MG tablet Take 1 tablet by mouth 3 (three) times daily.    PHQ 2/9 Scores 01/08/2019 01/26/2016 10/24/2015 07/25/2015  PHQ - 2 Score 0 0 0 0  PHQ- 9 Score 0 - - -    BP Readings from Last 3 Encounters:  01/08/19 124/70  08/12/18 120/70  01/26/16 (!) 148/61    Physical Exam  Vitals signs and nursing note reviewed.  Constitutional:      General: She is not in acute distress.    Appearance: She is not diaphoretic.  HENT:     Head: Normocephalic and atraumatic.     Right Ear: Tympanic membrane, ear canal and external ear normal.     Left Ear: Tympanic membrane, ear canal and external ear normal.     Nose: Nose normal.  Eyes:     General:        Right eye: No discharge.        Left eye: No discharge.     Conjunctiva/sclera: Conjunctivae normal.     Pupils: Pupils are equal, round, and reactive to light.  Neck:     Musculoskeletal: Normal range of motion and neck supple.     Thyroid: No thyromegaly.     Vascular: No JVD.  Cardiovascular:     Rate and Rhythm: Normal rate and regular rhythm.     Chest Wall: PMI is not displaced. No thrill.     Pulses: Normal pulses. No decreased pulses.     Heart sounds: Normal heart sounds, S1 normal and S2 normal. No murmur. No systolic murmur. No diastolic murmur. No friction rub. No gallop.   Pulmonary:     Effort: Pulmonary effort is normal.     Breath sounds: Normal breath sounds. No decreased air movement or transmitted  upper airway sounds. No decreased breath sounds, wheezing, rhonchi or rales.  Chest:     Breasts:        Right: Normal.        Left: Normal.  Abdominal:     General: Bowel sounds are normal.     Palpations: Abdomen is soft. There is no mass.     Tenderness: There is no abdominal tenderness. There is no guarding.  Musculoskeletal: Normal range of motion.     Right knee: She exhibits no effusion and no LCL laxity. Tenderness found. Medial joint line and lateral joint line tenderness noted.     Left knee: Tenderness found. Medial joint line and lateral joint line tenderness noted. No MCL, no LCL and no patellar tendon tenderness noted.     Comments: Patellar crepetence bilateral  Lymphadenopathy:     Cervical: No cervical adenopathy.     Upper Body:     Right upper body: No axillary adenopathy.      Left upper body: No axillary adenopathy.  Skin:    General: Skin is warm and dry.  Neurological:     Mental Status: She is alert.     Deep Tendon Reflexes: Reflexes are normal and symmetric.     Wt Readings from Last 3 Encounters:  01/08/19 159 lb (72.1 kg)  08/12/18 157 lb (71.2 kg)  01/26/16 130 lb (59 kg)    BP 124/70   Pulse 80   Ht 5\' 2"  (1.575 m)   Wt 159 lb (72.1 kg)   BMI 29.08 kg/m   Assessment and Plan: 1. Esophageal dysphagia New onset of dysphasia including food and liquids of the mid the esophageal area.  Patient has had a history of reflux for which she is not been taking anything on a regular basis.  Will refer to GI for evaluation and possible endoscopy. - Ambulatory referral to Gastroenterology - pantoprazole (PROTONIX) 40 MG tablet; Take 1 tablet (40 mg total) by mouth daily.  Dispense: 30 tablet; Refill: 3  2. Primary osteoarthritis of both knees Bilateral knee pain not associated with any trauma.  There is medial joint line tenderness of the left greater than the right.  With minimal swelling/effusion.  Will refer to orthopedics for evaluation.  Patient unable to take NSAIDs and is hesitant for long-term Tylenol.  Will suggest osteo-chondroitin at this time. - Ambulatory referral to Orthopedic Surgery  3. Patellofemoral pain syndrome of both knees Crepitance noted primarily under the knee right patella upon extension.  This is consistent with patellofemoral syndrome and again we will continue above regimen of osteo-chondroitin since patient is allergic to NSAIDs. - Ambulatory referral to Orthopedic Surgery  4. Cheilitis Patient has breaking out in fissuring in the angular areas of the lips.  Will initiate Lotrisone twice a day on a as needed basis. - clotrimazole-betamethasone (LOTRISONE) cream; Apply 1 application topically 2 (two) times daily.  Dispense: 30 g; Refill: 0

## 2019-01-09 ENCOUNTER — Other Ambulatory Visit: Payer: Self-pay

## 2019-01-09 ENCOUNTER — Ambulatory Visit: Payer: Self-pay | Admitting: Family Medicine

## 2019-01-09 ENCOUNTER — Encounter: Payer: Self-pay | Admitting: Gastroenterology

## 2019-01-09 ENCOUNTER — Ambulatory Visit (INDEPENDENT_AMBULATORY_CARE_PROVIDER_SITE_OTHER): Payer: Medicare Other | Admitting: Gastroenterology

## 2019-01-09 VITALS — BP 128/73 | HR 99 | Temp 98.2°F | Ht 62.0 in | Wt 162.0 lb

## 2019-01-09 DIAGNOSIS — R1011 Right upper quadrant pain: Secondary | ICD-10-CM | POA: Diagnosis not present

## 2019-01-09 DIAGNOSIS — R1310 Dysphagia, unspecified: Secondary | ICD-10-CM

## 2019-01-09 DIAGNOSIS — R1013 Epigastric pain: Secondary | ICD-10-CM | POA: Diagnosis not present

## 2019-01-09 NOTE — Patient Instructions (Signed)
You are scheduled for a RUQ abdominal US at [facility] on Wednesday, August 5th at 8:00am. Please arrive at the registration desk at 7:45am. You cannot have anything to eat or drink after midnight on Tuesday night.  If you need to reschedule this appointment for any reason, please contact central scheduling at [phone].

## 2019-01-09 NOTE — Progress Notes (Signed)
Arlyss Repressohini R , MD [address]  [address]  [place], [place] [address]  Main: [phone]  Fax: [phone]    Gastroenterology Consultation  Referring Provider:     Duanne LimerickJones, Deanna C, MD Primary Care Physician:  Duanne LimerickJones, Deanna C, MD Primary Gastroenterologist:  Dr. Arlyss Repressohini R  Reason for Consultation:     Difficulty swallowing, epigastric pain        HPI:   Brenda Berry is a 48 y.o. female referred by Dr. Duanne LimerickJones, Deanna C, MD  for consultation & management of difficulty swallowing, epigastric pain.  Patient reports that she has been experiencing difficulty swallowing intermittently for a long time but for the last few months, it has become regular, almost daily basis and assisted with both solids and liquids.  She has choking sensation and feels like spasm.  Although, she did not have episodes of regurgitation or throwing up.  She did not lose weight.  She does not feel anxious or stressed or depressed.  She also reports epigastric burning pain associated with intermittent bloating.  She is started on Protonix 40 mg by Dr. Yetta BarreJones yesterday.  She also reports intermittent right upper quadrant pain, sporadic with no particular association to food or radiation to the back or shoulder.  She does have chronic severe back issues both in cervical spine and lumbar spine.  She is wondering if her swallowing issues are related to cervical spine.  She also reports altered bowel habits which is chronic since she is young and was told she has irritable bowel syndrome.  She does acknowledge drinking sodas regularly  NSAIDs: None  Antiplts/Anticoagulants/Anti thrombotics: None  GI Procedures: None She denies family history of GI malignancy, IBD She does have family history of IBS  Past Medical History:  Diagnosis Date  . Arthritis   . Degenerative disc disease, cervical   . Degenerative disc disease, lumbar   . Hiatal hernia 04/26/2015  . History of bronchitis 04/26/2015  . History of  hematuria 04/26/2015  . History of nephrolithiasis 04/26/2015  . History of recurrent UTI (urinary tract infection) 04/26/2015  . Interstitial cystitis   . Status post hysterectomy 04/26/2015    Past Surgical History:  Procedure Laterality Date  . BACK SURGERY    . BLADDER SURGERY    . VAGINAL HYSTERECTOMY      Current Outpatient Medications:  .  acetaminophen (TYLENOL) 325 MG tablet, Take 650 mg by mouth every 4 (four) hours as needed. , Disp: , Rfl:  .  albuterol (PROVENTIL HFA;VENTOLIN HFA) 108 (90 Base) MCG/ACT inhaler, Inhale 2 puffs into the lungs every 6 (six) hours as needed for wheezing or shortness of breath., Disp: 1 Inhaler, Rfl: 2 .  amitriptyline (ELAVIL) 25 MG tablet, Take 25 mg by mouth at bedtime., Disp: , Rfl:  .  clotrimazole-betamethasone (LOTRISONE) cream, Apply 1 application topically 2 (two) times daily., Disp: 30 g, Rfl: 0 .  gabapentin (NEURONTIN) 100 MG capsule, TK ONE C PO TID, Disp: , Rfl:  .  gabapentin (NEURONTIN) 300 MG capsule, Take 1 capsule by mouth 3 (three) times daily., Disp: , Rfl:  .  hydrOXYzine (ATARAX/VISTARIL) 25 MG tablet, Take by mouth., Disp: , Rfl:  .  oxybutynin (DITROPAN) 5 MG tablet, Take 1 tablet by mouth as needed., Disp: , Rfl:  .  pantoprazole (PROTONIX) 40 MG tablet, Take 1 tablet (40 mg total) by mouth daily., Disp: 30 tablet, Rfl: 3 .  pentosan polysulfate (ELMIRON) 100 MG capsule, Take by mouth., Disp: ,  Rfl:  .  tiZANidine (ZANAFLEX) 4 MG tablet, Take 1 tablet by mouth 3 (three) times daily., Disp: , Rfl:  .  cyclobenzaprine (FLEXERIL) 10 MG tablet, Take by mouth., Disp: , Rfl:  .  Oxycodone HCl 10 MG TABS, Take by mouth., Disp: , Rfl:     Family History  Problem Relation Age of Onset  . Diabetes Mother   . Hyperlipidemia Mother   . Hypertension Mother   . Heart disease Father      Social History   Tobacco Use  . Smoking status: Current Every Day Smoker    Packs/day: 1.00  . Smokeless tobacco: Never Used   Substance Use Topics  . Alcohol use: No    Alcohol/week: 0.0 standard drinks  . Drug use: No    Allergies as of 01/09/2019 - Review Complete 01/09/2019  Allergen Reaction Noted  . Ciprofloxacin  12/06/2014  . Demerol [meperidine]  12/06/2014  . Ibuprofen  12/06/2014  . Iodinated diagnostic agents  07/25/2015  . Latex  12/06/2014  . Nifedipine  12/06/2014  . Other Hives 02/18/2013  . Sulfa antibiotics  12/06/2014  . Terbutaline  12/06/2014    Review of Systems:    All systems reviewed and negative except where noted in HPI.   Physical Exam:  BP 128/73   Pulse 99   Temp 98.2 F (36.8 C) (Oral)  No LMP recorded. Patient has had a hysterectomy.  General:   Alert,  Well-developed, well-nourished, pleasant and cooperative in NAD Head:  Normocephalic and atraumatic. Eyes:  Sclera clear, no icterus.   Conjunctiva pink. Ears:  Normal auditory acuity. Nose:  No deformity, discharge, or lesions. Mouth:  No deformity or lesions,oropharynx pink & moist. Neck:  Supple; no masses or thyromegaly. Lungs:  Respirations even and unlabored.  Clear throughout to auscultation.   No wheezes, crackles, or rhonchi. No acute distress. Heart:  Regular rate and rhythm; no murmurs, clicks, rubs, or gallops. Abdomen:  Normal bowel sounds. Soft, mild epigastric tenderness and non-distended without masses, hepatosplenomegaly or hernias noted.  No guarding or rebound tenderness.   Rectal: Not performed Msk:  Symmetrical without gross deformities. Good, equal movement & strength bilaterally. Pulses:  Normal pulses noted. Extremities:  No clubbing or edema.  No cyanosis. Neurologic:  Alert and oriented x3;  grossly normal neurologically. Skin:  Intact without significant lesions or rashes. No jaundice. Psych:  Alert and cooperative. Normal mood and affect.  Imaging Studies: No recent abdominal imaging  Assessment and Plan:   Brenda Berry is a 48 y.o. female with tobacco use, chronic back issues,  degenerative disc disease seen in consultation for dysphagia, epigastric pain and right upper quadrant pain  Increase Protonix to 40 mg twice daily before meals Recommend EGD with esophageal, gastric and duodenal biopsies Recommend right upper quadrant ultrasound to evaluate for gallbladder Advised her to completely limit sodas and other carbonated beverages   Follow up in 6 weeks   Cephas Darby, MD

## 2019-01-09 NOTE — Addendum Note (Signed)
Addended byGlennie Isle E on: 01/09/2019 11:47 AM   Modules accepted: Orders, SmartSet

## 2019-01-12 ENCOUNTER — Other Ambulatory Visit: Payer: Self-pay

## 2019-01-12 ENCOUNTER — Other Ambulatory Visit
Admission: RE | Admit: 2019-01-12 | Discharge: 2019-01-12 | Disposition: A | Discharge status: Home / Self Care | Payer: Medicare Other | Source: Ambulatory Visit | Attending: Gastroenterology | Admitting: Gastroenterology

## 2019-01-12 DIAGNOSIS — Z20828 Contact with and (suspected) exposure to other viral communicable diseases: Secondary | ICD-10-CM | POA: Diagnosis not present

## 2019-01-12 DIAGNOSIS — R1310 Dysphagia, unspecified: Secondary | ICD-10-CM | POA: Insufficient documentation

## 2019-01-12 DIAGNOSIS — Z01812 Encounter for preprocedural laboratory examination: Secondary | ICD-10-CM | POA: Diagnosis not present

## 2019-01-12 LAB — SARS CORONAVIRUS 2 (TAT 6-24 HRS): SARS Coronavirus 2: NEGATIVE

## 2019-01-14 ENCOUNTER — Ambulatory Visit
Admission: RE | Admit: 2019-01-14 | Discharge: 2019-01-14 | Disposition: A | Discharge status: Home / Self Care | Payer: Medicare Other | Source: Ambulatory Visit | Attending: Gastroenterology | Admitting: Gastroenterology

## 2019-01-14 ENCOUNTER — Other Ambulatory Visit: Payer: Self-pay

## 2019-01-14 DIAGNOSIS — G8929 Other chronic pain: Secondary | ICD-10-CM | POA: Diagnosis not present

## 2019-01-14 DIAGNOSIS — M2550 Pain in unspecified joint: Secondary | ICD-10-CM | POA: Diagnosis not present

## 2019-01-14 DIAGNOSIS — R1011 Right upper quadrant pain: Secondary | ICD-10-CM | POA: Insufficient documentation

## 2019-01-14 DIAGNOSIS — M25561 Pain in right knee: Secondary | ICD-10-CM | POA: Diagnosis not present

## 2019-01-14 DIAGNOSIS — M25562 Pain in left knee: Secondary | ICD-10-CM | POA: Diagnosis not present

## 2019-01-15 ENCOUNTER — Encounter
Admission: RE | Disposition: A | Discharge status: Home / Self Care | Payer: Self-pay | Source: Home / Self Care | Attending: Gastroenterology

## 2019-01-15 ENCOUNTER — Encounter: Payer: Self-pay | Admitting: *Deleted

## 2019-01-15 ENCOUNTER — Other Ambulatory Visit: Payer: Self-pay

## 2019-01-15 ENCOUNTER — Ambulatory Visit: Payer: Medicare Other | Admitting: Anesthesiology

## 2019-01-15 ENCOUNTER — Ambulatory Visit
Admission: RE | Admit: 2019-01-15 | Discharge: 2019-01-15 | Disposition: A | Discharge status: Home / Self Care | Payer: Medicare Other | Attending: Gastroenterology | Admitting: Gastroenterology

## 2019-01-15 DIAGNOSIS — R1310 Dysphagia, unspecified: Secondary | ICD-10-CM | POA: Diagnosis not present

## 2019-01-15 DIAGNOSIS — Z833 Family history of diabetes mellitus: Secondary | ICD-10-CM | POA: Diagnosis not present

## 2019-01-15 DIAGNOSIS — Z882 Allergy status to sulfonamides status: Secondary | ICD-10-CM | POA: Diagnosis not present

## 2019-01-15 DIAGNOSIS — Z888 Allergy status to other drugs, medicaments and biological substances status: Secondary | ICD-10-CM | POA: Diagnosis not present

## 2019-01-15 DIAGNOSIS — Z7951 Long term (current) use of inhaled steroids: Secondary | ICD-10-CM | POA: Insufficient documentation

## 2019-01-15 DIAGNOSIS — R1011 Right upper quadrant pain: Secondary | ICD-10-CM

## 2019-01-15 DIAGNOSIS — Z881 Allergy status to other antibiotic agents status: Secondary | ICD-10-CM | POA: Insufficient documentation

## 2019-01-15 DIAGNOSIS — R1013 Epigastric pain: Secondary | ICD-10-CM | POA: Diagnosis not present

## 2019-01-15 DIAGNOSIS — Z87442 Personal history of urinary calculi: Secondary | ICD-10-CM | POA: Diagnosis not present

## 2019-01-15 DIAGNOSIS — M5136 Other intervertebral disc degeneration, lumbar region: Secondary | ICD-10-CM | POA: Insufficient documentation

## 2019-01-15 DIAGNOSIS — J449 Chronic obstructive pulmonary disease, unspecified: Secondary | ICD-10-CM | POA: Insufficient documentation

## 2019-01-15 DIAGNOSIS — Z79899 Other long term (current) drug therapy: Secondary | ICD-10-CM | POA: Diagnosis not present

## 2019-01-15 DIAGNOSIS — Z87440 Personal history of urinary (tract) infections: Secondary | ICD-10-CM | POA: Diagnosis not present

## 2019-01-15 DIAGNOSIS — F17200 Nicotine dependence, unspecified, uncomplicated: Secondary | ICD-10-CM | POA: Diagnosis not present

## 2019-01-15 DIAGNOSIS — Z791 Long term (current) use of non-steroidal anti-inflammatories (NSAID): Secondary | ICD-10-CM | POA: Insufficient documentation

## 2019-01-15 DIAGNOSIS — Z91040 Latex allergy status: Secondary | ICD-10-CM | POA: Insufficient documentation

## 2019-01-15 DIAGNOSIS — M1990 Unspecified osteoarthritis, unspecified site: Secondary | ICD-10-CM | POA: Diagnosis not present

## 2019-01-15 DIAGNOSIS — Z8249 Family history of ischemic heart disease and other diseases of the circulatory system: Secondary | ICD-10-CM | POA: Insufficient documentation

## 2019-01-15 DIAGNOSIS — Z90710 Acquired absence of both cervix and uterus: Secondary | ICD-10-CM | POA: Diagnosis not present

## 2019-01-15 DIAGNOSIS — Z885 Allergy status to narcotic agent status: Secondary | ICD-10-CM | POA: Diagnosis not present

## 2019-01-15 HISTORY — DX: Cervicalgia: M54.2

## 2019-01-15 HISTORY — DX: Other chronic pain: G89.29

## 2019-01-15 HISTORY — PX: ESOPHAGOGASTRODUODENOSCOPY (EGD) WITH PROPOFOL: SHX5813

## 2019-01-15 SURGERY — ESOPHAGOGASTRODUODENOSCOPY (EGD) WITH PROPOFOL
Anesthesia: General

## 2019-01-15 MED ORDER — LIDOCAINE HCL (CARDIAC) PF 100 MG/5ML IV SOSY
PREFILLED_SYRINGE | INTRAVENOUS | Status: DC | PRN
  Administered 2019-01-15: 40 mg via INTRAVENOUS

## 2019-01-15 MED ORDER — PROPOFOL 10 MG/ML IV BOLUS
INTRAVENOUS | Status: DC | PRN
  Administered 2019-01-15: 100 mg via INTRAVENOUS

## 2019-01-15 MED ORDER — EPHEDRINE SULFATE 50 MG/ML IJ SOLN
INTRAMUSCULAR | Status: AC
  Filled 2019-01-15: qty 1

## 2019-01-15 MED ORDER — GLYCOPYRROLATE 0.2 MG/ML IJ SOLN
INTRAMUSCULAR | Status: AC
  Filled 2019-01-15: qty 1

## 2019-01-15 MED ORDER — SODIUM CHLORIDE (PF) 0.9 % IJ SOLN
INTRAMUSCULAR | Status: AC
  Filled 2019-01-15: qty 10

## 2019-01-15 MED ORDER — GLYCOPYRROLATE 0.2 MG/ML IJ SOLN
INTRAMUSCULAR | Status: DC | PRN
  Administered 2019-01-15: 0.2 mg via INTRAVENOUS

## 2019-01-15 MED ORDER — PROPOFOL 500 MG/50ML IV EMUL
INTRAVENOUS | Status: DC | PRN
  Administered 2019-01-15: 175 ug/kg/min via INTRAVENOUS

## 2019-01-15 MED ORDER — PROPOFOL 500 MG/50ML IV EMUL
INTRAVENOUS | Status: AC
  Filled 2019-01-15: qty 50

## 2019-01-15 MED ORDER — MIDAZOLAM HCL 2 MG/2ML IJ SOLN
INTRAMUSCULAR | Status: DC | PRN
  Administered 2019-01-15: 2 mg via INTRAVENOUS

## 2019-01-15 MED ORDER — MIDAZOLAM HCL 2 MG/2ML IJ SOLN
INTRAMUSCULAR | Status: AC
  Filled 2019-01-15: qty 2

## 2019-01-15 MED ORDER — SODIUM CHLORIDE 0.9 % IV SOLN
INTRAVENOUS | Status: DC
  Administered 2019-01-15: 11:00:00 via INTRAVENOUS

## 2019-01-15 NOTE — Op Note (Signed)
[facility] Gastroenterology Patient Name: Brenda Berry Procedure Date: 01/15/2019 11:08 AM MRN: [id number] Account #: [id number] Date of Birth: March 27, 1971 Admit Type: Outpatient Age: 48 Room: [facility] ENDO ROOM 2 Gender: Female Note Status: Finalized Procedure:            Upper GI endoscopy Indications:          Epigastric abdominal pain, Abdominal pain in the right                        upper quadrant, Dysphagia Providers:            Lin Landsman MD, MD Referring MD:         Juline Patch, MD (Referring MD) Medicines:            Monitored Anesthesia Care Complications:        No immediate complications. Estimated blood loss: None. Procedure:            Pre-Anesthesia Assessment:                       - Prior to the procedure, a History and Physical was                        performed, and patient medications and allergies were                        reviewed. The patient is competent. The risks and                        benefits of the procedure and the sedation options and                        risks were discussed with the patient. All questions                        were answered and informed consent was obtained.                        Patient identification and proposed procedure were                        verified by the physician, the nurse, the                        anesthesiologist, the anesthetist and the technician in                        the pre-procedure area in the procedure room in the                        endoscopy suite. Mental Status Examination: alert and                        oriented. Airway Examination: normal oropharyngeal                        airway and neck mobility. Respiratory Examination:                        clear to auscultation. CV Examination: normal.  Prophylactic Antibiotics: The patient does not require                        prophylactic antibiotics. Prior Anticoagulants: The               patient has taken no previous anticoagulant or                        antiplatelet agents. ASA Grade Assessment: II - A                        patient with mild systemic disease. After reviewing the                        risks and benefits, the patient was deemed in                        satisfactory condition to undergo the procedure. The                        anesthesia plan was to use monitored anesthesia care                        (MAC). Immediately prior to administration of                        medications, the patient was re-assessed for adequacy                        to receive sedatives. The heart rate, respiratory rate,                        oxygen saturations, blood pressure, adequacy of                        pulmonary ventilation, and response to care were                        monitored throughout the procedure. The physical status                        of the patient was re-assessed after the procedure.                       After obtaining informed consent, the endoscope was                        passed under direct vision. Throughout the procedure,                        the patient's blood pressure, pulse, and oxygen                        saturations were monitored continuously. The Endoscope                        was introduced through the mouth, and advanced to the                        second part of duodenum. The upper GI  endoscopy was                        accomplished without difficulty. The patient tolerated                        the procedure well. Findings:      The duodenal bulb and second portion of the duodenum were normal.       Biopsies for histology were taken with a cold forceps for evaluation of       celiac disease.      The entire examined stomach was normal. Biopsies were taken with a cold       forceps for Helicobacter pylori testing.      The cardia and gastric fundus were normal on retroflexion.      Esophagogastric  landmarks were identified: the gastroesophageal junction       was found at 38 cm from the incisors.      The gastroesophageal junction and examined esophagus were normal.       Biopsies were taken with a cold forceps for histology. Impression:           - Normal duodenal bulb and second portion of the                        duodenum. Biopsied.                       - Normal stomach. Biopsied.                       - Esophagogastric landmarks identified.                       - Normal gastroesophageal junction and esophagus.                        Biopsied. Recommendation:       - Await pathology results.                       - Continue present medications.                       - Return to my office as previously scheduled. Procedure Code(s):    --- Professional ---                       [phone], Esophagogastroduodenoscopy, flexible, transoral;                        with biopsy, single or multiple Diagnosis Code(s):    --- Professional ---                       R10.13, Epigastric pain                       R10.11, Right upper quadrant pain                       R13.10, Dysphagia, unspecified CPT copyright 2019 American Medical Association. All rights reserved. The codes documented in this report are preliminary and upon coder review may  be revised to meet current compliance requirements. Dr. Ulyess Mort Lin Landsman MD, MD 01/15/2019 11:35:28  AM This report has been signed electronically. Number of Addenda: 0 Note Initiated On: 01/15/2019 11:08 AM Estimated Blood Loss: Estimated blood loss: none.      [facility]

## 2019-01-15 NOTE — Transfer of Care (Signed)
Immediate Anesthesia Transfer of Care Note  Patient: Brenda Berry  Procedure(s) Performed: Procedure(s): ESOPHAGOGASTRODUODENOSCOPY (EGD) WITH PROPOFOL (N/A)  Patient Location: PACU and Endoscopy Unit  Anesthesia Type:General  Level of Consciousness: sedated  Airway & Oxygen Therapy: Patient Spontanous Breathing and Patient connected to nasal cannula oxygen  Post-op Assessment: Report given to RN and Post -op Vital signs reviewed and stable  Post vital signs: Reviewed and stable  Last Vitals:  Vitals:   01/15/19 1130 01/15/19 1138  BP: 125/76 125/76  Pulse: 93 91  Resp: 18 (!) 22  Temp: (!) 36.4 C   SpO2: 71% 24%    Complications: No apparent anesthesia complications

## 2019-01-15 NOTE — Anesthesia Procedure Notes (Signed)
Date/Time: 01/15/2019 11:24 AM Performed by: Doreen Salvage, CRNA Pre-anesthesia Checklist: Patient identified, Emergency Drugs available, Suction available and Patient being monitored Patient Re-evaluated:Patient Re-evaluated prior to induction Oxygen Delivery Method: Nasal cannula Induction Type: IV induction Dental Injury: Teeth and Oropharynx as per pre-operative assessment  Comments: Nasal cannula with etCO2 monitoring

## 2019-01-15 NOTE — Anesthesia Preprocedure Evaluation (Signed)
Anesthesia Evaluation  Patient identified by MRN, date of birth, ID band Patient awake    Reviewed: Allergy & Precautions, NPO status , Patient's Chart, lab work & pertinent test results  Airway Mallampati: II       Dental   Pulmonary COPD, Current Smoker,    Pulmonary exam normal        Cardiovascular negative cardio ROS Normal cardiovascular exam     Neuro/Psych  Neuromuscular disease negative psych ROS   GI/Hepatic Neg liver ROS, hiatal hernia,   Endo/Other  negative endocrine ROS  Renal/GU negative Renal ROS  negative genitourinary   Musculoskeletal  (+) Arthritis , Osteoarthritis,    Abdominal Normal abdominal exam  (+)   Peds negative pediatric ROS (+)  Hematology negative hematology ROS (+)   Anesthesia Other Findings   Reproductive/Obstetrics                             Anesthesia Physical Anesthesia Plan  ASA: II  Anesthesia Plan: General   Post-op Pain Management:    Induction: Intravenous  PONV Risk Score and Plan:   Airway Management Planned: Nasal Cannula  Additional Equipment:   Intra-op Plan:   Post-operative Plan:   Informed Consent: I have reviewed the patients History and Physical, chart, labs and discussed the procedure including the risks, benefits and alternatives for the proposed anesthesia with the patient or authorized representative who has indicated his/her understanding and acceptance.     Dental advisory given  Plan Discussed with: Surgeon and CRNA  Anesthesia Plan Comments:         Anesthesia Quick Evaluation

## 2019-01-15 NOTE — H&P (Signed)
Brenda Darby, MD [address]  [place]  [place], [place] [address]  Main: [phone]  Fax: [phone] Pager: [phone]  Primary Care Physician:  Juline Patch, MD Primary Gastroenterologist:  Dr. Cephas Berry  Pre-Procedure History & Physical: HPI:  Brenda Berry is a 48 y.o. female is here for an endoscopy.   Past Medical History:  Diagnosis Date  . Arthritis   . Chronic neck and back pain   . Degenerative disc disease, cervical   . Degenerative disc disease, lumbar   . Hiatal hernia 04/26/2015  . History of bronchitis 04/26/2015  . History of hematuria 04/26/2015  . History of nephrolithiasis 04/26/2015  . History of recurrent UTI (urinary tract infection) 04/26/2015  . Interstitial cystitis   . Status post hysterectomy 04/26/2015    Past Surgical History:  Procedure Laterality Date  . BACK SURGERY    . BLADDER SURGERY    . VAGINAL HYSTERECTOMY      Prior to Admission medications   Medication Sig Start Date End Date Taking? Authorizing Provider  acetaminophen (TYLENOL) 325 MG tablet Take 650 mg by mouth every 4 (four) hours as needed.  01/31/15  Yes [provider]  albuterol (PROVENTIL HFA;VENTOLIN HFA) 108 (90 Base) MCG/ACT inhaler Inhale 2 puffs into the lungs every 6 (six) hours as needed for wheezing or shortness of breath. 08/12/18  Yes Juline Patch, MD  gabapentin (NEURONTIN) 100 MG capsule TK ONE C PO TID 12/16/18  Yes [provider]  gabapentin (NEURONTIN) 300 MG capsule Take 1 capsule by mouth 3 (three) times daily. 08/11/18  Yes [provider]  oxybutynin (DITROPAN) 5 MG tablet Take 1 tablet by mouth as needed. 01/08/18  Yes [provider]  pantoprazole (PROTONIX) 40 MG tablet Take 1 tablet (40 mg total) by mouth daily. 01/08/19  Yes Juline Patch, MD  tiZANidine (ZANAFLEX) 4 MG tablet Take 1 tablet by mouth 3 (three) times daily. 08/11/18  Yes [provider]  amitriptyline (ELAVIL) 25 MG tablet  Take 25 mg by mouth at bedtime.    [provider]  clotrimazole-betamethasone (LOTRISONE) cream Apply 1 application topically 2 (two) times daily. 01/08/19   Juline Patch, MD  cyclobenzaprine (FLEXERIL) 10 MG tablet Take by mouth.    [provider]  hydrOXYzine (ATARAX/VISTARIL) 25 MG tablet Take by mouth. 02/18/13   [provider]  Oxycodone HCl 10 MG TABS Take by mouth.    [provider]  pentosan polysulfate (ELMIRON) 100 MG capsule Take by mouth. 02/18/13   [provider]    Allergies as of 01/09/2019 - Review Complete 01/09/2019  Allergen Reaction Noted  . Ciprofloxacin  12/06/2014  . Demerol [meperidine]  12/06/2014  . Ibuprofen  12/06/2014  . Iodinated diagnostic agents  07/25/2015  . Latex  12/06/2014  . Nifedipine  12/06/2014  . Other Hives 02/18/2013  . Sulfa antibiotics  12/06/2014  . Terbutaline  12/06/2014    Family History  Problem Relation Age of Onset  . Diabetes Mother   . Hyperlipidemia Mother   . Hypertension Mother   . Heart disease Father     Social History   Socioeconomic History  . Marital status: Divorced    Spouse name: Not on file  . Number of children: Not on file  . Years of education: Not on file  . Highest education level: Not on file  Occupational History  . Not on file  Social Needs  . Financial resource strain:  Not on file  . Food insecurity    Worry: Not on file    Inability: Not on file  . Transportation needs    Medical: Not on file    Non-medical: Not on file  Tobacco Use  . Smoking status: Current Every Day Smoker    Packs/day: 1.00  . Smokeless tobacco: Never Used  Substance and Sexual Activity  . Alcohol use: Yes    Alcohol/week: 0.0 standard drinks    Comment: occ   . Drug use: No  . Sexual activity: Yes  Lifestyle  . Physical activity    Days per week: Not on file    Minutes per session: Not on file  . Stress: Not on file  Relationships  . Social Wellsite geologistconnections     Talks on phone: Not on file    Gets together: Not on file    Attends religious service: Not on file    Active member of club or organization: Not on file    Attends meetings of clubs or organizations: Not on file    Relationship status: Not on file  . Intimate partner violence    Fear of current or ex partner: Not on file    Emotionally abused: Not on file    Physically abused: Not on file    Forced sexual activity: Not on file  Other Topics Concern  . Not on file  Social History Narrative  . Not on file    Review of Systems: See HPI, otherwise negative ROS  Physical Exam: BP (!) 120/56   Pulse 96   Temp (!) 97.5 F (36.4 C) (Tympanic)   Resp 18   Ht 5\' 2"  (1.575 m)   Wt 72.1 kg   SpO2 99%   BMI 29.08 kg/m  General:   Alert,  pleasant and cooperative in NAD Head:  Normocephalic and atraumatic. Neck:  Supple; no masses or thyromegaly. Lungs:  Clear throughout to auscultation.    Heart:  Regular rate and rhythm. Abdomen:  Soft, nontender and nondistended. Normal bowel sounds, without guarding, and without rebound.   Neurologic:  Alert and  oriented x4;  grossly normal neurologically.  Impression/Plan: Brenda Berry is here for an endoscopy to be performed for dysphagia, epigastric and RUQ pain  Risks, benefits, limitations, and alternatives regarding  endoscopy have been reviewed with the patient.  Questions have been answered.  All parties agreeable.   Brenda Donathohini Jessica Seidman, MD  01/15/2019, 11:12 AM

## 2019-01-15 NOTE — Anesthesia Post-op Follow-up Note (Signed)
Anesthesia QCDR form completed.        

## 2019-01-16 ENCOUNTER — Encounter: Payer: Self-pay | Admitting: Gastroenterology

## 2019-01-16 NOTE — Anesthesia Postprocedure Evaluation (Signed)
Anesthesia Post Note  Patient: Brenda Berry  Procedure(s) Performed: ESOPHAGOGASTRODUODENOSCOPY (EGD) WITH PROPOFOL (N/A )  Patient location during evaluation: Endoscopy Anesthesia Type: General Level of consciousness: awake and alert and oriented Pain management: pain level controlled Vital Signs Assessment: post-procedure vital signs reviewed and stable Respiratory status: spontaneous breathing Cardiovascular status: blood pressure returned to baseline Anesthetic complications: no     Last Vitals:  Vitals:   01/15/19 1138 01/15/19 1148  BP: 125/76 116/72  Pulse: 91   Resp: (!) 22   Temp:    SpO2: 99%     Last Pain:  Vitals:   01/16/19 0730  TempSrc:   PainSc: 0-No pain                 Michaelpaul Apo

## 2019-01-19 LAB — SURGICAL PATHOLOGY

## 2019-02-05 DIAGNOSIS — Z13820 Encounter for screening for osteoporosis: Secondary | ICD-10-CM | POA: Diagnosis not present

## 2019-02-05 DIAGNOSIS — I7300 Raynaud's syndrome without gangrene: Secondary | ICD-10-CM | POA: Diagnosis not present

## 2019-02-05 DIAGNOSIS — M47816 Spondylosis without myelopathy or radiculopathy, lumbar region: Secondary | ICD-10-CM | POA: Diagnosis not present

## 2019-02-05 DIAGNOSIS — K529 Noninfective gastroenteritis and colitis, unspecified: Secondary | ICD-10-CM | POA: Diagnosis not present

## 2019-02-05 DIAGNOSIS — M2550 Pain in unspecified joint: Secondary | ICD-10-CM | POA: Diagnosis not present

## 2019-02-20 DIAGNOSIS — I7300 Raynaud's syndrome without gangrene: Secondary | ICD-10-CM | POA: Diagnosis not present

## 2019-02-20 DIAGNOSIS — M25562 Pain in left knee: Secondary | ICD-10-CM | POA: Diagnosis not present

## 2019-02-20 DIAGNOSIS — M47816 Spondylosis without myelopathy or radiculopathy, lumbar region: Secondary | ICD-10-CM | POA: Diagnosis not present

## 2019-02-25 ENCOUNTER — Encounter: Payer: Self-pay | Admitting: Gastroenterology

## 2019-02-25 ENCOUNTER — Ambulatory Visit (INDEPENDENT_AMBULATORY_CARE_PROVIDER_SITE_OTHER): Payer: Medicare Other | Admitting: Gastroenterology

## 2019-02-25 ENCOUNTER — Other Ambulatory Visit: Payer: Self-pay

## 2019-02-25 VITALS — BP 112/67 | HR 98 | Temp 96.8°F | Ht 62.0 in | Wt 164.8 lb

## 2019-02-25 DIAGNOSIS — R1310 Dysphagia, unspecified: Secondary | ICD-10-CM | POA: Diagnosis not present

## 2019-02-25 MED ORDER — OMEPRAZOLE 40 MG PO CPDR: 40.0000 mg | DELAYED_RELEASE_CAPSULE | Freq: Every day | ORAL | 2 refills | Status: DC

## 2019-02-25 MED ORDER — AMITRIPTYLINE HCL 25 MG PO TABS: 25.0000 mg | ORAL_TABLET | Freq: Every day | ORAL | 2 refills | Status: DC

## 2019-02-25 NOTE — Progress Notes (Signed)
Brenda Repressohini R Vanga, MD [address]  [address]  [place], [place] [address]  Main: [phone]  Fax: [phone]    Gastroenterology Consultation  Referring Provider:     Duanne LimerickJones, Deanna C, MD Primary Care Physician:  Duanne LimerickJones, Deanna C, MD Primary Gastroenterologist:  Dr. Arlyss Repressohini R Berry Reason for Consultation:     Difficulty swallowing, epigastric pain        HPI:   Brenda Berry is a 48 y.o. female referred by Dr. Duanne LimerickJones, Deanna C, MD  for consultation & management of difficulty swallowing, epigastric pain.  Patient reports that she has been experiencing difficulty swallowing intermittently for a long time but for the last few months, it has become regular, almost daily basis and assisted with both solids and liquids.  She has choking sensation and feels like spasm.  Although, she did not have episodes of regurgitation or throwing up.  She did not lose weight.  She does not feel anxious or stressed or depressed.  She also reports epigastric burning pain associated with intermittent bloating.  She is started on Protonix 40 mg by Dr. Yetta BarreJones yesterday.  She also reports intermittent right upper quadrant pain, sporadic with no particular association to food or radiation to the back or shoulder.  She does have chronic severe back issues both in cervical spine and lumbar spine.  She is wondering if her swallowing issues are related to cervical spine.  She also reports altered bowel habits which is chronic since she is young and was told she has irritable bowel syndrome.  She does acknowledge drinking sodas regularly  Follow-up visit 02/25/2019 Patient underwent EGD which was unremarkable, esophageal, gastric and duodenal biopsies were normal She is taking Protonix 40 mg twice daily.  However, she continues to have sensation of food stuck in her lower chest associated with chest discomfort.  Her weight has been stable  NSAIDs: None  Antiplts/Anticoagulants/Anti thrombotics: None  GI Procedures:   EGD 01/15/2019 - Normal duodenal bulb and second portion of the duodenum. Biopsied. - Normal stomach. Biopsied. - Esophagogastric landmarks identified. - Normal gastroesophageal junction and esophagus. Biopsied.  DIAGNOSIS:  A. DUODENUM; COLD BIOPSY:  - UNREMARKABLE DUODENAL MUCOSA.  - NEGATIVE FOR FEATURES OF CELIAC DISEASE.  - NEGATIVE FOR DYSPLASIA AND MALIGNANCY.   B. STOMACH; COLD BIOPSY:  - UNREMARKABLE GASTRIC ANTRAL AND OXYNTIC MUCOSA.  - NEGATIVE FOR ACTIVE INFLAMMATION AND H. PYLORI.  - NEGATIVE FOR INTESTINAL METAPLASIA, DYSPLASIA, AND MALIGNANCY.   C. ESOPHAGUS; COLD BIOPSY:  - UNREMARKABLE SQUAMOUS MUCOSA.  - NEGATIVE FOR INFLAMMATION, INTESTINAL METAPLASIA, DYSPLASIA, AND  MALIGNANCY.   She denies family history of GI malignancy, IBD She does have family history of IBS  Past Medical History:  Diagnosis Date  . Arthritis   . Chronic neck and back pain   . Degenerative disc disease, cervical   . Degenerative disc disease, lumbar   . Hiatal hernia 04/26/2015  . History of bronchitis 04/26/2015  . History of hematuria 04/26/2015  . History of nephrolithiasis 04/26/2015  . History of recurrent UTI (urinary tract infection) 04/26/2015  . Interstitial cystitis   . Status post hysterectomy 04/26/2015    Past Surgical History:  Procedure Laterality Date  . BACK SURGERY    . BLADDER SURGERY    . ESOPHAGOGASTRODUODENOSCOPY (EGD) WITH PROPOFOL N/A 01/15/2019   Procedure: ESOPHAGOGASTRODUODENOSCOPY (EGD) WITH PROPOFOL;  Surgeon: Toney ReilVanga, Rohini Reddy, MD;  [facility];  Service: Gastroenterology;  Laterality: N/A;  . VAGINAL HYSTERECTOMY  Current Outpatient Medications:  .  acetaminophen (TYLENOL) 325 MG tablet, Take 650 mg by mouth every 4 (four) hours as needed. , Disp: , Rfl:  .  albuterol (PROVENTIL HFA;VENTOLIN HFA) 108 (90 Base) MCG/ACT inhaler, Inhale 2 puffs into the lungs every 6 (six) hours as needed for wheezing or shortness of breath.,  Disp: 1 Inhaler, Rfl: 2 .  amitriptyline (ELAVIL) 25 MG tablet, Take 1 tablet (25 mg total) by mouth at bedtime., Disp: 30 tablet, Rfl: 2 .  clotrimazole-betamethasone (LOTRISONE) cream, Apply 1 application topically 2 (two) times daily., Disp: 30 g, Rfl: 0 .  cyclobenzaprine (FLEXERIL) 10 MG tablet, Take by mouth., Disp: , Rfl:  .  gabapentin (NEURONTIN) 100 MG capsule, TK ONE C PO TID, Disp: , Rfl:  .  gabapentin (NEURONTIN) 300 MG capsule, Take 1 capsule by mouth 3 (three) times daily., Disp: , Rfl:  .  hydrOXYzine (ATARAX/VISTARIL) 25 MG tablet, Take by mouth., Disp: , Rfl:  .  oxybutynin (DITROPAN) 5 MG tablet, Take 1 tablet by mouth as needed., Disp: , Rfl:  .  Oxycodone HCl 10 MG TABS, Take by mouth., Disp: , Rfl:  .  pantoprazole (PROTONIX) 40 MG tablet, Take 1 tablet (40 mg total) by mouth daily., Disp: 30 tablet, Rfl: 3 .  pentosan polysulfate (ELMIRON) 100 MG capsule, Take by mouth., Disp: , Rfl:  .  tiZANidine (ZANAFLEX) 4 MG tablet, Take 1 tablet by mouth 3 (three) times daily., Disp: , Rfl:  .  omeprazole (PRILOSEC) 40 MG capsule, Take 1 capsule (40 mg total) by mouth daily before breakfast., Disp: 30 capsule, Rfl: 2    Family History  Problem Relation Age of Onset  . Diabetes Mother   . Hyperlipidemia Mother   . Hypertension Mother   . Heart disease Father      Social History   Tobacco Use  . Smoking status: Current Every Day Smoker    Packs/day: 1.00  . Smokeless tobacco: Never Used  Substance Use Topics  . Alcohol use: Yes    Alcohol/week: 0.0 standard drinks    Comment: occ   . Drug use: No    Allergies as of 02/25/2019 - Review Complete 02/25/2019  Allergen Reaction Noted  . Ciprofloxacin  12/06/2014  . Demerol [meperidine]  12/06/2014  . Ibuprofen  12/06/2014  . Iodinated diagnostic agents  07/25/2015  . Latex  12/06/2014  . Nifedipine  12/06/2014  . Other Hives 02/18/2013  . Sulfa antibiotics  12/06/2014  . Terbutaline  12/06/2014    Review of  Systems:    All systems reviewed and negative except where noted in HPI.   Physical Exam:  BP 112/67   Pulse 98   Temp (!) 96.8 F (36 C) (Temporal)   Ht 5\' 2"  (1.575 m)   Wt 164 lb 12.8 oz (74.8 kg)   BMI 30.14 kg/m  No LMP recorded. Patient has had a hysterectomy.  General:   Alert,  Well-developed, well-nourished, pleasant and cooperative in NAD Head:  Normocephalic and atraumatic. Eyes:  Sclera clear, no icterus.   Conjunctiva pink. Ears:  Normal auditory acuity. Nose:  No deformity, discharge, or lesions. Mouth:  No deformity or lesions,oropharynx pink & moist. Neck:  Supple; no masses or thyromegaly. Lungs:  Respirations even and unlabored.  Clear throughout to auscultation.   No wheezes, crackles, or rhonchi. No acute distress. Heart:  Regular rate and rhythm; no murmurs, clicks, rubs, or gallops. Abdomen:  Normal bowel sounds. Soft, mild epigastric tenderness and non-distended without  masses, hepatosplenomegaly or hernias noted.  No guarding or rebound tenderness.   Rectal: Not performed Msk:  Symmetrical without gross deformities. Good, equal movement & strength bilaterally. Pulses:  Normal pulses noted. Extremities:  No clubbing or edema.  No cyanosis. Neurologic:  Alert and oriented x3;  grossly normal neurologically. Skin:  Intact without significant lesions or rashes. No jaundice. Psych:  Alert and cooperative. Normal mood and affect.  Imaging Studies: No recent abdominal imaging  Assessment and Plan:   Brenda Berry is a 48 y.o. female with tobacco use, chronic back issues, degenerative disc disease seen for follow-up of dysphagia, epigastric pain and right upper quadrant pain.  Her primary concern today is persistence of dysphagia.  EGD was unremarkable, right upper quadrant ultrasound was normal with no evidence of cholelithiasis.  Her symptoms are most likely functional, counseled her about the same  Switch from Protonix to omeprazole 40 mg daily Trial of  amitriptyline 25 mg at bedtime If her dysphagia is persistent, next it would be esophageal manometry   Follow up in 2 months   Brenda Repress, MD

## 2019-03-10 DIAGNOSIS — M25562 Pain in left knee: Secondary | ICD-10-CM | POA: Diagnosis not present

## 2019-03-10 DIAGNOSIS — M6281 Muscle weakness (generalized): Secondary | ICD-10-CM | POA: Diagnosis not present

## 2019-04-21 ENCOUNTER — Encounter: Payer: Self-pay | Admitting: *Deleted

## 2019-05-01 ENCOUNTER — Ambulatory Visit: Payer: Medicare Other | Admitting: Gastroenterology

## 2019-05-05 DIAGNOSIS — G5793 Unspecified mononeuropathy of bilateral lower limbs: Secondary | ICD-10-CM | POA: Diagnosis not present

## 2019-05-05 DIAGNOSIS — G894 Chronic pain syndrome: Secondary | ICD-10-CM | POA: Diagnosis not present

## 2019-05-05 DIAGNOSIS — M25562 Pain in left knee: Secondary | ICD-10-CM | POA: Diagnosis not present

## 2019-05-05 DIAGNOSIS — M47816 Spondylosis without myelopathy or radiculopathy, lumbar region: Secondary | ICD-10-CM | POA: Diagnosis not present

## 2019-05-05 DIAGNOSIS — M94262 Chondromalacia, left knee: Secondary | ICD-10-CM | POA: Diagnosis not present

## 2019-05-06 ENCOUNTER — Other Ambulatory Visit (HOSPITAL_COMMUNITY): Payer: Self-pay | Admitting: Internal Medicine

## 2019-05-06 ENCOUNTER — Other Ambulatory Visit: Payer: Self-pay | Admitting: Internal Medicine

## 2019-05-06 DIAGNOSIS — M25562 Pain in left knee: Secondary | ICD-10-CM

## 2019-05-20 ENCOUNTER — Other Ambulatory Visit: Payer: Self-pay

## 2019-05-20 ENCOUNTER — Ambulatory Visit
Admission: RE | Admit: 2019-05-20 | Discharge: 2019-05-20 | Disposition: A | Discharge status: Home / Self Care | Payer: Medicare Other | Source: Ambulatory Visit | Attending: Internal Medicine | Admitting: Internal Medicine

## 2019-05-20 DIAGNOSIS — M25562 Pain in left knee: Secondary | ICD-10-CM | POA: Diagnosis not present

## 2019-05-20 DIAGNOSIS — M1712 Unilateral primary osteoarthritis, left knee: Secondary | ICD-10-CM | POA: Diagnosis not present

## 2019-05-21 ENCOUNTER — Other Ambulatory Visit: Payer: Self-pay | Admitting: Gastroenterology

## 2019-05-21 DIAGNOSIS — R1310 Dysphagia, unspecified: Secondary | ICD-10-CM

## 2019-06-17 ENCOUNTER — Ambulatory Visit: Payer: Medicare Other | Admitting: Gastroenterology

## 2019-06-17 ENCOUNTER — Encounter: Payer: Self-pay | Admitting: *Deleted

## 2019-06-17 ENCOUNTER — Other Ambulatory Visit: Payer: Self-pay

## 2019-06-24 DIAGNOSIS — M47816 Spondylosis without myelopathy or radiculopathy, lumbar region: Secondary | ICD-10-CM | POA: Diagnosis not present

## 2019-06-24 DIAGNOSIS — G894 Chronic pain syndrome: Secondary | ICD-10-CM | POA: Diagnosis not present

## 2019-06-24 DIAGNOSIS — M1712 Unilateral primary osteoarthritis, left knee: Secondary | ICD-10-CM | POA: Diagnosis not present

## 2019-08-05 ENCOUNTER — Encounter: Payer: Self-pay | Admitting: Student in an Organized Health Care Education/Training Program

## 2019-08-05 ENCOUNTER — Telehealth: Payer: Self-pay | Admitting: *Deleted

## 2019-08-05 DIAGNOSIS — G894 Chronic pain syndrome: Secondary | ICD-10-CM | POA: Diagnosis not present

## 2019-08-05 DIAGNOSIS — M778 Other enthesopathies, not elsewhere classified: Secondary | ICD-10-CM | POA: Insufficient documentation

## 2019-08-05 DIAGNOSIS — M47816 Spondylosis without myelopathy or radiculopathy, lumbar region: Secondary | ICD-10-CM | POA: Diagnosis not present

## 2019-08-05 NOTE — Telephone Encounter (Signed)
Voicemail left with patient to please call re; new patient appt on 08/06/19.

## 2019-08-05 NOTE — Progress Notes (Signed)
Patient: Brenda Berry  Service Category: E/M  Provider: Gillis Santa, MD  DOB: May 22, 1971  DOS: 08/06/2019  Location: Office  MRN: [id number]  Setting: Ambulatory outpatient  Referring Provider: Reche Dixon, PA-C  Type: New Patient  Specialty: Interventional Pain Management  PCP: Brenda Patch, MD  Location: Home  Delivery: TeleHealth     Virtual Encounter - Pain Management PROVIDER NOTE: Information contained herein reflects review and annotations entered in association with encounter. Interpretation of such information and data should be left to medically-trained personnel. Information provided to patient can be located elsewhere in the medical record under "Patient Instructions". Document created using STT-dictation technology, any transcriptional errors that may result from process are unintentional.    Contact & Pharmacy Preferred: [phone] Home: [phone] (home) Mobile: [phone] (mobile) E-mail: [email or web address]  Brenda Berry, Bren[address] AT [place] [address] Phone: [phone] Fax: [phone]   Pre-screening note:  Our staff contacted Brenda Berry and offered her an "in person", "face-to-face" appointment versus a telephone encounter. She indicated preferring the telephone encounter, at this time.  Primary Reason(s) for Visit: Tele-Encounter for initial evaluation of one or more chronic problems (new to examiner) potentially causing chronic pain, and posing a threat to normal musculoskeletal function. (Level of risk: High) CC: low back and leg pain (R>L)  I contacted Brenda Berry on 08/06/2019 via telephone.      I clearly identified myself as Brenda Santa, MD. I verified that I was speaking with the correct person using two identifiers (Name: Brenda Berry, and date of birth: 09/11/1970).  This visit was completed via telephone due to the restrictions of the COVID-19  pandemic. All issues as above were discussed and addressed but no physical exam was performed. If it was felt that the patient should be evaluated in the office, they were directed there. The patient verbally consented to this visit. Patient was unable to complete an audio/visual visit due to Technical difficulties and/or Lack of internet. Due to the catastrophic nature of the COVID-19 pandemic, this visit was done through audio contact only.  Location of the patient: home address (see Epic for details)  Location of the provider: office Advanced Informed Consent I sought verbal advanced consent from Brenda Berry for virtual visit interactions. I informed Brenda Berry of possible security and privacy concerns, risks, and limitations associated with providing "not-in-person" medical evaluation and management services. I also informed Brenda Berry of the availability of "in-person" appointments. Finally, I informed her that there would be a charge for the virtual visit and that she could be  personally, fully or partially, financially responsible for it. Brenda Berry expressed understanding and agreed to proceed.   HPI  Brenda Berry is a 49 y.o. year old, female patient, contacted today for an initial evaluation of her chronic pain. She has Chronic pain; Long term current use of opiate analgesic; Long term prescription opiate use; Opiate use (90 MME/Day); Opiate dependence ([place]); Encounter for therapeutic drug level monitoring; Current tobacco use; Chronic low back pain (Location of Primary Source of Pain) (Bilateral) (L>R); Lumbar spondylosis; Chronic radicular Cervical pain (Bilateral C6 Dermatomal distribution); Cervical spondylosis; Chronic radicular Lumbar pain (Location of Tertiary source of pain) (Right S1 Dermatomal pain); Allergy to contrast media (used for diagnostic x-rays); Failed back surgical syndrome (2002 by Dr. March Rummage); Epidural fibrosis; Musculoskeletal pain; Neurogenic pain; Neuropathic pain; Bulging  lumbar disc (L3-4, L4-5, and  L5-S1); Lumbar radiculopathy; Chronic pelvic pain in female; History of endometriosis; Sacrococcygeal pain (tailbone pain/sacrococcygeal neuralgia); Coccygodynia; Herniated cervical disc (C4-5 and C5-6); Tobacco abuse; Chronic obstructive pulmonary disease (COPD) ([place]); Urinary incontinence; History of bronchitis; Hiatal hernia; Pain due to interstitial cystitis; History of recurrent UTI (urinary tract infection); History of nephrolithiasis; History of hematuria; Osteoarthrosis; Status post hysterectomy; Lumbar facet syndrome (Bilateral) (R>L); Chronic neck pain (Location of Secondary source of pain) (Bilateral) (L>R); Dyspepsia; Dysphagia; RUQ pain; and Bilateral primary osteoarthritis of knee on their problem list.   Onset and Duration: Gradual and Present longer than 3 months Cause of pain: Unknown, surgery Severity: Getting worse, NAS-11 at its worse: 10/10, NAS-11 at its best: 5/10, NAS-11 now: 6/10 and NAS-11 on the average: 5/10 Timing: Morning and Not influenced by the time of the day Aggravating Factors: Motion, Prolonged sitting, Prolonged standing, Walking, Walking uphill and Walking downhill Alleviating Factors: Hot packs, Lying down and Medications Associated Problems: Numbness, Weakness, Pain that wakes patient up and Pain that does not allow patient to sleep Quality of Pain: Aching, Constant and Sharp Previous Examinations or Tests: MRI scan and X-rays Previous Treatments: Epidural steroid injections, Narcotic medications and Physical Therapy  1. Low back pain, right side> left. Due to lumbar facet pathology and lumbar disc disease. Radiates into right leg further down than left leg. Bending makes pain worse. Leg weakness with certain positions.Only lumbar spine surgery was when she was 30, L4/5 RIGHT laminectomy.  due to l4/5 herniated disc. Most recent L[facility]MRI done in 2019 at [facility] showed Postoperative changes from right L4-L5 hemilaminotomy. At L4-L5 there is a  new central disc extrusion with moderate spinal canal narrowing and severe bilateral subarticular narrowing contributing to displacement of the traversing bilateral L5 nerve roots. There is also mild bilateral neural foraminal narrowing.  2. Neuropathic pain of both feet. No diabetes. Is on Cymbalta and Gabapentin  3. Left knee pain due to patellofemoral arthritis, unable to perform PT. MRI recently done.  Saw Lars Masson, left knee brace added which helps with support. Denies having injections done in left knee.   4. Hx of Cervical radicular pain s/p cervical epidural steroid injections with Dr Dossie Arbour many years ago.  Patient is on multimodal pain regimen which includes acetaminophen 650 mg every 4 hours as needed, amitriptyline 25 mg nightly, Cymbalta 60 mg daily.  We will focus on interventional pain management.   Meds   Current Outpatient Medications:  .  acetaminophen (TYLENOL) 325 MG tablet, Take 650 mg by mouth every 4 (four) hours as needed. , Disp: , Rfl:  .  albuterol (PROVENTIL HFA;VENTOLIN HFA) 108 (90 Base) MCG/ACT inhaler, Inhale 2 puffs into the lungs every 6 (six) hours as needed for wheezing or shortness of breath., Disp: 1 Inhaler, Rfl: 2 .  amitriptyline (ELAVIL) 25 MG tablet, Take 1 tablet (25 mg total) by mouth at bedtime., Disp: 30 tablet, Rfl: 2 .  clotrimazole-betamethasone (LOTRISONE) cream, Apply 1 application topically 2 (two) times daily., Disp: 30 g, Rfl: 0 .  DULoxetine (CYMBALTA) 60 MG capsule, Take 60 mg by mouth daily. , Disp: , Rfl:  .  gabapentin (NEURONTIN) 600 MG tablet, Take 600 mg by mouth 4 (four) times daily., Disp: , Rfl:  .  omeprazole (PRILOSEC) 40 MG capsule, TAKE ONE CAPSULE BY MOUTH DAILY BEFORE BREAKFAST, Disp: 30 capsule, Rfl: 0 .  tiZANidine (ZANAFLEX) 4 MG tablet, Take 1 tablet by mouth 3 (three) times daily., Disp: , Rfl:  .  cyclobenzaprine (FLEXERIL) 10 MG  tablet, Take by mouth., Disp: , Rfl:  .  DULoxetine (CYMBALTA) 30 MG capsule, Take  30 mg by mouth daily., Disp: , Rfl:  .  gabapentin (NEURONTIN) 100 MG capsule, TK ONE C PO TID, Disp: , Rfl:  .  gabapentin (NEURONTIN) 300 MG capsule, Take 1 capsule by mouth 3 (three) times daily., Disp: , Rfl:  .  hydrOXYzine (ATARAX/VISTARIL) 25 MG tablet, Take by mouth., Disp: , Rfl:  .  oxybutynin (DITROPAN) 5 MG tablet, Take 1 tablet by mouth as needed., Disp: , Rfl:  .  Oxycodone HCl 10 MG TABS, Take by mouth., Disp: , Rfl:  .  pantoprazole (PROTONIX) 40 MG tablet, Take 1 tablet (40 mg total) by mouth daily. (Patient not taking: Reported on 08/05/2019), Disp: 30 tablet, Rfl: 3 .  pentosan polysulfate (ELMIRON) 100 MG capsule, Take by mouth., Disp: , Rfl:   ROS  Cardiovascular: No reported cardiovascular signs or symptoms such as High blood pressure, coronary artery disease, abnormal heart rate or rhythm, heart attack, blood thinner therapy or heart weakness and/or failure Pulmonary or Respiratory: Smoking and Coughing up mucus (Bronchitis) Neurological: No reported neurological signs or symptoms such as seizures, abnormal skin sensations, urinary and/or fecal incontinence, being born with an abnormal open spine and/or a tethered spinal cord Psychological-Psychiatric: Depressed Gastrointestinal: Reflux or heatburn Genitourinary: Interstitial cystitis Hematological: No reported hematological signs or symptoms such as prolonged bleeding, low or poor functioning platelets, bruising or bleeding easily, hereditary bleeding problems, low energy levels due to low hemoglobin or being anemic Endocrine: No reported endocrine signs or symptoms such as high or low blood sugar, rapid heart rate due to high thyroid levels, obesity or weight gain due to slow thyroid or thyroid disease Rheumatologic: No reported rheumatological signs and symptoms such as fatigue, joint pain, tenderness, swelling, redness, heat, stiffness, decreased range of motion, with or without associated rash Musculoskeletal: Negative  for myasthenia gravis, muscular dystrophy, multiple sclerosis or malignant hyperthermia Work History: Disabled  Allergies  Ms. Volkert is allergic to ciprofloxacin; demerol [meperidine]; ibuprofen; iodinated diagnostic agents; latex; lodine [etodolac]; nifedipine; other; sulfa antibiotics; and terbutaline.  Laboratory Chemistry Profile   Renal Lab Results  Component Value Date   BUN 6 07/25/2015   CREATININE 0.61 07/25/2015   GFRAA >60 07/25/2015   GFRNONAA >60 07/25/2015   PROTEINUR Negative 10/28/2011    Electrolytes Lab Results  Component Value Date   NA 140 07/25/2015   K 4.1 07/25/2015   CL 107 07/25/2015   CALCIUM 9.3 07/25/2015   MG 2.2 07/25/2015    Hepatic Lab Results  Component Value Date   AST 21 07/25/2015   ALT 18 07/25/2015   ALBUMIN 4.4 07/25/2015   ALKPHOS 68 07/25/2015    ID Lab Results  Component Value Date   [place] NEGATIVE 01/12/2019    Bone No results found for: [place], [id number], OA4166AY3, KZ6010XN2, 25OHVITD1, 25OHVITD2, 25OHVITD3, TESTOFREE, TESTOSTERONE  Endocrine Lab Results  Component Value Date   GLUCOSE 96 07/25/2015   GLUCOSEU Negative 10/28/2011    Neuropathy No results found for: VITAMINB12, FOLATE, HGBA1C, HIV  CNS No results found for: COLORCSF, APPEARCSF, RBCCOUNTCSF, WBCCSF, POLYSCSF, LYMPHSCSF, EOSCSF, PROTEINCSF, GLUCCSF, JCVIRUS, CSFOLI, IGGCSF, LABACHR, ACETBL, LABACHR, ACETBL  Inflammation (CRP: Acute  ESR: Chronic) Lab Results  Component Value Date   CRP 0.6 07/25/2015   ESRSEDRATE 8 07/25/2015    Rheumatology No results found for: RF, ANA, LABURIC, URICUR, LYMEIGGIGMAB, LYMEABIGMQN, HLAB27  Coagulation Lab Results  Component Value Date   PLT 230 12/22/2013  Cardiovascular Lab Results  Component Value Date   HGB 12.7 12/22/2013   HCT 37.7 12/22/2013    Screening Lab Results  Component Value Date   SARSCOV2NAA NEGATIVE 01/12/2019    Cancer No results found for: CEA, CA125, LABCA2  Allergens No  results found for: ALMOND, APPLE, ASPARAGUS, AVOCADO, BANANA, BARLEY, BASIL, BAYLEAF, GREENBEAN, LIMABEAN, WHITEBEAN, BEEFIGE, REDBEET, BLUEBERRY, BROCCOLI, CABBAGE, MELON, CARROT, CASEIN, CASHEWNUT, CAULIFLOWER, CELERY    Note: Lab results reviewed.   Imaging Review  Cervical Imaging: Cervical MR wo contrast:  Results for orders placed during the hospital encounter of 05/27/15  MR Cervical Spine Wo Contrast   Narrative CLINICAL DATA:  Neck pain and bilateral arm pain, right greater than left. Arm and hand weakness.  EXAM: MRI CERVICAL SPINE WITHOUT CONTRAST  TECHNIQUE: Multiplanar, multisequence MR imaging of the cervical spine was performed. No intravenous contrast was administered.  COMPARISON:  MRI dated 08/25/2012  FINDINGS: The cervical spinal cord and visualized intracranial structures are normal. There is no facet arthritis in the cervical spine.  The patient does have slightly prominent lymph nodes in both sides of the neck. Largest lymph node is 11 mm in diameter on the right on image 4 of series 6. These lymph nodes were present on the prior except exam but have slightly diffusely enlarged. This is nonspecific.  Craniocervical junction through C3-4:  Normal.  C4-5: Resolution of the prominent soft disc protrusion central and slightly to the left on the prior exam. There is a small residual central disc bulge with no neural impingement. Widely patent neural foramina.  C5-6: Tiny disc bulges to the right and left of midline with no neural impingement. Small central disc bulge present on the study of 03/04/2010 is no longer present.  C6-7 through T2-3:  Normal.  IMPRESSION: 1. Resolution of central disc protrusion at C4-5. 2. Minimal degenerative disc disease C4-5 and C5-6 with no neural impingement. Widely patent spinal canal and neural foramina. 3. Slight prominence of lymph nodes in both sides of the neck, slightly increased since the prior exams. This is  nonspecific.   Electronically Signed   By: Lorriane Shire M.D.   On: 05/27/2015 09:15     Lumbosacral Imaging: Lumbar MR wo contrast:  Results for orders placed during the hospital encounter of 05/27/15  MR Lumbar Spine Wo Contrast   Narrative CLINICAL DATA:  Chronic progressive low back pain and bilateral leg pain, right greater than left. Numbness and burning and tingling in both legs.  EXAM: MRI LUMBAR SPINE WITHOUT CONTRAST  TECHNIQUE: Multiplanar, multisequence MR imaging of the lumbar spine was performed. No intravenous contrast was administered.  COMPARISON:  MRIs   Dated 01/08/2014 and 06/02/2012  FINDINGS: Normal conus tip at L1-2.  Normal paraspinal soft tissues.  T11-12 through L3-4: No significant abnormality and no significant change. Minimal hypertrophy of the ligamentum flavum at L2-3 and L3-4.  L4-5: Previous right laminectomy. Hypertrophy of the left ligamentum flavum. There is either scarring or a small chronic disc protrusion central and to the left disc which appears essentially unchanged since the prior study. Neural foramina are widely patent.  L5-S1:  Normal.  IMPRESSION: 1. Postsurgical changes at L4-5 with what is most likely either scarring or a small chronic disc protrusion central and to the left, unchanged. The right nerve roots are not impinged upon. There could possibly be some neural impingement of the left L5 nerve. 2. No other significant abnormality.   Electronically Signed   By: Lorriane Shire M.D.  On: 05/27/2015 08:58    Knee-L MR w contrast:  Results for orders placed during the hospital encounter of 05/20/19  MR KNEE LEFT WO CONTRAST   Narrative CLINICAL DATA:  Peripatellar pain.  No specific injury.  EXAM: MRI OF THE LEFT KNEE WITHOUT CONTRAST  TECHNIQUE: Multiplanar, multisequence MR imaging of the knee was performed. No intravenous contrast was administered.  COMPARISON:  None.  FINDINGS: MENISCI  Medial  meniscus: Intrameniscal degenerative type changes but no discrete meniscal tear.  Lateral meniscus:  Intact  LIGAMENTS  Cruciates:  Intact  Collaterals:  Intact  CARTILAGE  Patellofemoral: Moderate degenerative chondrosis with cartilage thinning but no full-thickness cartilage defect or osteochondral lesion. The lateral facet is slightly elongated and flat and the trochlear region is shallow. The TT-TG distance is within normal limits at 7 mm.  Medial:  Mild degenerative chondrosis.  Lateral:  Mild degenerative chondrosis.  Joint:  No joint effusion.  Popliteal Fossa:  Small Baker's cyst.  Extensor Mechanism: The patella retinacular structures are intact and the quadriceps and patellar tendons are intact.  Bones: Patchy metaphyseal marrow signal not atypical and younger females with this body habitus. No fractures, bone contusions or lesions.  Other: Normal knee musculature.  IMPRESSION: 1. Moderate patellofemoral degenerative chondrosis/chondromalacia. 2. Intact ligamentous structures and no acute bony findings. 3. No meniscal tears. Intrasubstance degenerative type signal changes involving the medial meniscus. 4. No joint effusion.  Small Baker's cyst.   Electronically Signed   By: Marijo Sanes M.D.   On: 05/20/2019 20:31      Postoperative changes from right L4-L5 hemilaminotomy. At L4-L5 there is a new central disc extrusion with moderate spinal canal narrowing and severe bilateral subarticular narrowing contributing to displacement of the traversing bilateral L5 nerve roots. There is also mild bilateral neural foraminal narrowing.  Mild right neural foraminal narrowing at L3-L4.  Result Narrative  EXAM: Magnetic resonance imaging, lumbar spine without and with contrast. DATE: 05/20/2018 8:45 AM ACCESSION: [id number] UN DICTATED: 05/20/2018 8:54 AM INTERPRETATION LOCATION: [facility]  CLINICAL INDICATION: 49 years old Female with LUMBAR RADICULOPATHY  - M96.1 - Post laminectomy syndrome   COMPARISON: MRI lumbar spine 05/27/2015.  TECHNIQUE: Multiplanar MRI was performed through the lumbar spine prior to and following intravenous contrast administration.  FINDINGS: Dextrocurvature of the spine centered at L2-L3. Bone marrow signal intensity is normal. Disc height loss and desiccation at L4-L5. The visualized cord is unremarkable and the conus medullaris ends at a normal level.   L3-L4: No herniation or spinal canal narrowing. Facet arthropathy. There is mild right neural foraminal narrowing.  L4-L5: Postoperative changes of right L4-L5 hemilaminotomy. Minimal grade 1 retrolisthesis. There is a new central disc extrusion with cranial migration contributing to moderate spinal canal narrowing and severe bilateral subarticular narrowing. Additional enhancing soft tissue is noted inferior to the disc extrusion, extending into the right subarticular recess, compatible with scar tissue. Facet arthropathy. Mild bilateral neural foraminal narrowing.  Otherwise there is no significant spinal canal or neural foraminal narrowing.  For the purposes of this dictation, the lowest well formed intervertebral disc space is assumed to be the L5-S1 level, and there are presumed to be five lumbar-type vertebral bodies.     Complexity Note: Imaging results reviewed. Results shared with Ms. [name], using Layman's terms.                         [place]  Drug: Ms. Schubach  reports no history of drug use. Alcohol:  reports current alcohol use. Tobacco:  reports that she has been smoking. She has a 13.00 pack-year smoking history. She has never used smokeless tobacco. Medical:  has a past medical history of Arthritis, Chronic neck and back pain, Degenerative disc disease, cervical, Degenerative disc disease, lumbar, Hiatal hernia (04/26/2015), History of bronchitis (04/26/2015), History of hematuria (04/26/2015), History of nephrolithiasis (04/26/2015), History of  recurrent UTI (urinary tract infection) (04/26/2015), Interstitial cystitis, and Status post hysterectomy (04/26/2015). Family: family history includes Diabetes in her mother; Heart disease in her father; Hyperlipidemia in her mother; Hypertension in her mother.  Past Surgical History:  Procedure Laterality Date  . BACK SURGERY    . BLADDER SURGERY    . ESOPHAGOGASTRODUODENOSCOPY (EGD) WITH PROPOFOL N/A 01/15/2019   Procedure: ESOPHAGOGASTRODUODENOSCOPY (EGD) WITH PROPOFOL;  Surgeon: Lin Landsman, MD;  Location: [facility];  Service: Gastroenterology;  Laterality: N/A;  . VAGINAL HYSTERECTOMY     Active Ambulatory Problems    Diagnosis Date Noted  . Chronic pain 04/25/2015  . Long term current use of opiate analgesic 04/25/2015  . Long term prescription opiate use 04/25/2015  . Opiate use (90 MME/Day) 04/25/2015  . Opiate dependence ([place]) 04/25/2015  . Encounter for therapeutic drug level monitoring 04/25/2015  . Current tobacco use 01/28/2015  . Chronic low back pain (Location of Primary Source of Pain) (Bilateral) (L>R) 04/25/2015  . Lumbar spondylosis 04/25/2015  . Chronic radicular Cervical pain (Bilateral C6 Dermatomal distribution) 04/25/2015  . Cervical spondylosis 04/25/2015  . Chronic radicular Lumbar pain (Location of Tertiary source of pain) (Right S1 Dermatomal pain) 04/25/2015  . Allergy to contrast media (used for diagnostic x-rays) 04/26/2015  . Failed back surgical syndrome (2002 by Dr. March Rummage) 04/26/2015  . Epidural fibrosis 04/26/2015  . Musculoskeletal pain 04/26/2015  . Neurogenic pain 04/26/2015  . Neuropathic pain 04/26/2015  . Bulging lumbar disc (L3-4, L4-5, and L5-S1) 04/26/2015  . Lumbar radiculopathy 04/26/2015  . Chronic pelvic pain in female 04/26/2015  . History of endometriosis 04/26/2015  . Sacrococcygeal pain (tailbone pain/sacrococcygeal neuralgia) 04/26/2015  . Coccygodynia 04/26/2015  . Herniated cervical disc (C4-5 and C5-6) 04/26/2015   . Tobacco abuse 04/26/2015  . Chronic obstructive pulmonary disease (COPD) ([place]) 04/26/2015  . Urinary incontinence 04/26/2015  . History of bronchitis 04/26/2015  . Hiatal hernia 04/26/2015  . Pain due to interstitial cystitis 04/26/2015  . History of recurrent UTI (urinary tract infection) 04/26/2015  . History of nephrolithiasis 04/26/2015  . History of hematuria 04/26/2015  . Osteoarthrosis 04/26/2015  . Status post hysterectomy 04/26/2015  . Lumbar facet syndrome (Bilateral) (R>L) 07/25/2015  . Chronic neck pain (Location of Secondary source of pain) (Bilateral) (L>R) 07/25/2015  . Dyspepsia   . Dysphagia   . RUQ pain   . Bilateral primary osteoarthritis of knee 08/06/2019   Resolved Ambulatory Problems    Diagnosis Date Noted  . No Resolved Ambulatory Problems   Past Medical History:  Diagnosis Date  . Arthritis   . Chronic neck and back pain   . Degenerative disc disease, cervical   . Degenerative disc disease, lumbar   . Interstitial cystitis    Assessment  Primary Diagnosis & Pertinent Problem List: The primary encounter diagnosis was Lumbar radiculopathy. Diagnoses of Chronic radicular lumbar pain, Lumbar post-laminectomy syndrome, Lumbar disc herniation with radiculopathy (large L4-5 disc herniated nucleus pulposus with central and right paracentral extrusion), Cervical spondylosis, Failed back surgical syndrome (2002 by Dr. March Rummage), Neuropathic pain, Bilateral primary osteoarthritis of knee, and Chronic pain syndrome were also pertinent  to this visit.  Visit Diagnosis (New problems to examiner): 1. Lumbar radiculopathy   2. Chronic radicular lumbar pain   3. Lumbar post-laminectomy syndrome   4. Lumbar disc herniation with radiculopathy (large L4-5 disc herniated nucleus pulposus with central and right paracentral extrusion)   5. Cervical spondylosis   6. Failed back surgical syndrome (2002 by Dr. March Rummage)   7. Neuropathic pain   8. Bilateral primary  osteoarthritis of knee   9. Chronic pain syndrome    Plan of Care (Initial workup plan)   49 year old female with a history of postlaminectomy pain syndrome, failed back surgical syndrome.  History of right L4-L5 hemilaminotomy at the age of 30 for L4-L5 herniated disc.  Now patient is having a new L4-L5 central disc extrusion with moderate canal narrowing and severe bilateral subarticular narrowing contributing to displacement of transversing bilateral L5 nerve roots which is severe.  Patient states that she was unable to perform PT.  She denies having tried any steroid injections in her lumbar region.  She was previously seen at the [facility] pain management clinic and they recommended a spinal cord stimulator trial.  We also had an extensive discussion about neuromodulation and spinal cord stimulator trials in patients that have postlaminectomy pain syndrome.  She will think about this further but I do believe the patient would be a suitable candidate for spinal cord stimulation.  Patient also endorses left knee pain.  Recent left knee MRI showed patellofemoral arthrosis.  Patient denies having any injections in her left knee.  Offered left knee intra-articular steroid injection on the same day of her epidural steroid injection.  Risks and benefits reviewed and patient would like to proceed.  We also briefly discussed alternative interventional therapies which could include Hyalgan and/or genicular nerve block and possible radiofrequency ablation/neurotomy.   Procedure Orders     Lumbar Epidural Injection     KNEE INJECTION  Pharmacological management options:  Opioid Analgesics: Avoid.  Focus on nonopioid analgesics  Membrane stabilizer: Continue gabapentin and Cymbalta.  Can consider Lyrica in future is also on amitriptyline.  I am not managing these medications at this time.  Muscle relaxant: To be determined at a later time  NSAID: To be determined at a later time  Other analgesic(s): To be  determined at a later time   Interventional management options:  The decision will be based on the results of diagnostic studies, as well as Ms. Hohensee's risk profile.  Procedure(s) under consideration:  History of L4-L5 laminectomy on right now dealing with postlaminectomy pain syndrome and chronic right radicular pain.  Discussed L5-S1 epidural steroid injection.  Has not had an epidural steroid injection since her lumbar spine surgery at the age of 23.  Future considerations could include caudal epidural.  We also had an extensive discussion about spinal cord stimulator trial for failed back surgical syndrome and postlaminectomy pain syndrome.  Patient will consider further.  Left knee pain: Left knee intra-articular steroid injection, intra-articular Hyalgan series, left knee genicular nerve block, possible radiofrequency ablation   Provider-requested follow-up: Return in about 2 weeks (around 08/20/2019) for L5/S1 ESI + L knee steroid (45 mins) , with sedation.  No future appointments. Total duration of encounter: 45 minutes.  Primary Care Physician: Brenda Patch, MD Note by: Brenda Santa, MD Date: 08/06/2019; Time: 10:23 AM

## 2019-08-06 ENCOUNTER — Encounter: Payer: Self-pay | Admitting: Student in an Organized Health Care Education/Training Program

## 2019-08-06 ENCOUNTER — Ambulatory Visit
Payer: Medicare Other | Attending: Student in an Organized Health Care Education/Training Program | Admitting: Student in an Organized Health Care Education/Training Program

## 2019-08-06 ENCOUNTER — Other Ambulatory Visit: Payer: Self-pay

## 2019-08-06 VITALS — Ht 62.0 in | Wt 165.0 lb

## 2019-08-06 DIAGNOSIS — M792 Neuralgia and neuritis, unspecified: Secondary | ICD-10-CM | POA: Diagnosis not present

## 2019-08-06 DIAGNOSIS — M961 Postlaminectomy syndrome, not elsewhere classified: Secondary | ICD-10-CM

## 2019-08-06 DIAGNOSIS — M170 Bilateral primary osteoarthritis of knee: Secondary | ICD-10-CM | POA: Diagnosis not present

## 2019-08-06 DIAGNOSIS — M47812 Spondylosis without myelopathy or radiculopathy, cervical region: Secondary | ICD-10-CM

## 2019-08-06 DIAGNOSIS — M5416 Radiculopathy, lumbar region: Secondary | ICD-10-CM

## 2019-08-06 DIAGNOSIS — G894 Chronic pain syndrome: Secondary | ICD-10-CM

## 2019-08-06 DIAGNOSIS — M5116 Intervertebral disc disorders with radiculopathy, lumbar region: Secondary | ICD-10-CM

## 2019-08-06 DIAGNOSIS — G8929 Other chronic pain: Secondary | ICD-10-CM

## 2019-08-19 ENCOUNTER — Other Ambulatory Visit: Payer: Self-pay

## 2019-08-19 ENCOUNTER — Encounter: Payer: Self-pay | Admitting: Student in an Organized Health Care Education/Training Program

## 2019-08-19 ENCOUNTER — Ambulatory Visit (HOSPITAL_BASED_OUTPATIENT_CLINIC_OR_DEPARTMENT_OTHER): Payer: Medicare Other | Admitting: Student in an Organized Health Care Education/Training Program

## 2019-08-19 ENCOUNTER — Ambulatory Visit
Admission: RE | Admit: 2019-08-19 | Discharge: 2019-08-19 | Disposition: A | Discharge status: Home / Self Care | Payer: Medicare Other | Source: Ambulatory Visit | Attending: Student in an Organized Health Care Education/Training Program | Admitting: Student in an Organized Health Care Education/Training Program

## 2019-08-19 VITALS — BP 118/75 | HR 130 | Temp 99.9°F | Resp 17 | Ht 62.0 in | Wt 165.0 lb

## 2019-08-19 DIAGNOSIS — M5416 Radiculopathy, lumbar region: Secondary | ICD-10-CM | POA: Diagnosis not present

## 2019-08-19 DIAGNOSIS — M170 Bilateral primary osteoarthritis of knee: Secondary | ICD-10-CM

## 2019-08-19 MED ORDER — DEXAMETHASONE SODIUM PHOSPHATE 10 MG/ML IJ SOLN
10.0000 mg | Freq: Once | INTRAMUSCULAR | Status: AC
  Administered 2019-08-19: 10 mg

## 2019-08-19 MED ORDER — FENTANYL CITRATE (PF) 100 MCG/2ML IJ SOLN
25.0000 ug | INTRAMUSCULAR | Status: DC | PRN
  Administered 2019-08-19: 100 ug via INTRAVENOUS

## 2019-08-19 MED ORDER — METHYLPREDNISOLONE 4 MG PO TBPK: ORAL_TABLET | ORAL | 0 refills | Status: AC

## 2019-08-19 MED ORDER — DEXAMETHASONE SODIUM PHOSPHATE 10 MG/ML IJ SOLN
INTRAMUSCULAR | Status: AC
  Filled 2019-08-19: qty 1

## 2019-08-19 MED ORDER — LIDOCAINE HCL 2 % IJ SOLN
20.0000 mL | Freq: Once | INTRAMUSCULAR | Status: AC
  Administered 2019-08-19: 400 mg

## 2019-08-19 MED ORDER — LIDOCAINE HCL (PF) 1 % IJ SOLN
INTRAMUSCULAR | Status: AC
  Filled 2019-08-19: qty 5

## 2019-08-19 MED ORDER — FENTANYL CITRATE (PF) 100 MCG/2ML IJ SOLN
INTRAMUSCULAR | Status: AC
  Filled 2019-08-19: qty 2

## 2019-08-19 MED ORDER — LIDOCAINE HCL (PF) 1 % IJ SOLN
5.0000 mL | Freq: Once | INTRAMUSCULAR | Status: AC
  Administered 2019-08-19: 5 mL

## 2019-08-19 MED ORDER — METHYLPREDNISOLONE ACETATE 80 MG/ML IJ SUSP
80.0000 mg | Freq: Once | INTRAMUSCULAR | Status: AC
  Administered 2019-08-19: 80 mg via INTRA_ARTICULAR

## 2019-08-19 MED ORDER — IOHEXOL 180 MG/ML  SOLN: 10.0000 mL | Freq: Once | INTRAMUSCULAR | Status: DC

## 2019-08-19 MED ORDER — SODIUM CHLORIDE 0.9% FLUSH
2.0000 mL | Freq: Once | INTRAVENOUS | Status: AC
  Administered 2019-08-19: 2 mL

## 2019-08-19 MED ORDER — IOHEXOL 180 MG/ML  SOLN
INTRAMUSCULAR | Status: AC
  Filled 2019-08-19: qty 20

## 2019-08-19 MED ORDER — SODIUM CHLORIDE (PF) 0.9 % IJ SOLN
INTRAMUSCULAR | Status: AC
  Filled 2019-08-19: qty 10

## 2019-08-19 MED ORDER — LIDOCAINE HCL 2 % IJ SOLN
INTRAMUSCULAR | Status: AC
  Filled 2019-08-19: qty 20

## 2019-08-19 MED ORDER — METHYLPREDNISOLONE ACETATE 80 MG/ML IJ SUSP
INTRAMUSCULAR | Status: AC
  Filled 2019-08-19: qty 1

## 2019-08-19 MED ORDER — ROPIVACAINE HCL 2 MG/ML IJ SOLN
INTRAMUSCULAR | Status: AC
  Filled 2019-08-19: qty 10

## 2019-08-19 MED ORDER — ROPIVACAINE HCL 2 MG/ML IJ SOLN: 2.0000 mL | Freq: Once | INTRAMUSCULAR | Status: DC

## 2019-08-19 NOTE — Patient Instructions (Signed)

## 2019-08-19 NOTE — Progress Notes (Signed)
PROVIDER NOTE: Information contained herein reflects review and annotations entered in association with encounter. Interpretation of such information and data should be left to medically-trained personnel. Information provided to patient can be located elsewhere in the medical record under "Patient Instructions". Document created using STT-dictation technology, any transcriptional errors that may result from process are unintentional.    Patient: Brenda Berry  Service Category: Procedure  Provider: Edward Jolly, MD  DOB: 1970/12/16  DOS: 08/19/2019  Location: [facility]  MRN: [id number]  Setting: Ambulatory - outpatient  Referring Provider: Duanne Limerick, MD  Type: Established Patient  Specialty: Interventional Pain Management  PCP: Duanne Limerick, MD   Primary Reason for Visit: Interventional Pain Management Treatment. CC: Back Pain and Knee Pain (left)  Procedure:          Anesthesia, Analgesia, Anxiolysis:  Type: Diagnostic Intra-Articular Local anesthetic and steroid Knee Injection #1  Region: Medial infrapatellar Knee Region Level: Knee Joint Laterality: Left knee  Type: Local Anesthesia Indication(s): Analgesia         Local Anesthetic: Lidocaine 1-2% Route: Infiltration (/IM) IV Access: Declined Sedation: Declined   Position: Sitting   Indications: 1. Bilateral primary osteoarthritis of knee   2. Lumbar radiculopathy    Pain Score: Pre-procedure: 6 /10 Post-procedure: 8 /10   Pre-op Assessment:  [name] is a 49 y.o. (year old), female patient, seen today for interventional treatment. She  has a past surgical history that includes Back surgery; Vaginal hysterectomy; Bladder surgery; and Esophagogastroduodenoscopy (egd) with propofol (N/A, 01/15/2019). [name] has a current medication list which includes the following prescription(s): acetaminophen, albuterol, amitriptyline, clotrimazole-betamethasone, cyclobenzaprine, duloxetine, omeprazole, oxybutynin,  duloxetine, gabapentin, gabapentin, gabapentin, hydroxyzine, methylprednisolone, oxycodone hcl, pantoprazole, pentosan polysulfate, and tizanidine, and the following Facility-Administered Medications: fentanyl, iohexol, and ropivacaine (pf) 2 mg/ml (0.2%). Her primarily concern today is the Back Pain and Knee Pain (left)  Initial Vital Signs:  Pulse/HCG Rate: (!) 130ECG Heart Rate: (!) 127 Temp: 99.9 F (37.7 C) Resp: 18 BP: (!) 139/97 SpO2: 99 %  BMI: Estimated body mass index is 30.18 kg/m as calculated from the following:   Height as of this encounter: 5\' 2"  (1.575 m).   Weight as of this encounter: 165 lb (74.8 kg).  Risk Assessment: Allergies: Reviewed. She is allergic to elmiron [pentosan polysulfate]; ciprofloxacin; demerol [meperidine]; ibuprofen; iodinated diagnostic agents; latex; lodine [etodolac]; nifedipine; other; sulfa antibiotics; and terbutaline.  Allergy Precautions: None required Coagulopathies: Reviewed. None identified.  Blood-thinner therapy: None at this time Active Infection(s): Reviewed. None identified. [name] is afebrile  Site Confirmation: [name] was asked to confirm the procedure and laterality before marking the site Procedure checklist: Completed Consent: Before the procedure and under the influence of no sedative(s), amnesic(s), or anxiolytics, the patient was informed of the treatment options, risks and possible complications. To fulfill our ethical and legal obligations, as recommended by the American Medical Association's Code of Ethics, I have informed the patient of my clinical impression; the nature and purpose of the treatment or procedure; the risks, benefits, and possible complications of the intervention; the alternatives, including doing nothing; the risk(s) and benefit(s) of the alternative treatment(s) or procedure(s); and the risk(s) and benefit(s) of doing nothing. The patient was provided information about the general risks and possible  complications associated with the procedure. These may include, but are not limited to: failure to achieve desired goals, infection, bleeding, organ or nerve damage, allergic reactions, paralysis, and death. In addition, the patient was informed of  those risks and complications associated to the procedure, such as failure to decrease pain; infection; bleeding; organ or nerve damage with subsequent damage to sensory, motor, and/or autonomic systems, resulting in permanent pain, numbness, and/or weakness of one or several areas of the body; allergic reactions; (i.e.: anaphylactic reaction); and/or death. Furthermore, the patient was informed of those risks and complications associated with the medications. These include, but are not limited to: allergic reactions (i.e.: anaphylactic or anaphylactoid reaction(s)); adrenal axis suppression; blood sugar elevation that in diabetics may result in ketoacidosis or comma; water retention that in patients with history of congestive heart failure may result in shortness of breath, pulmonary edema, and decompensation with resultant heart failure; weight gain; swelling or edema; medication-induced neural toxicity; particulate matter embolism and blood vessel occlusion with resultant organ, and/or nervous system infarction; and/or aseptic necrosis of one or more joints. Finally, the patient was informed that Medicine is not an exact science; therefore, there is also the possibility of unforeseen or unpredictable risks and/or possible complications that may result in a catastrophic outcome. The patient indicated having understood very clearly. We have given the patient no guarantees and we have made no promises. Enough time was given to the patient to ask questions, all of which were answered to the patient's satisfaction. Ms. Berry has indicated that she wanted to continue with the procedure. Attestation: I, the ordering provider, attest that I have discussed with the patient  the benefits, risks, side-effects, alternatives, likelihood of achieving goals, and potential problems during recovery for the procedure that I have provided informed consent. Date  Time: 08/19/2019  9:54 AM  Pre-Procedure Preparation:  Monitoring: As per clinic protocol. Respiration, ETCO2, SpO2, BP, heart rate and rhythm monitor placed and checked for adequate function Safety Precautions: Patient was assessed for positional comfort and pressure points before starting the procedure. Time-out: I initiated and conducted the "Time-out" before starting the procedure, as per protocol. The patient was asked to participate by confirming the accuracy of the "Time Out" information. Verification of the correct person, site, and procedure were performed and confirmed by me, the nursing staff, and the patient. "Time-out" conducted as per Joint Commission's Universal Protocol (UP.01.01.01). Time: 1054  Description of Procedure:          Target Area: Knee Joint Approach: Just above the Medial tibial plateau, lateral to the infrapatellar tendon. Area Prepped: Entire knee area, from the mid-thigh to the mid-shin. Prepping solution: DuraPrep (Iodine Povacrylex [0.7% available iodine] and Isopropyl Alcohol, 74% w/w) Safety Precautions: Aspiration looking for blood return was conducted prior to all injections. At no point did we inject any substances, as a needle was being advanced. No attempts were made at seeking any paresthesias. Safe injection practices and needle disposal techniques used. Medications properly checked for expiration dates. SDV (single dose vial) medications used. Description of the Procedure: Protocol guidelines were followed. The patient was placed in position over the fluoroscopy table. The target area was identified and the area prepped in the usual manner. Skin & deeper tissues infiltrated with local anesthetic. Appropriate amount of time allowed to pass for local anesthetics to take effect.  The procedure needles were then advanced to the target area. Proper needle placement secured. Negative aspiration confirmed. Solution injected in intermittent fashion, asking for systemic symptoms every 0.5cc of injectate. The needles were then removed and the area cleansed, making sure to leave some of the prepping solution back to take advantage of its long term bactericidal properties. Vitals:   08/19/19 1047  08/19/19 1052 08/19/19 1100 08/19/19 1110  BP: (!) 129/91 94/70 115/70 (!) 145/85  Pulse:      Resp: 19 18 16 16   Temp:      SpO2: 100% 100% 100% 95%  Weight:      Height:        Start Time: 1054 hrs. End Time: 1100 hrs. Materials:  Needle(s) Type: Regular needle Gauge: 25G Length: 1.5-in Medication(s): Please see orders for medications and dosing details. 4 cc solution made of 3.5 cc of 1% lidocaine and 0.5 cc of methylprednisolone 80 mg/cc. 40 mg of methylprednisolone injected Imaging Guidance:          Type of Imaging Technique: None used Indication(s): N/A Exposure Time: No patient exposure Contrast: None used. Fluoroscopic Guidance: N/A Ultrasound Guidance: N/A Interpretation: N/A  Antibiotic Prophylaxis:   Anti-infectives (From admission, onward)   None     Indication(s): None identified  Post-operative Assessment:  Post-procedure Vital Signs:  Pulse/HCG Rate: (!) 130(!) 111 Temp: 99.9 F (37.7 C) Resp: 16 BP: (!) 145/85 SpO2: 95 %  EBL: None  Complications: No immediate post-treatment complications observed by team, or reported by patient.  Note: The patient tolerated the entire procedure well. A repeat set of vitals were taken after the procedure and the patient was kept under observation following institutional policy, for this type of procedure. Post-procedural neurological assessment was performed, showing return to baseline, prior to discharge. The patient was provided with post-procedure discharge instructions, including a section on how to  identify potential problems. Should any problems arise concerning this procedure, the patient was given instructions to immediately contact , at any time, without hesitation. In any case, we plan to contact the patient by telephone for a follow-up status report regarding this interventional procedure.  Comments:  No additional relevant information.  Of note, we also attempted to perform lumbar epidural steroid injection for the patient's lumbar radicular pain.  Patient had significant discomfort with skin injection, could not tolerate L5-S1 injection and could not tolerate caudal ESI either.  Patient wanted to abort procedure.  Will call in Medrol Dosepak for lumbar radicular pain flare.  Plan of Care  Orders:  Orders Placed This Encounter  Procedures  . DG PAIN CLIN[facility] C-ARM 1-60 MIN NO REPORT    Intraoperative interpretation by procedural physician at [facility].    Standing Status:   Standing    Number of Occurrences:   1    Order Specific Question:   Reason for exam:    Answer:   Assistance in needle guidance and placement for procedures requiring needle placement in or near specific anatomical locations not easily accessible without such assistance.    Medications ordered for procedure: Meds ordered this encounter  Medications  . iohexol (OMNIPAQUE) 180 MG/ML injection 10 mL    Must be Myelogram-compatible. If not available, you may substitute with a water-soluble, non-ionic, hypoallergenic, myelogram-compatible radiological contrast medium.  [facility] lidocaine (XYLOCAINE) 2 % (with pres) injection 400 mg  . fentaNYL (SUBLIMAZE) injection 25-50 mcg    Make sure Narcan is available in the pyxis when using this medication. In the event of respiratory depression (RR< 8/min): Titrate NARCAN (naloxone) in increments of 0.1 to 0.2 mg IV at 2-3 minute intervals, until desired degree of reversal.  . ropivacaine (PF) 2 mg/mL (0.2%) (NAROPIN) injection 2 mL  . sodium chloride flush (NS)  0.9 % injection 2 mL  . dexamethasone (DECADRON) injection 10 mg  . lidocaine (PF) (XYLOCAINE) 1 % injection 5  mL  . methylPREDNISolone acetate (DEPO-MEDROL) injection 80 mg  . methylPREDNISolone (MEDROL) 4 MG TBPK tablet    Sig: Follow package instructions.    Dispense:  21 tablet    Refill:  0    Do not add to the "Automatic Refill" notification system.   Medications administered: We administered lidocaine, fentaNYL, sodium chloride flush, dexamethasone, lidocaine (PF), and methylPREDNISolone acetate.  See the medical record for exact dosing, route, and time of administration.  Follow-up plan:   Return in about 4 weeks (around 09/16/2019) for Post Procedure Evaluation, virtual (r k.     Recent Visits Date Type Provider Dept  08/06/19 Office Visit Edward Jolly, MD [facility]  Showing recent visits within past 90 days and meeting all other requirements   Today's Visits Date Type Provider Dept  08/19/19 Procedure visit Edward Jolly, MD [facility]  Showing today's visits and meeting all other requirements   Future Appointments Date Type Provider Dept  09/17/19 Appointment Edward Jolly, MD [facility]  Showing future appointments within next 90 days and meeting all other requirements   Disposition: Discharge home  Discharge (Date  Time): 08/19/2019;   hrs.   Primary Care Physician: Duanne Limerick, MD Location: [facility] Note by: Edward Jolly, MD Date: 08/19/2019; Time: 11:26 AM  Disclaimer:  Medicine is not an exact science. The only guarantee in medicine is that nothing is guaranteed. It is important to note that the decision to proceed with this intervention was based on the information collected from the patient. The Data and conclusions were drawn from the patient's questionnaire, the interview, and the physical examination. Because the information was provided in large part by the patient, it cannot be  guaranteed that it has not been purposely or unconsciously manipulated. Every effort has been made to obtain as much relevant data as possible for this evaluation. It is important to note that the conclusions that lead to this procedure are derived in large part from the available data. Always take into account that the treatment will also be dependent on availability of resources and existing treatment guidelines, considered by other Pain Management Practitioners as being common knowledge and practice, at the time of the intervention. For Medico-Legal purposes, it is also important to point out that variation in procedural techniques and pharmacological choices are the acceptable norm. The indications, contraindications, technique, and results of the above procedure should only be interpreted and judged by a Board-Certified Interventional Pain Specialist with extensive familiarity and expertise in the same exact procedure and technique.

## 2019-08-19 NOTE — Progress Notes (Signed)
Instructed patient to pick up Steroid pack of pharm.

## 2019-08-19 NOTE — Progress Notes (Addendum)
Safety precautions to be maintained throughout the outpatient stay will include: orient to surroundings, keep bed in low position, maintain call bell within reach at all times, provide assistance with transfer out of bed and ambulation. Temp 99.9 x 3  Oral and temporal.  Dr Cherylann Ratel notified of temp and HR 126.

## 2019-08-20 ENCOUNTER — Telehealth: Payer: Self-pay | Admitting: *Deleted

## 2019-08-20 NOTE — Telephone Encounter (Signed)
Denies any post procedure issues. She did fill her RX for steroid taper and has started it.

## 2019-08-26 ENCOUNTER — Telehealth: Payer: Self-pay | Admitting: *Deleted

## 2019-08-26 NOTE — Telephone Encounter (Signed)
Will you do this? 

## 2019-08-27 ENCOUNTER — Other Ambulatory Visit: Payer: Self-pay

## 2019-08-27 ENCOUNTER — Telehealth: Payer: Self-pay | Admitting: Student in an Organized Health Care Education/Training Program

## 2019-08-27 ENCOUNTER — Telehealth: Payer: Self-pay

## 2019-08-27 DIAGNOSIS — B370 Candidal stomatitis: Secondary | ICD-10-CM

## 2019-08-27 MED ORDER — NYSTATIN 100000 UNIT/ML MT SUSP: 5.0000 mL | Freq: Three times a day (TID) | OROMUCOSAL | 0 refills | Status: DC

## 2019-08-27 NOTE — Telephone Encounter (Signed)
Pt called in stating that Dr Cherylann Ratel had put her on steroids which caused oral mouth pain/ thrush. I spoke with Leane Platt RN, with Dr [place] office and she stated Dr Cherylann Ratel was out of office and there was not anyone to cover Lateef. I explained that we normally don't call in a med that is covering something that was caused by a medication we didn't prescribe. She said the "other doctor" would probably not be willing to prescribe in the place of Dr Cherylann Ratel. We will call this in this time so the pt will not be in pain over the weekend as Jones and I will be out of office tomorrow.

## 2019-08-27 NOTE — Telephone Encounter (Signed)
Patient lvmail stating she has thrush in her mouth and wants Dr. Cherylann Ratel to call in the mouthwash that clears it up. Please call to advise asap

## 2019-08-27 NOTE — Telephone Encounter (Signed)
Called patient and she stated that she has developed thrush in her mouth from the steroid that was given last week. Patient was wanting a Rx sent for mouthwash to help with this. I told patient that Dr Cherylann Ratel was on Vacation and to see if her PCP would write this for her.

## 2019-08-27 NOTE — Progress Notes (Signed)
Nystatin sent in to [facility]

## 2019-09-09 ENCOUNTER — Ambulatory Visit: Payer: Medicare Other | Admitting: Student in an Organized Health Care Education/Training Program

## 2019-09-16 ENCOUNTER — Telehealth: Payer: Self-pay | Admitting: *Deleted

## 2019-09-16 ENCOUNTER — Ambulatory Visit (INDEPENDENT_AMBULATORY_CARE_PROVIDER_SITE_OTHER): Payer: Medicare Other

## 2019-09-16 VITALS — Ht 62.0 in | Wt 165.0 lb

## 2019-09-16 DIAGNOSIS — Z1231 Encounter for screening mammogram for malignant neoplasm of breast: Secondary | ICD-10-CM

## 2019-09-16 DIAGNOSIS — Z789 Other specified health status: Secondary | ICD-10-CM

## 2019-09-16 DIAGNOSIS — Z0000 Encounter for general adult medical examination without abnormal findings: Secondary | ICD-10-CM | POA: Diagnosis not present

## 2019-09-16 NOTE — Telephone Encounter (Signed)
[id number] Called for pre visit info. No answer. LVM.

## 2019-09-16 NOTE — Progress Notes (Signed)
Subjective:   Brenda Berry is a 49 y.o. female who presents for an Initial Medicare Annual Wellness Visit.  Virtual Visit via Telephone Note  I connected with Brenda Berry on 09/16/19 at 11:20 AM EDT by telephone and verified that I am speaking with the correct person using two identifiers.  Medicare Annual Wellness visit completed telephonically due to Covid-19 pandemic.   Location: Patient: home Provider: office   I discussed the limitations, risks, security and privacy concerns of performing an evaluation and management service by telephone and the availability of in person appointments. The patient expressed understanding and agreed to proceed.  Some vital signs may be absent or patient reported.   Reather Littler, LPN    Review of Systems      Cardiac Risk Factors include: smoking/ tobacco exposure;obesity (BMI >30kg/m2)     Objective:    Today's Vitals   09/16/19 1123 09/16/19 1124  Weight: 165 lb (74.8 kg)   Height: 5\' 2"  (1.575 m)   PainSc:  6    Body mass index is 30.18 kg/m.  Advanced Directives 09/16/2019 08/19/2019 08/05/2019 01/15/2019 01/26/2016 10/24/2015 07/25/2015  Does Patient Have a Medical Advance Directive? No No No No No No No  Would patient like information on creating a medical advance directive? No - Patient declined No - Patient declined No - Patient declined No - Patient declined - - No - patient declined information    Current Medications (verified) Outpatient Encounter Medications as of 09/16/2019  Medication Sig  . acetaminophen (TYLENOL) 325 MG tablet Take 650 mg by mouth every 4 (four) hours as needed.   11/16/2019 albuterol (PROVENTIL HFA;VENTOLIN HFA) 108 (90 Base) MCG/ACT inhaler Inhale 2 puffs into the lungs every 6 (six) hours as needed for wheezing or shortness of breath.  . gabapentin (NEURONTIN) 300 MG capsule Take 2 capsules by mouth 4 (four) times daily.   Marland Kitchen omeprazole (PRILOSEC) 40 MG capsule TAKE ONE CAPSULE BY MOUTH DAILY BEFORE BREAKFAST    . oxybutynin (DITROPAN) 5 MG tablet Take 1 tablet by mouth as needed.  Marland Kitchen tiZANidine (ZANAFLEX) 4 MG tablet Take 1 tablet by mouth 3 (three) times daily.  Marland Kitchen amitriptyline (ELAVIL) 25 MG tablet Take 1 tablet (25 mg total) by mouth at bedtime.  . clotrimazole-betamethasone (LOTRISONE) cream Apply 1 application topically 2 (two) times daily.  . DULoxetine (CYMBALTA) 30 MG capsule Take 30 mg by mouth daily.  . DULoxetine (CYMBALTA) 60 MG capsule Take 60 mg by mouth daily.   Marland Kitchen nystatin (MYCOSTATIN) 100000 UNIT/ML suspension Take 5 mLs (500,000 Units total) by mouth 3 (three) times daily. Swish and swallow  . [DISCONTINUED] cyclobenzaprine (FLEXERIL) 10 MG tablet Take by mouth.  . [DISCONTINUED] gabapentin (NEURONTIN) 100 MG capsule TK ONE C PO TID  . [DISCONTINUED] gabapentin (NEURONTIN) 600 MG tablet Take 600 mg by mouth 4 (four) times daily.  . [DISCONTINUED] hydrOXYzine (ATARAX/VISTARIL) 25 MG tablet Take by mouth.  . [DISCONTINUED] Oxycodone HCl 10 MG TABS Take by mouth.  . [DISCONTINUED] pantoprazole (PROTONIX) 40 MG tablet Take 1 tablet (40 mg total) by mouth daily. (Patient not taking: Reported on 08/05/2019)  . [DISCONTINUED] pentosan polysulfate (ELMIRON) 100 MG capsule Take by mouth.   No facility-administered encounter medications on file as of 09/16/2019.    Allergies (verified) Elmiron [pentosan polysulfate], Ciprofloxacin, Demerol [meperidine], Ibuprofen, Iodinated diagnostic agents, Latex, Lodine [etodolac], Nifedipine, Other, Sulfa antibiotics, and Terbutaline   History: Past Medical History:  Diagnosis Date  . Arthritis   . Chronic  neck and back pain   . Degenerative disc disease, cervical   . Degenerative disc disease, lumbar   . GERD (gastroesophageal reflux disease)   . Hiatal hernia 04/26/2015  . History of bronchitis 04/26/2015  . History of hematuria 04/26/2015  . History of nephrolithiasis 04/26/2015  . History of recurrent UTI (urinary tract infection) 04/26/2015   . Interstitial cystitis   . Status post hysterectomy 04/26/2015   Past Surgical History:  Procedure Laterality Date  . BACK SURGERY    . BLADDER SURGERY    . ESOPHAGOGASTRODUODENOSCOPY (EGD) WITH PROPOFOL N/A 01/15/2019   Procedure: ESOPHAGOGASTRODUODENOSCOPY (EGD) WITH PROPOFOL;  Surgeon: Lin Landsman, MD;  Location: [facility];  Service: Gastroenterology;  Laterality: N/A;  . VAGINAL HYSTERECTOMY     Family History  Problem Relation Age of Onset  . Diabetes Mother   . Hyperlipidemia Mother   . Hypertension Mother   . Heart disease Father    Social History   Socioeconomic History  . Marital status: Divorced    Spouse name: Not on file  . Number of children: Not on file  . Years of education: Not on file  . Highest education level: Not on file  Occupational History  . Occupation: disabled  Tobacco Use  . Smoking status: Current Every Day Smoker    Packs/day: 0.50    Years: 26.00    Pack years: 13.00  . Smokeless tobacco: Never Used  Substance and Sexual Activity  . Alcohol use: Yes    Alcohol/week: 0.0 standard drinks    Comment: occ   . Drug use: No  . Sexual activity: Yes  Other Topics Concern  . Not on file  Social History Narrative  . Not on file   Social Determinants of Health   Financial Resource Strain: High Risk  . Difficulty of Paying Living Expenses: Hard  Food Insecurity: No Food Insecurity  . Worried About Charity fundraiser in the Last Year: Never true  . Ran Out of Food in the Last Year: Never true  Transportation Needs: No Transportation Needs  . Lack of Transportation (Medical): No  . Lack of Transportation (Non-Medical): No  Physical Activity: Insufficiently Active  . Days of Exercise per Week: 7 days  . Minutes of Exercise per Session: 20 min  Stress: No Stress Concern Present  . Feeling of Stress : Only a little  Social Connections: Unknown  . Frequency of Communication with Friends and Family: Patient refused  . Frequency  of Social Gatherings with Friends and Family: Patient refused  . Attends Religious Services: Patient refused  . Active Member of Clubs or Organizations: Patient refused  . Attends Archivist Meetings: Patient refused  . Marital Status: Divorced    Tobacco Counseling Ready to quit: Not Answered Counseling given: Not Answered   Clinical Intake:  Pre-visit preparation completed: Yes  Pain : 0-10 Pain Score: 6  Pain Type: Chronic pain Pain Location: Back Pain Orientation: Lower Pain Descriptors / Indicators: Aching, Sore Pain Onset: More than a month ago Pain Frequency: Constant     BMI - recorded: 30.18 Nutritional Status: BMI > 30  Obese Nutritional Risks: None Diabetes: No  How often do you need to have someone help you when you read instructions, pamphlets, or other written materials from your doctor or pharmacy?: 1 - Never  Interpreter Needed?: No  Information entered by :: Clemetine Marker LPN   Activities of Daily Living In your present state of health, do you have any difficulty  performing the following activities: 09/16/2019  Hearing? N  Comment declines hearing aids  Vision? N  Difficulty concentrating or making decisions? N  Walking or climbing stairs? N  Dressing or bathing? N  Doing errands, shopping? N  Preparing Food and eating ? N  Using the Toilet? N  In the past six months, have you accidently leaked urine? Y  Comment wears pads for protection  Do you have problems with loss of bowel control? N  Managing your Medications? N  Managing your Finances? N  Housekeeping or managing your Housekeeping? N  Some recent data might be hidden     Immunizations and Health Maintenance  There is no immunization history on file for this patient. Health Maintenance Due  Topic Date Due  . PAP SMEAR-Modifier  Never done    Patient Care Team: Duanne Limerick, MD as PCP - General (Family Medicine)  Indicate any recent Medical Services you may have  received from other than [facility] providers in the past year (date may be approximate).     Assessment:   This is a routine wellness examination for [place].  Hearing/Vision screen  Hearing Screening   125Hz  250Hz  500Hz  1000Hz  2000Hz  3000Hz  4000Hz  6000Hz  8000Hz   Right ear:           Left ear:           Comments: Pt denies hearing difficulty  Vision Screening Comments: Pt wears reading glasses, past due for eye exam.   Dietary issues and exercise activities discussed: Current Exercise Habits: Home exercise routine, Type of exercise: walking, Time (Minutes): 20, Frequency (Times/Week): 7, Weekly Exercise (Minutes/Week): 140, Intensity: Moderate, Exercise limited by: orthopedic condition(s)  Goals   None    Depression Screen PHQ 2/9 Scores 09/16/2019 08/19/2019 08/05/2019 01/08/2019 01/26/2016 10/24/2015 07/25/2015  PHQ - 2 Score 0 0 0 0 0 0 0  PHQ- 9 Score - - - 0 - - -    Fall Risk Fall Risk  09/16/2019 08/19/2019 08/05/2019 01/26/2016 10/24/2015  Falls in the past year? 0 0 0 Yes No  Number falls in past yr: 0 - - 1 -  Comment - - - approx 1 1/2 m;onth ago, left leg had a sharp pain and gave away and she felll to the ground.  -  Injury with Fall? 0 - - No -  Risk for fall due to : No Fall Risks - - - -  Follow up Falls prevention discussed - - - -    FALL RISK PREVENTION PERTAINING TO THE HOME:  Any stairs in or around the home? Yes  If so, do they handrails? Yes   Home free of loose throw rugs in walkways, pet beds, electrical cords, etc? Yes  Adequate lighting in your home to reduce risk of falls? Yes   ASSISTIVE DEVICES UTILIZED TO PREVENT FALLS:  Life alert? No  Use of a cane, walker or w/c? No  Grab bars in the bathroom? Yes  Shower chair or bench in shower? No  Elevated toilet seat or a handicapped toilet? No   DME ORDERS:  DME order needed?  No   TIMED UP AND GO:  Was the test performed? No . Telephonic visit.   Education: Fall risk prevention has been discussed.    Intervention(s) required? No    Cognitive Function: 6CIT deferred for 2021 AWV, pt has no memory issues.         Screening Tests Health Maintenance  Topic Date Due  . PAP SMEAR-Modifier  Never done  . TETANUS/TDAP  01/08/2020 (Originally 10/27/1991)  . INFLUENZA VACCINE  01/10/2020  . HIV Screening  Completed    Qualifies for Shingles Vaccine? No   Due at age 62.  Tdap: Although this vaccine is not a covered service during a Wellness Exam, does the patient still wish to receive this vaccine today?  No .  Education has been provided regarding the importance of this vaccine. Advised may receive this vaccine at local pharmacy or Health Dept. Aware to provide a copy of the vaccination record if obtained from local pharmacy or Health Dept. Verbalized acceptance and understanding.  Flu Vaccine: Due for Flu vaccine. Does the patient want to receive this vaccine today?  No . Education has been provided regarding the importance of this vaccine but still declined. Advised may receive this vaccine at local pharmacy or Health Dept. Aware to provide a copy of the vaccination record if obtained from local pharmacy or Health Dept. Verbalized acceptance and understanding.  Pneumococcal Vaccine: Due for Pneumococcal vaccine at age 45.  Cancer Screenings:  Colorectal Screening:  Due at age 66.   Mammogram: DUE. Ordered today. Pt provided with contact information and advised to call to schedule appt.   Bone Density: Due at age 21.   Lung Cancer Screening: (Low Dose CT Chest recommended if Age 19-80 years, 30 pack-year currently smoking OR have quit w/in 15years.) does not qualify.   Additional Screening:  Hepatitis C Screening: does qualify; postponed  Vision Screening: Recommended annual ophthalmology exams for early detection of glaucoma and other disorders of the eye. Is the patient up to date with their annual eye exam?  No  Who is the provider or what is the name of the office in which  the pt attends annual eye exams? Not established If pt is not established with a provider, would they like to be referred to a provider to establish care? No .   Dental Screening: Recommended annual dental exams for proper oral hygiene  Community Resource Referral:  CRR required this visit?  No      Plan:    I have personally reviewed and addressed the Medicare Annual Wellness questionnaire and have noted the following in the patient's chart:  A. Medical and social history B. Use of alcohol, tobacco or illicit drugs  C. Current medications and supplements D. Functional ability and status E.  Nutritional status F.  Physical activity G. Advance directives H. List of other physicians I.  Hospitalizations, surgeries, and ER visits in previous 12 months J.  Vitals K. Screenings such as hearing and vision if needed, cognitive and depression L. Referrals and appointments   In addition, I have reviewed and discussed with patient certain preventive protocols, quality metrics, and best practice recommendations. A written personalized care plan for preventive services as well as general preventive health recommendations were provided to patient.   Signed,  Reather Littler, LPN Nurse Health Advisor   Nurse Notes: pt states that she has started experienced urinary incontinence since having attempted epidural at L5-S1 at pain management and was sent home with steroids. Pt states sterois caused thrush and treatment given by Dr. Yetta Barre did not cure it and pt still has thrush. Pt has follow up with Dr. Cherylann Ratel tomorrow, advised to discuss with him then follow up with an appt with Dr. Yetta Barre if needed.

## 2019-09-16 NOTE — Patient Instructions (Signed)
[name] , Thank you for taking time to come for your Medicare Wellness Visit. I appreciate your ongoing commitment to your health goals. Please review the following plan we discussed and let me know if I can assist you in the future.   Screening recommendations/referrals: Mammogram: Please call [phone] to schedule your mammogram.  Recommended yearly ophthalmology/optometry visit for glaucoma screening and checkup Recommended yearly dental visit for hygiene and checkup  Vaccinations: Influenza vaccine: postponed Tdap vaccine: postponed Shingles vaccine: due age 49   Advanced directives: Advance directive discussed with you today. Even though you declined this today please call our office should you change your mind and we can give you the proper paperwork for you to fill out.  Conditions/risks identified: Keep up the great work!  Next appointment: Please follow up in one year for your Medicare Annual Wellness visit.    Preventive Care 40-64 Years, Female Preventive care refers to lifestyle choices and visits with your health care provider that can promote health and wellness. What does preventive care include?  A yearly physical exam. This is also called an annual well check.  Dental exams once or twice a year.  Routine eye exams. Ask your health care provider how often you should have your eyes checked.  Personal lifestyle choices, including:  Daily care of your teeth and gums.  Regular physical activity.  Eating a healthy diet.  Avoiding tobacco and drug use.  Limiting alcohol use.  Practicing safe sex.  Taking low-dose aspirin daily starting at age 48.  Taking vitamin and mineral supplements as recommended by your health care provider. What happens during an annual well check? The services and screenings done by your health care provider during your annual well check will depend on your age, overall health, lifestyle risk factors, and family history of  disease. Counseling  Your health care provider may ask you questions about your:  Alcohol use.  Tobacco use.  Drug use.  Emotional well-being.  Home and relationship well-being.  Sexual activity.  Eating habits.  Work and work Statistician.  Method of birth control.  Menstrual cycle.  Pregnancy history. Screening  You may have the following tests or measurements:  Height, weight, and BMI.  Blood pressure.  Lipid and cholesterol levels. These may be checked every 5 years, or more frequently if you are over 54 years old.  Skin check.  Lung cancer screening. You may have this screening every year starting at age 72 if you have a 30-pack-year history of smoking and currently smoke or have quit within the past 15 years.  Fecal occult blood test (FOBT) of the stool. You may have this test every year starting at age 83.  Flexible sigmoidoscopy or colonoscopy. You may have a sigmoidoscopy every 5 years or a colonoscopy every 10 years starting at age 16.  Hepatitis C blood test.  Hepatitis B blood test.  Sexually transmitted disease (STD) testing.  Diabetes screening. This is done by checking your blood sugar (glucose) after you have not eaten for a while (fasting). You may have this done every 1-3 years.  Mammogram. This may be done every 1-2 years. Talk to your health care provider about when you should start having regular mammograms. This may depend on whether you have a family history of breast cancer.  BRCA-related cancer screening. This may be done if you have a family history of breast, ovarian, tubal, or peritoneal cancers.  Pelvic exam and Pap test. This may be done every 3 years starting  at age 61. Starting at age 49, this may be done every 5 years if you have a Pap test in combination with an HPV test.  Bone density scan. This is done to screen for osteoporosis. You may have this scan if you are at high risk for osteoporosis. Discuss your test results,  treatment options, and if necessary, the need for more tests with your health care provider. Vaccines  Your health care provider may recommend certain vaccines, such as:  Influenza vaccine. This is recommended every year.  Tetanus, diphtheria, and acellular pertussis (Tdap, Td) vaccine. You may need a Td booster every 10 years.  Zoster vaccine. You may need this after age 47.  Pneumococcal 13-valent conjugate (PCV13) vaccine. You may need this if you have certain conditions and were not previously vaccinated.  Pneumococcal polysaccharide (PPSV23) vaccine. You may need one or two doses if you smoke cigarettes or if you have certain conditions. Talk to your health care provider about which screenings and vaccines you need and how often you need them. This information is not intended to replace advice given to you by your health care provider. Make sure you discuss any questions you have with your health care provider. Document Released: 06/24/2015 Document Revised: 02/15/2016 Document Reviewed: 03/29/2015 Elsevier Interactive Patient Education  2017 [employer] Prevention in the Home Falls can cause injuries. They can happen to people of all ages. There are many things you can do to make your home safe and to help prevent falls. What can I do on the outside of my home?  Regularly fix the edges of walkways and driveways and fix any cracks.  Remove anything that might make you trip as you walk through a door, such as a raised step or threshold.  Trim any bushes or trees on the path to your home.  Use bright outdoor lighting.  Clear any walking paths of anything that might make someone trip, such as rocks or tools.  Regularly check to see if handrails are loose or broken. Make sure that both sides of any steps have handrails.  Any raised decks and porches should have guardrails on the edges.  Have any leaves, snow, or ice cleared regularly.  Use sand or salt on walking  paths during winter.  Clean up any spills in your garage right away. This includes oil or grease spills. What can I do in the bathroom?  Use night lights.  Install grab bars by the toilet and in the tub and shower. Do not use towel bars as grab bars.  Use non-skid mats or decals in the tub or shower.  If you need to sit down in the shower, use a plastic, non-slip stool.  Keep the floor dry. Clean up any water that spills on the floor as soon as it happens.  Remove soap buildup in the tub or shower regularly.  Attach bath mats securely with double-sided non-slip rug tape.  Do not have throw rugs and other things on the floor that can make you trip. What can I do in the bedroom?  Use night lights.  Make sure that you have a light by your bed that is easy to reach.  Do not use any sheets or blankets that are too big for your bed. They should not hang down onto the floor.  Have a firm chair that has side arms. You can use this for support while you get dressed.  Do not have throw rugs and other  things on the floor that can make you trip. What can I do in the kitchen?  Clean up any spills right away.  Avoid walking on wet floors.  Keep items that you use a lot in easy-to-reach places.  If you need to reach something above you, use a strong step stool that has a grab bar.  Keep electrical cords out of the way.  Do not use floor polish or wax that makes floors slippery. If you must use wax, use non-skid floor wax.  Do not have throw rugs and other things on the floor that can make you trip. What can I do with my stairs?  Do not leave any items on the stairs.  Make sure that there are handrails on both sides of the stairs and use them. Fix handrails that are broken or loose. Make sure that handrails are as long as the stairways.  Check any carpeting to make sure that it is firmly attached to the stairs. Fix any carpet that is loose or worn.  Avoid having throw rugs at  the top or bottom of the stairs. If you do have throw rugs, attach them to the floor with carpet tape.  Make sure that you have a light switch at the top of the stairs and the bottom of the stairs. If you do not have them, ask someone to add them for you. What else can I do to help prevent falls?  Wear shoes that:  Do not have high heels.  Have rubber bottoms.  Are comfortable and fit you well.  Are closed at the toe. Do not wear sandals.  If you use a stepladder:  Make sure that it is fully opened. Do not climb a closed stepladder.  Make sure that both sides of the stepladder are locked into place.  Ask someone to hold it for you, if possible.  Clearly mark and make sure that you can see:  Any grab bars or handrails.  First and last steps.  Where the edge of each step is.  Use tools that help you move around (mobility aids) if they are needed. These include:  Canes.  Walkers.  Scooters.  Crutches.  Turn on the lights when you go into a dark area. Replace any light bulbs as soon as they burn out.  Set up your furniture so you have a clear path. Avoid moving your furniture around.  If any of your floors are uneven, fix them.  If there are any pets around you, be aware of where they are.  Review your medicines with your doctor. Some medicines can make you feel dizzy. This can increase your chance of falling. Ask your doctor what other things that you can do to help prevent falls. This information is not intended to replace advice given to you by your health care provider. Make sure you discuss any questions you have with your health care provider. Document Released: 03/24/2009 Document Revised: 11/03/2015 Document Reviewed: 07/02/2014 Elsevier Interactive Patient Education  2017 [employer].

## 2019-09-17 ENCOUNTER — Ambulatory Visit
Payer: Medicare Other | Attending: Student in an Organized Health Care Education/Training Program | Admitting: Student in an Organized Health Care Education/Training Program

## 2019-09-17 ENCOUNTER — Other Ambulatory Visit: Payer: Self-pay

## 2019-09-17 ENCOUNTER — Encounter: Payer: Self-pay | Admitting: Student in an Organized Health Care Education/Training Program

## 2019-09-17 DIAGNOSIS — B370 Candidal stomatitis: Secondary | ICD-10-CM

## 2019-09-17 DIAGNOSIS — M5416 Radiculopathy, lumbar region: Secondary | ICD-10-CM

## 2019-09-17 DIAGNOSIS — M5136 Other intervertebral disc degeneration, lumbar region: Secondary | ICD-10-CM

## 2019-09-17 DIAGNOSIS — G8929 Other chronic pain: Secondary | ICD-10-CM

## 2019-09-17 DIAGNOSIS — G894 Chronic pain syndrome: Secondary | ICD-10-CM | POA: Diagnosis not present

## 2019-09-17 DIAGNOSIS — M961 Postlaminectomy syndrome, not elsewhere classified: Secondary | ICD-10-CM | POA: Diagnosis not present

## 2019-09-17 DIAGNOSIS — M170 Bilateral primary osteoarthritis of knee: Secondary | ICD-10-CM

## 2019-09-17 MED ORDER — NYSTATIN 100000 UNIT/ML MT SUSP: 5.0000 mL | Freq: Three times a day (TID) | OROMUCOSAL | 2 refills | Status: DC

## 2019-09-17 MED ORDER — GABAPENTIN 300 MG PO CAPS: 600.0000 mg | ORAL_CAPSULE | Freq: Four times a day (QID) | ORAL | 5 refills | Status: DC

## 2019-09-17 NOTE — Progress Notes (Signed)
Brenda Berry: Brenda Berry  Service Category: E/M  Provider: Gillis Santa, MD  DOB: 05-26-1971  DOS: 09/17/2019  Location: Office  MRN: [id number]  Setting: Ambulatory outpatient  Referring Provider: Juline Patch, MD  Type: Established Brenda Berry  Specialty: Interventional Pain Management  PCP: Juline Patch, MD  Location: Home  Delivery: TeleHealth     Virtual Encounter - Pain Management PROVIDER NOTE: Information contained herein reflects review and annotations entered in association with encounter. Interpretation of such information and data should be left to medically-trained personnel. Information provided to Brenda Berry can be located elsewhere in the medical record under "Brenda Berry Instructions". Document created using STT-dictation technology, any transcriptional errors that may result from process are unintentional.    Contact & Pharmacy Preferred: [phone] Home: [phone] (home) Mobile: [phone] (mobile) E-mail: [email or web address]  [place] [facility], [place] [address] [place] [place] [address] Phone: [phone] Fax: [phone]   Pre-screening  Brenda Berry offered "in-person" vs "virtual" encounter. She indicated preferring virtual for this encounter.   Reason COVID-19*  Social distancing based on CDC and AMA recommendations.   I contacted [name] on 09/17/2019 via telephone.      I clearly identified myself as Gillis Santa, MD. I verified that I was speaking with the correct person using two identifiers (Name: Brenda Berry, and date of birth: [birth year]).  This visit was completed via telephone due to the restrictions of the COVID-19 pandemic. All issues as above were discussed and addressed but no physical exam was performed. If it was felt that the Brenda Berry should be evaluated in the office, they were directed there. The Brenda Berry verbally consented to this visit. Brenda Berry was unable to complete  an audio/visual visit due to Technical difficulties and/or Lack of internet. Due to the catastrophic nature of the COVID-19 pandemic, this visit was done through audio contact only.  Location of the Brenda Berry: home address (see Epic for details)  Location of the provider: office  Consent I sought verbal advanced consent from Brenda Berry for virtual visit interactions. I informed Brenda Berry of possible security and privacy concerns, risks, and limitations associated with providing "not-in-person" medical evaluation and management services. I also informed Brenda Berry of the availability of "in-person" appointments. Finally, I informed her that there would be a charge for the virtual visit and that she could be  personally, fully or partially, financially responsible for it. Brenda Berry expressed understanding and agreed to proceed.   Historic Elements   Brenda Berry is a 49 y.o. year old, female Brenda Berry evaluated today after her last contact with our practice on 09/16/2019. Brenda Berry  has a past medical history of Arthritis, Chronic neck and back pain, Degenerative disc disease, cervical, Degenerative disc disease, lumbar, GERD (gastroesophageal reflux disease), Hiatal hernia (04/26/2015), History of bronchitis (04/26/2015), History of hematuria (04/26/2015), History of nephrolithiasis (04/26/2015), History of recurrent UTI (urinary tract infection) (04/26/2015), Interstitial cystitis, and Status post hysterectomy (04/26/2015). She also  has a past surgical history that includes Back surgery; Vaginal hysterectomy; Bladder surgery; and Esophagogastroduodenoscopy (egd) with propofol (N/A, 01/15/2019). Brenda Berry has a current medication list which includes the following prescription(s): acetaminophen, albuterol, clotrimazole-betamethasone, gabapentin, nystatin, omeprazole, oxybutynin, tizanidine, amitriptyline, duloxetine, and duloxetine. She  reports that she has been smoking. She has a 13.00 pack-year smoking  history. She has never used smokeless tobacco. She reports current alcohol use. She reports that she does  not use drugs. Brenda Berry is allergic to elmiron [pentosan polysulfate]; ciprofloxacin; demerol [meperidine]; ibuprofen; iodinated diagnostic agents; latex; lodine [etodolac]; nifedipine; other; sulfa antibiotics; and terbutaline.   HPI  Today, she is being contacted for a post-procedure assessment.    Brenda Berry follows up today for postprocedural evaluation after left knee intra-articular steroid injection.  While she states that the injection was helpful for her left knee pain, she unfortunately developed oral thrush.  She does find benefit with nystatin mouthwash and is requesting a refill.  We also attempted to do a lumbar interlaminar epidural steroid injection however the Brenda Berry had a profound hyperalgesic response at L5-S1 with local.  Even with superficial needle entry, Brenda Berry was having significant pain likely secondary to scar tissue at that site.  We attempted to do a caudal however the Brenda Berry was in severe discomfort and pain so procedure was aborted.  Laboratory Chemistry Profile   Renal Lab Results  Component Value Date   BUN 6 07/25/2015   CREATININE 0.61 07/25/2015   GFRAA >60 07/25/2015   GFRNONAA >60 07/25/2015     Hepatic Lab Results  Component Value Date   AST 21 07/25/2015   ALT 18 07/25/2015   ALBUMIN 4.4 07/25/2015   ALKPHOS 68 07/25/2015     Electrolytes Lab Results  Component Value Date   NA 140 07/25/2015   K 4.1 07/25/2015   CL 107 07/25/2015   CALCIUM 9.3 07/25/2015   MG 2.2 07/25/2015     Bone No results found for: VD25OH, VD125OH2TOT, PX1062IR4, WN4627OJ5, 25OHVITD1, 25OHVITD2, 25OHVITD3, TESTOFREE, TESTOSTERONE   Inflammation (CRP: Acute Phase) (ESR: Chronic Phase) Lab Results  Component Value Date   CRP 0.6 07/25/2015   ESRSEDRATE 8 07/25/2015       Note: Above Lab results reviewed.  Imaging  DG PAIN CLINIC C-ARM 1-60 MIN NO  REPORT Fluoro was used, but no Radiologist interpretation will be provided.  Please refer to "NOTES" tab for provider progress note.  Assessment  The primary encounter diagnosis was Lumbar radiculopathy. Diagnoses of Oral thrush, Other intervertebral disc degeneration, lumbar region, Chronic radicular lumbar pain, Lumbar post-laminectomy syndrome, Failed back surgical syndrome, Chronic pain syndrome, and Bilateral primary osteoarthritis of knee were also pertinent to this visit.  Plan of Care   Ms. TYNASIA MCCAUL has a current medication list which includes the following long-term medication(s): albuterol, gabapentin, omeprazole, amitriptyline, duloxetine, and duloxetine.   1.  Failed back surgical syndrome, chronic lumbar radiculopathy: Attempted L5-S1 ESI as well as caudal but Brenda Berry unable to tolerate due to severe discomfort from scar tissue in the L4-L5 region.  Brenda Berry had a pretty significant hyperalgesic response to the injection.  She has a history of L4-L5 laminectomy.  Will refer to Dr. Mearl Latin at [facility] for consideration of spinal cord stimulator trial possibly under [place].  2.  Chronic lumbar radicular pain with acute radiculopathy: Brenda Berry endorsing worsening weakness of bilateral lower extremities and urinary incontinence.  She does have a history of bladder discomfort and interstitial cystitis but she states that this feels different..  Will obtain MRI of lumbar spine.  3.  Oral candidiasis from intra-articular knee steroid injection: Refill nystatin as below  4.  Left knee arthropathy: Did obtain benefit with intra-articular left knee steroid injection but developed pretty severe oral candidiasis.  Is taking nystatin for it.  Discussed a nonsteroid-based injectable such as Hyalgan.  Brenda Berry would like to try.  We'll get her scheduled for a left knee intra-articular Hyalgan injection.  5.  Chronic  pain syndrome: Refill gabapentin as below.   Pharmacotherapy (Medications Ordered): Meds  ordered this encounter  Medications  . nystatin (MYCOSTATIN) 100000 UNIT/ML suspension    Sig: Take 5 mLs (500,000 Units total) by mouth 3 (three) times daily. Swish and swallow    Dispense:  60 mL    Refill:  2  . gabapentin (NEURONTIN) 300 MG capsule    Sig: Take 2 capsules (600 mg total) by mouth 4 (four) times daily.    Dispense:  240 capsule    Refill:  5   Orders:  Orders Placed This Encounter  Procedures  . KNEE INJECTION    Hyalgan knee injection. Please order Hyalgan.    Standing Status:   Future    Standing Expiration Date:   10/17/2019    Scheduling Instructions:     Procedure: Intra-articular Hyalgan Knee injection            Side: Bilateral     Sedation: None     Timeframe: in two (2) weeks    Order Specific Question:   Where will this procedure be performed?    Answer:   [facility]  . MR LUMBAR SPINE WO CONTRAST    Brenda Berry presents with axial pain with possible radicular component.  In addition to any acute findings, please report on:  1. Facet (Zygapophyseal) joint DJD (Hypertrophy, space narrowing, subchondral sclerosis, and/or osteophyte formation) 2. DDD and/or IVDD (Loss of disc height, desiccation or "Black disc disease") 3. Pars defects 4. Spondylolisthesis, spondylosis, and/or spondyloarthropathies (include Degree/Grade of displacement in mm) 5. Vertebral body Fractures, including age (old, new/acute) 66. Modic Type Changes 7. Demineralization 8. Bone pathology 9. Central, Lateral Recess, and/or Foraminal Stenosis (include AP diameter of stenosis in mm) 10. Surgical changes (hardware type, status, and presence of fibrosis)  NOTE: Please specify level(s) and laterality.    Standing Status:   Future    Standing Expiration Date:   12/17/2019    Order Specific Question:   What is the Brenda Berry's sedation requirement?    Answer:   No Sedation    Order Specific Question:   Does the Brenda Berry have a pacemaker or implanted devices?    Answer:   No    Order  Specific Question:   Preferred imaging location?    Answer:   [facility] (table limit-350lbs)    Order Specific Question:   Call Results- Best Contact Number?    Answer:   [phone] ([facility])    Order Specific Question:   Radiology Contrast Protocol - do NOT remove file path    Answer:   [email or web address]  . Ambulatory referral to Neurosurgery    Referral Priority:   Routine    Referral Type:   Surgical    Referral Reason:   Specialty Services Required    Referred to Provider:   Sarina Ill, MD    Requested Specialty:   Neurosurgery    Number of Visits Requested:   1   Follow-up plan:   Return in about 2 weeks (around 10/01/2019) for Left knee Hyalgan.    Recent Visits Date Type Provider Dept  08/19/19 Procedure visit Gillis Santa, MD [facility]  08/06/19 Office Visit Gillis Santa, MD [facility]  Showing recent visits within past 90 days and meeting all other requirements   Today's Visits Date Type Provider Dept  09/17/19 Office Visit Gillis Santa, MD [facility]  Showing today's visits and meeting all other requirements   Future Appointments  No visits were found meeting these conditions.  Showing future appointments within next 90 days and meeting all other requirements   I discussed the assessment and treatment plan with the Brenda Berry. The Brenda Berry was provided an opportunity to ask questions and all were answered. The Brenda Berry agreed with the plan and demonstrated an understanding of the instructions.  Brenda Berry advised to call back or seek an in-person evaluation if the symptoms or condition worsens.  Duration of encounter: 35 minutes.  Note by: Gillis Santa, MD Date: 09/17/2019; Time: 2:11 PM

## 2019-09-30 ENCOUNTER — Ambulatory Visit
Payer: Medicare Other | Attending: Student in an Organized Health Care Education/Training Program | Admitting: Student in an Organized Health Care Education/Training Program

## 2019-09-30 ENCOUNTER — Other Ambulatory Visit: Payer: Self-pay

## 2019-09-30 ENCOUNTER — Encounter: Payer: Self-pay | Admitting: Student in an Organized Health Care Education/Training Program

## 2019-09-30 VITALS — BP 116/81 | HR 110 | Temp 97.9°F | Resp 16 | Ht 62.0 in | Wt 165.0 lb

## 2019-09-30 DIAGNOSIS — M170 Bilateral primary osteoarthritis of knee: Secondary | ICD-10-CM | POA: Insufficient documentation

## 2019-09-30 MED ORDER — LIDOCAINE HCL (PF) 1 % IJ SOLN
5.0000 mL | Freq: Once | INTRAMUSCULAR | Status: AC
  Administered 2019-09-30: 5 mL

## 2019-09-30 MED ORDER — SODIUM HYALURONATE (VISCOSUP) 20 MG/2ML IX SOSY
2.0000 mL | PREFILLED_SYRINGE | Freq: Once | INTRA_ARTICULAR | Status: AC
  Administered 2019-09-30: 2 mL via INTRA_ARTICULAR

## 2019-09-30 MED ORDER — LIDOCAINE HCL (PF) 1 % IJ SOLN
INTRAMUSCULAR | Status: AC
  Filled 2019-09-30: qty 10

## 2019-09-30 NOTE — Patient Instructions (Signed)

## 2019-09-30 NOTE — Progress Notes (Signed)
PROVIDER NOTE: Information contained herein reflects review and annotations entered in association with encounter. Interpretation of such information and data should be left to medically-trained personnel. Information provided to patient can be located elsewhere in the medical record under "Patient Instructions". Document created using STT-dictation technology, any transcriptional errors that may result from process are unintentional.    Patient: Brenda Berry  Service Category: Procedure  Provider: Edward Jolly, MD  DOB: [birth year]  DOS: 09/30/2019  Location: [facility]  MRN: [id number]  Setting: Ambulatory - outpatient  Referring Provider: Duanne Limerick, MD  Type: Established Patient  Specialty: Interventional Pain Management  PCP: Duanne Limerick, MD   Primary Reason for Visit: Interventional Pain Management Treatment. CC: Knee Pain (bilateral)  Procedure:          Anesthesia, Analgesia, Anxiolysis:  Type: Diagnostic Intra-Articular Hyalgan Knee Injection #1 (failed IA steroid knee, caused thrush) Region: Medial infrapatellar Knee Region Level: Knee Joint Laterality: Bilateral  Type: Local Anesthesia Indication(s): Analgesia         Local Anesthetic: Lidocaine 1-2% Route: Infiltration ([place]/IM) IV Access: Declined Sedation: Declined   Position: Sitting   Indications: 1. Bilateral primary osteoarthritis of knee    Pain Score: Pre-procedure: 6 /10 Post-procedure: 6 /10   Pre-op Assessment:  Brenda Berry is a 49 y.o. (year old), female patient, seen today for interventional treatment. She  has a past surgical history that includes Back surgery; Vaginal hysterectomy; Bladder surgery; and Esophagogastroduodenoscopy (egd) with propofol (N/A, 01/15/2019). Brenda Berry has a current medication list which includes the following prescription(s): acetaminophen, albuterol, amitriptyline, clotrimazole-betamethasone, duloxetine, duloxetine, gabapentin, nystatin, omeprazole, oxybutynin, and  tizanidine, and the following Facility-Administered Medications: sodium hyaluronate. Her primarily concern today is the Knee Pain (bilateral)  Initial Vital Signs:  Pulse/HCG Rate: (!) 105  Temp: 97.9 F (36.6 C) Resp: 18 BP: 139/75 SpO2: 99 %  BMI: Estimated body mass index is 30.18 kg/m as calculated from the following:   Height as of this encounter: 5\' 2"  (1.575 m).   Weight as of this encounter: 165 lb (74.8 kg).  Risk Assessment: Allergies: Reviewed. She is allergic to elmiron [pentosan polysulfate]; ciprofloxacin; demerol [meperidine]; ibuprofen; iodinated diagnostic agents; latex; lodine [etodolac]; nifedipine; other; sulfa antibiotics; and terbutaline.  Allergy Precautions: None required Coagulopathies: Reviewed. None identified.  Blood-thinner therapy: None at this time Active Infection(s): Reviewed. None identified. Brenda Berry is afebrile  Site Confirmation: Brenda Berry was asked to confirm the procedure and laterality before marking the site Procedure checklist: Completed Consent: Before the procedure and under the influence of no sedative(s), amnesic(s), or anxiolytics, the patient was informed of the treatment options, risks and possible complications. To fulfill our ethical and legal obligations, as recommended by the American Medical Association's Code of Ethics, I have informed the patient of my clinical impression; the nature and purpose of the treatment or procedure; the risks, benefits, and possible complications of the intervention; the alternatives, including doing nothing; the risk(s) and benefit(s) of the alternative treatment(s) or procedure(s); and the risk(s) and benefit(s) of doing nothing. The patient was provided information about the general risks and possible complications associated with the procedure. These may include, but are not limited to: failure to achieve desired goals, infection, bleeding, organ or nerve damage, allergic reactions, paralysis, and  death. In addition, the patient was informed of those risks and complications associated to the procedure, such as failure to decrease pain; infection; bleeding; organ or nerve damage with subsequent damage to sensory, motor, and/or autonomic systems,  resulting in permanent pain, numbness, and/or weakness of one or several areas of the body; allergic reactions; (i.e.: anaphylactic reaction); and/or death. Furthermore, the patient was informed of those risks and complications associated with the medications. These include, but are not limited to: allergic reactions (i.e.: anaphylactic or anaphylactoid reaction(s)); adrenal axis suppression; blood sugar elevation that in diabetics may result in ketoacidosis or comma; water retention that in patients with history of congestive heart failure may result in shortness of breath, pulmonary edema, and decompensation with resultant heart failure; weight gain; swelling or edema; medication-induced neural toxicity; particulate matter embolism and blood vessel occlusion with resultant organ, and/or nervous system infarction; and/or aseptic necrosis of one or more joints. Finally, the patient was informed that Medicine is not an exact science; therefore, there is also the possibility of unforeseen or unpredictable risks and/or possible complications that may result in a catastrophic outcome. The patient indicated having understood very clearly. We have given the patient no guarantees and we have made no promises. Enough time was given to the patient to ask questions, all of which were answered to the patient's satisfaction. Brenda Berry has indicated that she wanted to continue with the procedure. Attestation: I, the ordering provider, attest that I have discussed with the patient the benefits, risks, side-effects, alternatives, likelihood of achieving goals, and potential problems during recovery for the procedure that I have provided informed consent. Date  Time: 09/30/2019   9:17 AM  Pre-Procedure Preparation:  Monitoring: As per clinic protocol. Respiration, ETCO2, SpO2, BP, heart rate and rhythm monitor placed and checked for adequate function Safety Precautions: Patient was assessed for positional comfort and pressure points before starting the procedure. Time-out: I initiated and conducted the "Time-out" before starting the procedure, as per protocol. The patient was asked to participate by confirming the accuracy of the "Time Out" information. Verification of the correct person, site, and procedure were performed and confirmed by me, the nursing staff, and the patient. "Time-out" conducted as per Joint Commission's Universal Protocol (UP.01.01.01). Time: 0937  Description of Procedure:          Target Area: Knee Joint Approach: Just above the Lateral tibial plateau, lateral to the infrapatellar tendon. Area Prepped: Entire knee area, from the mid-thigh to the mid-shin. DuraPrep (Iodine Povacrylex [0.7% available iodine] and Isopropyl Alcohol, 74% w/w) Safety Precautions: Aspiration looking for blood return was conducted prior to all injections. At no point did we inject any substances, as a needle was being advanced. No attempts were made at seeking any paresthesias. Safe injection practices and needle disposal techniques used. Medications properly checked for expiration dates. SDV (single dose vial) medications used. Description of the Procedure: Protocol guidelines were followed. The patient was placed in position over the fluoroscopy table. The target area was identified and the area prepped in the usual manner. Skin & deeper tissues infiltrated with local anesthetic. Appropriate amount of time allowed to pass for local anesthetics to take effect. The procedure needles were then advanced to the target area. Proper needle placement secured. Negative aspiration confirmed. Solution injected in intermittent fashion, asking for systemic symptoms every 0.5cc of  injectate. The needles were then removed and the area cleansed, making sure to leave some of the prepping solution back to take advantage of its long term bactericidal properties. Vitals:   09/30/19 0919 09/30/19 0944  BP: 139/75 116/81  Pulse: (!) 105 (!) 110  Resp: 18 16  Temp: 97.9 F (36.6 C)   TempSrc: Temporal   SpO2: 99% 100%  Weight: 165 lb (74.8 kg)   Height: 5\' 2"  (1.575 m)     Start Time: 0937 hrs. End Time: 0941 hrs. Materials:  Needle(s) Type: Regular needle Gauge: 25G Length: 1.5-in Medication(s): Please see orders for medications and dosing details.  Imaging Guidance:          Type of Imaging Technique: None used Indication(s): N/A Exposure Time: No patient exposure Contrast: None used. Fluoroscopic Guidance: N/A Ultrasound Guidance: N/A Interpretation: N/A  Antibiotic Prophylaxis:   Anti-infectives (From admission, onward)   None     Indication(s): None identified  Post-operative Assessment:  Post-procedure Vital Signs:  Pulse/HCG Rate: (!) 110  Temp: 97.9 F (36.6 C) Resp: 16 BP: 116/81 SpO2: 100 %  EBL: None  Complications: No immediate post-treatment complications observed by team, or reported by patient.  Note: The patient tolerated the entire procedure well. A repeat set of vitals were taken after the procedure and the patient was kept under observation following institutional policy, for this type of procedure. Post-procedural neurological assessment was performed, showing return to baseline, prior to discharge. The patient was provided with post-procedure discharge instructions, including a section on how to identify potential problems. Should any problems arise concerning this procedure, the patient was given instructions to immediately contact us, at any time, without hesitation. In any case, we plan to contact the patient by telephone for a follow-up status report regarding this interventional procedure.  Comments:  No additional relevant  information.  Plan of Care  Orders:  Orders Placed This Encounter  Procedures  . KNEE INJECTION    Hyalgan knee injection. Please order Hyalgan.    Standing Status:   Future    Standing Expiration Date:   10/30/2019    Scheduling Instructions:     Procedure: Intra-articular Hyalgan Knee injection            Side: Bilateral     Sedation: None     Timeframe: in 6 weeks    Order Specific Question:   Where will this procedure be performed?    Answer:   [facility]   Medications ordered for procedure: Meds ordered this encounter  Medications  . lidocaine (PF) (XYLOCAINE) 1 % injection 5 mL  . Sodium Hyaluronate SOSY 2 mL  . Sodium Hyaluronate SOSY 2 mL   Medications administered: We administered lidocaine (PF) and Sodium Hyaluronate.  See the medical record for exact dosing, route, and time of administration.  Follow-up plan:   Return in about 6 weeks (around 11/11/2019) for B/L Hyalgan #2.     Recent Visits Date Type Provider Dept  09/17/19 Office Visit Gillis Santa, MD [facility]  08/19/19 Procedure visit Gillis Santa, MD [facility]  08/06/19 Office Visit Gillis Santa, MD [facility]  Showing recent visits within past 90 days and meeting all other requirements   Today's Visits Date Type Provider Dept  09/30/19 Procedure visit Gillis Santa, MD [facility]  Showing today's visits and meeting all other requirements   Future Appointments Date Type Provider Dept  11/11/19 Appointment Gillis Santa, MD [facility]  Showing future appointments within next 90 days and meeting all other requirements   Disposition: Discharge home  Discharge (Date  Time): 09/30/2019; 0955 hrs.   Primary Care Physician: Juline Patch, MD Location: [facility] Note by: Gillis Santa, MD Date: 09/30/2019; Time: 10:35 AM  Disclaimer:  Medicine is not an exact science. The only guarantee in medicine is  that nothing  is guaranteed. It is important to note that the decision to proceed with this intervention was based on the information collected from the patient. The Data and conclusions were drawn from the patient's questionnaire, the interview, and the physical examination. Because the information was provided in large part by the patient, it cannot be guaranteed that it has not been purposely or unconsciously manipulated. Every effort has been made to obtain as much relevant data as possible for this evaluation. It is important to note that the conclusions that lead to this procedure are derived in large part from the available data. Always take into account that the treatment will also be dependent on availability of resources and existing treatment guidelines, considered by other Pain Management Practitioners as being common knowledge and practice, at the time of the intervention. For Medico-Legal purposes, it is also important to point out that variation in procedural techniques and pharmacological choices are the acceptable norm. The indications, contraindications, technique, and results of the above procedure should only be interpreted and judged by a Board-Certified Interventional Pain Specialist with extensive familiarity and expertise in the same exact procedure and technique.

## 2019-09-30 NOTE — Progress Notes (Signed)
Safety precautions to be maintained throughout the outpatient stay will include: orient to surroundings, keep bed in low position, maintain call bell within reach at all times, provide assistance with transfer out of bed and ambulation.  

## 2019-10-01 ENCOUNTER — Telehealth: Payer: Self-pay | Admitting: *Deleted

## 2019-10-01 ENCOUNTER — Telehealth: Payer: Self-pay | Admitting: Family Medicine

## 2019-10-01 NOTE — Telephone Encounter (Signed)
Contacted [employer]. Spoke with representative regarding affordable housing. Representative stated that patient will need to stop by their office at [address] in [place] to pick up an application. The application will have to be completed along with any requested information and placed in the drop box outside the office door. Once application is received and processed individual will be placed on the waiting list. Once name comes up on waiting list patient will be contacted. [employer] will pay 30% of an individuals income.

## 2019-10-01 NOTE — Telephone Encounter (Signed)
No problems post procedure. 

## 2019-10-01 NOTE — Telephone Encounter (Signed)
   SF 10/01/2019   Name: Brenda Berry   MRN: [id number]   DOB: May 30, 1971   AGE: 49 y.o.   GENDER: female   PCP Duanne Limerick, MD.   Called pt regarding Community Resource Referral for housing and financial resources. Patient stated that she didn't remember talking about getting help with housing resources, but she stated she could have mention something about her not wanting to live with her mother when she had her Annual Wellness Visit at the beginning of April. Gave patient resource for [employer] and let her know she needs to go by their office and pick up an applicaiton and complete and return it in their drop box. Once applicaiton is processed patient can be put on the waiting list. Patient stated understanding. [name] said that she mostly needs help with paying for medications and medical bills. She stated she had Medicaid as a secondary insurance and that it used to cover her prescriptions and co-pays, but her Medicaid benefits stopped and she doesn't know why. Patient stated that she has an insurance agent that comes to her home to help her complete her paperwork. Informed patient that she may need to reach out to [facility] and speak with her Caseworker to see if she can reapply for benefits. Also informed patient that Care Guide will try to look for resources that may help cover her cost for prescriptions and medical bills and do a follow call next week around October 07, 2019. Patient stated understanding.     [facility] Care Guide, Embedded Care Coordination [facility], Care Management Phone: [phone] Email: [email or web address]

## 2019-10-09 NOTE — Telephone Encounter (Signed)
   SF 10/09/2019   Name: Brenda Berry   MRN: [id number]   DOB: Mar 22, 1971   AGE: 49 y.o.   GENDER: female   PCP Duanne Limerick, MD.   Called pt regarding Community Resource Referral for housing and financial resources. Patient stated that she has not contacted [employer], but will be going to their office at the beginning of May to complete application. Gave patient information for [facility] Mediation Assistance Kathie Rhodes Reardan. [phone]) for help with prescriptions and also gave patient information for [facility] [phone]) for assistance with medical bills.  Patient stated that she will give both organizations a call. Care Guide will contact patient in a week to see if she was able to get help with the resources provided.    [facility] Care Guide, Embedded Care Coordination [facility], Care Management Phone: [phone] Email: [email or web address]--

## 2019-10-10 ENCOUNTER — Other Ambulatory Visit: Payer: Self-pay

## 2019-10-10 ENCOUNTER — Ambulatory Visit
Admission: RE | Admit: 2019-10-10 | Discharge: 2019-10-10 | Disposition: A | Discharge status: Home / Self Care | Payer: Medicare Other | Source: Ambulatory Visit | Attending: Student in an Organized Health Care Education/Training Program | Admitting: Student in an Organized Health Care Education/Training Program

## 2019-10-10 DIAGNOSIS — M5416 Radiculopathy, lumbar region: Secondary | ICD-10-CM

## 2019-10-10 DIAGNOSIS — M961 Postlaminectomy syndrome, not elsewhere classified: Secondary | ICD-10-CM

## 2019-10-12 ENCOUNTER — Telehealth: Payer: Self-pay | Admitting: Student in an Organized Health Care Education/Training Program

## 2019-10-12 MED ORDER — DIAZEPAM 5 MG PO TABS: 5.0000 mg | ORAL_TABLET | Freq: Once | ORAL | 0 refills | Status: DC | PRN

## 2019-10-12 NOTE — Telephone Encounter (Signed)
Patient could not complete MRI, she is extremely claustrophobic, she thought her head would be out of machine but was not. She is going to reschedule MRI but will need medication to help her complete the study. Please notify patient if or when the medication is called in. Thank you

## 2019-10-12 NOTE — Telephone Encounter (Signed)
Patient notified

## 2019-10-13 ENCOUNTER — Other Ambulatory Visit: Payer: Self-pay

## 2019-10-13 ENCOUNTER — Ambulatory Visit
Admission: RE | Admit: 2019-10-13 | Discharge: 2019-10-13 | Disposition: A | Discharge status: Home / Self Care | Payer: Medicare Other | Source: Ambulatory Visit | Attending: Student in an Organized Health Care Education/Training Program | Admitting: Student in an Organized Health Care Education/Training Program

## 2019-10-13 DIAGNOSIS — M5416 Radiculopathy, lumbar region: Secondary | ICD-10-CM | POA: Diagnosis not present

## 2019-10-13 DIAGNOSIS — M961 Postlaminectomy syndrome, not elsewhere classified: Secondary | ICD-10-CM | POA: Insufficient documentation

## 2019-10-13 DIAGNOSIS — M545 Low back pain: Secondary | ICD-10-CM | POA: Diagnosis not present

## 2019-10-16 DIAGNOSIS — M5386 Other specified dorsopathies, lumbar region: Secondary | ICD-10-CM | POA: Diagnosis not present

## 2019-10-16 DIAGNOSIS — M48061 Spinal stenosis, lumbar region without neurogenic claudication: Secondary | ICD-10-CM | POA: Diagnosis not present

## 2019-10-19 ENCOUNTER — Other Ambulatory Visit: Payer: Self-pay

## 2019-10-19 ENCOUNTER — Encounter: Payer: Self-pay | Admitting: Obstetrics & Gynecology

## 2019-10-19 ENCOUNTER — Telehealth: Payer: Self-pay | Admitting: Family Medicine

## 2019-10-19 ENCOUNTER — Other Ambulatory Visit (HOSPITAL_COMMUNITY)
Admission: RE | Admit: 2019-10-19 | Discharge: 2019-10-19 | Disposition: A | Discharge status: Home / Self Care | Payer: Medicare Other | Source: Ambulatory Visit | Attending: Obstetrics & Gynecology | Admitting: Obstetrics & Gynecology

## 2019-10-19 ENCOUNTER — Ambulatory Visit (INDEPENDENT_AMBULATORY_CARE_PROVIDER_SITE_OTHER): Payer: Medicare Other | Admitting: Obstetrics & Gynecology

## 2019-10-19 VITALS — BP 100/60 | Ht 62.0 in | Wt 166.0 lb

## 2019-10-19 DIAGNOSIS — R87622 Low grade squamous intraepithelial lesion on cytologic smear of vagina (LGSIL): Secondary | ICD-10-CM | POA: Diagnosis not present

## 2019-10-19 DIAGNOSIS — Z1272 Encounter for screening for malignant neoplasm of vagina: Secondary | ICD-10-CM

## 2019-10-19 DIAGNOSIS — Z01419 Encounter for gynecological examination (general) (routine) without abnormal findings: Secondary | ICD-10-CM | POA: Diagnosis not present

## 2019-10-19 DIAGNOSIS — R87612 Low grade squamous intraepithelial lesion on cytologic smear of cervix (LGSIL): Secondary | ICD-10-CM | POA: Diagnosis not present

## 2019-10-19 DIAGNOSIS — Z1151 Encounter for screening for human papillomavirus (HPV): Secondary | ICD-10-CM | POA: Insufficient documentation

## 2019-10-19 DIAGNOSIS — Z1231 Encounter for screening mammogram for malignant neoplasm of breast: Secondary | ICD-10-CM

## 2019-10-19 DIAGNOSIS — N951 Menopausal and female climacteric states: Secondary | ICD-10-CM | POA: Diagnosis not present

## 2019-10-19 DIAGNOSIS — Z90710 Acquired absence of both cervix and uterus: Secondary | ICD-10-CM | POA: Insufficient documentation

## 2019-10-19 MED ORDER — ESTRADIOL 1 MG PO TABS: 1.0000 mg | ORAL_TABLET | Freq: Every day | ORAL | 11 refills | Status: DC

## 2019-10-19 NOTE — Patient Instructions (Signed)
PAP every 5 years Mammogram every year    Call [phone] to schedule at [facility] yearly (with PCP)  Estradiol tablets What is this medicine? ESTRADIOL (es tra DYE ole) is an estrogen. It is mostly used as hormone replacement in menopausal women. It helps to treat hot flashes and prevent osteoporosis. It is also used to treat women with low estrogen levels or those who have had their ovaries removed. This medicine may be used for other purposes; ask your health care provider or pharmacist if you have questions. COMMON BRAND NAME(S): Estrace, Femtrace, Gynodiol What should I tell my health care provider before I take this medicine? They need to know if you have or ever had any of these conditions:  abnormal vaginal bleeding  blood vessel disease or blood clots  breast, cervical, endometrial, ovarian, liver, or uterine cancer  dementia  diabetes  gallbladder disease  heart disease or recent heart attack  high blood pressure  high cholesterol  high level of calcium in the blood  hysterectomy  kidney disease  liver disease  migraine headaches  protein C deficiency  protein S deficiency  stroke  systemic lupus erythematosus (SLE)  tobacco smoker  an unusual or allergic reaction to estrogens, other hormones, medicines, foods, dyes, or preservatives  pregnant or trying to get pregnant  breast-feeding How should I use this medicine? Take this medicine by mouth. To reduce nausea, this medicine may be taken with food. Follow the directions on the prescription label. Take this medicine at the same time each day and in the order directed on the package. Do not take your medicine more often than directed. Contact your pediatrician regarding the use of this medicine in children. Special care may be needed. A patient package insert for the product will be given with each prescription and refill. Read this sheet carefully each time. The sheet may change  frequently. Overdosage: If you think you have taken too much of this medicine contact a poison control center or emergency room at once. NOTE: This medicine is only for you. Do not share this medicine with others. What if I miss a dose? If you miss a dose, take it as soon as you can. If it is almost time for your next dose, take only that dose. Do not take double or extra doses. What may interact with this medicine? Do not take this medicine with any of the following medications:  aromatase inhibitors like aminoglutethimide, anastrozole, exemestane, letrozole, testolactone This medicine may also interact with the following medications:  carbamazepine  certain antibiotics used to treat infections  certain barbiturates or benzodiazepines used for inducing sleep or treating seizures  grapefruit juice  medicines for fungus infections like itraconazole and ketoconazole  raloxifene or tamoxifen  rifabutin, rifampin, or rifapentine  ritonavir  St. John's Wort  warfarin This list may not describe all possible interactions. Give your health care provider a list of all the medicines, herbs, non-prescription drugs, or dietary supplements you use. Also tell them if you smoke, drink alcohol, or use illegal drugs. Some items may interact with your medicine. What should I watch for while using this medicine? Visit your doctor or health care professional for regular checks on your progress. You will need a regular breast and pelvic exam and Pap smear while on this medicine. You should also discuss the need for regular mammograms with your health care professional, and follow his or her guidelines for these tests. This medicine can make your body retain fluid, making your  fingers, hands, or ankles swell. Your blood pressure can go up. Contact your doctor or health care professional if you feel you are retaining fluid. If you have any reason to think you are pregnant, stop taking this medicine right  away and contact your doctor or health care professional. Smoking increases the risk of getting a blood clot or having a stroke while you are taking this medicine, especially if you are more than 47 years old. You are strongly advised not to smoke. If you wear contact lenses and notice visual changes, or if the lenses begin to feel uncomfortable, consult your eye doctor or health care professional. This medicine can increase the risk of developing a condition (endometrial hyperplasia) that may lead to cancer of the lining of the uterus. Taking progestins, another hormone drug, with this medicine lowers the risk of developing this condition. Therefore, if your uterus has not been removed (by a hysterectomy), your doctor may prescribe a progestin for you to take together with your estrogen. You should know, however, that taking estrogens with progestins may have additional health risks. You should discuss the use of estrogens and progestins with your health care professional to determine the benefits and risks for you. If you are going to have surgery, you may need to stop taking this medicine. Consult your health care professional for advice before you schedule the surgery. What side effects may I notice from receiving this medicine? Side effects that you should report to your doctor or health care professional as soon as possible:  allergic reactions like skin rash, itching or hives, swelling of the face, lips, or tongue  breast tissue changes or discharge  changes in vision  chest pain  confusion, trouble speaking or understanding  dark urine  general ill feeling or flu-like symptoms  light-colored stools  nausea, vomiting  pain, swelling, warmth in the leg  right upper belly pain  severe headaches  shortness of breath  sudden numbness or weakness of the face, arm or leg  trouble walking, dizziness, loss of balance or coordination  unusual vaginal bleeding  yellowing of the  eyes or skin Side effects that usually do not require medical attention (report to your doctor or health care professional if they continue or are bothersome):  hair loss  increased hunger or thirst  increased urination  symptoms of vaginal infection like itching, irritation or unusual discharge  unusually weak or tired This list may not describe all possible side effects. Call your doctor for medical advice about side effects. You may report side effects to FDA at 1-800-FDA-1088. Where should I keep my medicine? Keep out of the reach of children. Store at room temperature between 20 and 25 degrees C (68 and 77 degrees F). Keep container tightly closed. Protect from light. Throw away any unused medicine after the expiration date. NOTE: This sheet is a summary. It may not cover all possible information. If you have questions about this medicine, talk to your doctor, pharmacist, or health care provider.  2020 Elsevier/Gold Standard (2010-08-30 20:94:70)

## 2019-10-19 NOTE — Telephone Encounter (Signed)
° °  SF 10/19/2019    Name: Brenda Berry    MRN: [id number]    DOB: [birth year]    AGE: 49 y.o.    GENDER: female    PCP Duanne Limerick, MD.   Called pt regarding Community Resource Referral for housing and financial assistance. Patient stated that she will give Care Guide a call back regarding follow up on resources provided for assistance with prescriptions, housing, and medical bills.   [facility] Care Guide, Embedded Care Coordination [facility], Care Management Phone: [phone] Email: [email or web address]

## 2019-10-19 NOTE — Progress Notes (Signed)
HPI:      [name] is a 49 y.o. [phone] who LMP was No LMP recorded. Patient has had a hysterectomy., she presents today for her annual examination. The patient has complaints today of hot flashes, sweats, vaginal dryness.  Has back pain and disc issues.  Has IC, on meds. The patient is not currently sexually active. Her last pap: approximate date years ago and was normal and last mammogram: patient does not recall results of last mammogram. The patient does perform self breast exams.  There is no notable family history of breast or ovarian cancer in her family.  The patient has regular exercise: yes.  The patient denies current symptoms of depression.    GYN History: Contraception: status post hysterectomy  PMHx: Past Medical History:  Diagnosis Date  . Arthritis   . Chronic neck and back pain   . Degenerative disc disease, cervical   . Degenerative disc disease, lumbar   . GERD (gastroesophageal reflux disease)   . Hiatal hernia 04/26/2015  . History of bronchitis 04/26/2015  . History of hematuria 04/26/2015  . History of nephrolithiasis 04/26/2015  . History of recurrent UTI (urinary tract infection) 04/26/2015  . Interstitial cystitis   . Status post hysterectomy 04/26/2015   Past Surgical History:  Procedure Laterality Date  . BACK SURGERY    . BLADDER SURGERY    . ESOPHAGOGASTRODUODENOSCOPY (EGD) WITH PROPOFOL N/A 01/15/2019   Procedure: ESOPHAGOGASTRODUODENOSCOPY (EGD) WITH PROPOFOL;  Surgeon: Toney Reil, MD;  Location: [facility] ENDOSCOPY;  Service: Gastroenterology;  Laterality: N/A;  . VAGINAL HYSTERECTOMY     Family History  Problem Relation Age of Onset  . Diabetes Mother   . Hyperlipidemia Mother   . Hypertension Mother   . Heart disease Father    Social History   Tobacco Use  . Smoking status: Current Every Day Smoker    Packs/day: 0.50    Years: 26.00    Pack years: 13.00  . Smokeless tobacco: Never Used  Substance Use Topics  . Alcohol  use: Yes    Alcohol/week: 0.0 standard drinks    Comment: occ   . Drug use: No    Current Outpatient Medications:  .  acetaminophen (TYLENOL) 325 MG tablet, Take 650 mg by mouth every 4 (four) hours as needed. , Disp: , Rfl:  .  albuterol (PROVENTIL HFA;VENTOLIN HFA) 108 (90 Base) MCG/ACT inhaler, Inhale 2 puffs into the lungs every 6 (six) hours as needed for wheezing or shortness of breath., Disp: 1 Inhaler, Rfl: 2 .  clotrimazole-betamethasone (LOTRISONE) cream, Apply 1 application topically 2 (two) times daily., Disp: 30 g, Rfl: 0 .  diazepam (VALIUM) 5 MG tablet, Take 1 tablet (5 mg total) by mouth once as needed for up to 1 dose for anxiety (prior to MRI)., Disp: 1 tablet, Rfl: 0 .  DULoxetine (CYMBALTA) 30 MG capsule, Take 30 mg by mouth daily., Disp: , Rfl:  .  DULoxetine (CYMBALTA) 60 MG capsule, Take 60 mg by mouth daily. , Disp: , Rfl:  .  gabapentin (NEURONTIN) 300 MG capsule, Take 2 capsules (600 mg total) by mouth 4 (four) times daily., Disp: 240 capsule, Rfl: 5 .  nystatin (MYCOSTATIN) 100000 UNIT/ML suspension, Take 5 mLs (500,000 Units total) by mouth 3 (three) times daily. Swish and swallow, Disp: 60 mL, Rfl: 2 .  omeprazole (PRILOSEC) 40 MG capsule, TAKE ONE CAPSULE BY MOUTH DAILY BEFORE BREAKFAST, Disp: 30 capsule, Rfl: 0 .  oxybutynin (DITROPAN) 5 MG tablet, Take  1 tablet by mouth as needed., Disp: , Rfl:  .  tiZANidine (ZANAFLEX) 4 MG tablet, Take 1 tablet by mouth 3 (three) times daily., Disp: , Rfl:  .  amitriptyline (ELAVIL) 25 MG tablet, Take 1 tablet (25 mg total) by mouth at bedtime., Disp: 30 tablet, Rfl: 2 .  estradiol (ESTRACE) 1 MG tablet, Take 1 tablet (1 mg total) by mouth daily., Disp: 30 tablet, Rfl: 11 Allergies: Elmiron [pentosan polysulfate], Ciprofloxacin, Demerol [meperidine], Ibuprofen, Iodinated diagnostic agents, Latex, Lodine [etodolac], Nifedipine, Other, Sulfa antibiotics, and Terbutaline  Review of Systems  Constitutional: Positive for  malaise/fatigue. Negative for chills and fever.  HENT: Negative for congestion, sinus pain and sore throat.   Eyes: Negative for blurred vision and pain.  Respiratory: Negative for cough and wheezing.   Cardiovascular: Negative for chest pain and leg swelling.  Gastrointestinal: Negative for abdominal pain, constipation, diarrhea, heartburn, nausea and vomiting.  Genitourinary: Negative for dysuria, frequency, hematuria and urgency.  Musculoskeletal: Negative for back pain, joint pain, myalgias and neck pain.  Skin: Negative for itching and rash.  Neurological: Negative for dizziness, tremors and weakness.  Endo/Heme/Allergies: Does not bruise/bleed easily.  Psychiatric/Behavioral: Negative for depression. The patient is nervous/anxious. The patient does not have insomnia.     Objective: BP 100/60   Ht 5\' 2"  (1.575 m)   Wt 166 lb (75.3 kg)   BMI 30.36 kg/m   Filed Weights   10/19/19 1331  Weight: 166 lb (75.3 kg)   Body mass index is 30.36 kg/m. Physical Exam Constitutional:      General: She is not in acute distress.    Appearance: She is well-developed.  Genitourinary:     Pelvic exam was performed with patient supine.     Vagina and rectum normal.     No lesions in the vagina.     No vaginal bleeding.     No right or left adnexal mass present.     Right adnexa not tender.     Left adnexa not tender.     Genitourinary Comments: Absent Uterus Absent cervix Vaginal cuff well healed  HENT:     Head: Normocephalic and atraumatic. No laceration.     Right Ear: Hearing normal.     Left Ear: Hearing normal.     Mouth/Throat:     Pharynx: Uvula midline.  Eyes:     Pupils: Pupils are equal, round, and reactive to light.  Neck:     Thyroid: No thyromegaly.  Cardiovascular:     Rate and Rhythm: Normal rate and regular rhythm.     Heart sounds: No murmur. No friction rub. No gallop.   Pulmonary:     Effort: Pulmonary effort is normal. No respiratory distress.     Breath  sounds: Normal breath sounds. No wheezing.  Chest:     Breasts:        Right: No mass, skin change or tenderness.        Left: No mass, skin change or tenderness.  Abdominal:     General: Bowel sounds are normal. There is no distension.     Palpations: Abdomen is soft.     Tenderness: There is no abdominal tenderness. There is no rebound.  Musculoskeletal:        General: Normal range of motion.     Cervical back: Normal range of motion and neck supple.  Neurological:     Mental Status: She is alert and oriented to person, place, and time.  Cranial Nerves: No cranial nerve deficit.  Skin:    General: Skin is warm and dry.  Psychiatric:        Judgment: Judgment normal.  Vitals reviewed.     Assessment:  ANNUAL EXAM 1. Women's annual routine gynecological examination   2. Screening for vaginal cancer   3. Encounter for screening mammogram for malignant neoplasm of breast   4. Vasomotor symptoms due to menopause      Screening Plan:            1.  Vaginal Screening-  Pap smear done today  2. Breast screening- Exam annually and mammogram>40 planned   3. Colonoscopy every 10 years, Hemoccult testing - after age 34  4. Labs managed by PCP   5. Vasomotor symptoms due to menopause - options discussed, prefers ERT for sx control. Pros and cons discussed - estradiol (ESTRACE) 1 MG tablet; Take 1 tablet (1 mg total) by mouth daily.  Dispense: 30 tablet; Refill: 11 - HRT I have discussed HRT with the patient in detail.  The risk/benefits of it were reviewed.  She understands that during menopause Estrogen decreases dramatically and that this results in an increased risk of cardiovascular disease as well as osteoporosis.  We have also discussed the fact that hot flashes often result from a decrease in Estrogen, and that by replacing Estrogen, they can often be alleviated.  We have discussed skin, vaginal and urinary tract changes that may also take place from this drop in  Estrogen.  Emotional changes have also been linked to Estrogen and we have briefly discussed this.  The benefits of HRT including decrease in hot flashes, vaginal dryness, and osteoporosis were discussed.  The emotional benefit and a possible change in her cardiovascular risk profile was also reviewed.  The risks associated with Hormone Replacement Therapy were also reviewed.  The use of unopposed Estrogen and its relationship to endometrial cancer was discussed.  The addition of Progesterone and its beneficial effect on endometrial cancer was also noted.  The fact that there has been no consistent definitive studies showing an increase in breast cancer in women who use HRT was discussed with the patient.  The possible side effects including breast tenderness, fluid retention, mood changes and vaginal bleeding were discussed.  The patient was informed that this is an elective medication and that she may choose not to take Hormone Replacement Therapy.  Literature on HRT was given, and I believe that after answering all of the patient's questions, she has an adequate and informed understanding of HRT.  Special emphasis on the WHI study, as well as several studies since that pertaining to the risks and benefits of estrogen replacement therapy were compared.  The possible limitations of these studies were discussed including the age stratification of the WHI study.  The possible role of Progesterone in these studies was discussed in detail.  I believe that the patient has an informed knowledge of the risks and benefits of HRT.  I have specifically discussed WHI findings and current updates.  Different type of hormone formulation and methods of taking hormone replacement therapy discussed.      F/U  Return in about 1 year (around 10/18/2020) for Annual.  Annamarie Major, MD, [facility], [facility] 10/19/2019  1:58 PM

## 2019-10-21 LAB — CYTOLOGY - PAP
Comment: NEGATIVE
High risk HPV: NEGATIVE

## 2019-10-22 NOTE — Telephone Encounter (Signed)
   SF 10/22/2019   Name: Brenda Berry   MRN: [id number]   DOB: 1971/04/19   AGE: 49 y.o.   GENDER: female   PCP Duanne Limerick, MD.   Called pt regarding Community Resource Referral for housing and financial assistance. Left message for patient to follow up regarding resources provided. Informed patient that if she has any additional needs to contact the office. Referral will be closed.   Closing referral pending any other needs of patient.    [facility] Care Guide, Embedded Care Coordination [place], Care Management Phone: [phone] Email: [email or web address]

## 2019-10-26 DIAGNOSIS — M5416 Radiculopathy, lumbar region: Secondary | ICD-10-CM | POA: Diagnosis not present

## 2019-10-26 DIAGNOSIS — M549 Dorsalgia, unspecified: Secondary | ICD-10-CM | POA: Diagnosis not present

## 2019-10-26 DIAGNOSIS — M5136 Other intervertebral disc degeneration, lumbar region: Secondary | ICD-10-CM | POA: Diagnosis not present

## 2019-10-26 DIAGNOSIS — M5134 Other intervertebral disc degeneration, thoracic region: Secondary | ICD-10-CM | POA: Diagnosis not present

## 2019-11-11 ENCOUNTER — Ambulatory Visit: Payer: Medicare Other | Admitting: Student in an Organized Health Care Education/Training Program

## 2019-12-02 ENCOUNTER — Ambulatory Visit
Payer: Medicare Other | Attending: Student in an Organized Health Care Education/Training Program | Admitting: Student in an Organized Health Care Education/Training Program

## 2019-12-02 ENCOUNTER — Encounter: Payer: Self-pay | Admitting: Student in an Organized Health Care Education/Training Program

## 2019-12-02 ENCOUNTER — Other Ambulatory Visit: Payer: Self-pay

## 2019-12-02 VITALS — BP 125/85 | HR 96 | Temp 98.2°F | Resp 18 | Ht 62.0 in | Wt 165.0 lb

## 2019-12-02 DIAGNOSIS — M170 Bilateral primary osteoarthritis of knee: Secondary | ICD-10-CM | POA: Diagnosis not present

## 2019-12-02 MED ORDER — LIDOCAINE HCL (PF) 1 % IJ SOLN
5.0000 mL | Freq: Once | INTRAMUSCULAR | Status: AC
  Administered 2019-12-02: 5 mL
  Filled 2019-12-02: qty 5

## 2019-12-02 MED ORDER — SODIUM HYALURONATE (VISCOSUP) 20 MG/2ML IX SOSY
2.0000 mL | PREFILLED_SYRINGE | Freq: Once | INTRA_ARTICULAR | Status: AC
  Administered 2019-12-02: 2 mL via INTRA_ARTICULAR

## 2019-12-02 NOTE — Progress Notes (Signed)
PROVIDER NOTE: Information contained herein reflects review and annotations entered in association with encounter. Interpretation of such information and data should be left to medically-trained personnel. Information provided to patient can be located elsewhere in the medical record under "Patient Instructions". Document created using STT-dictation technology, any transcriptional errors that may result from process are unintentional.    Patient: Brenda Berry  Service Category: Procedure  Provider: Edward Jolly, MD  DOB: 1971/03/22  DOS: 12/02/2019  Location: [facility]  MRN: [id number]  Setting: Ambulatory - outpatient  Referring Provider: Duanne Limerick, MD  Type: Established Patient  Specialty: Interventional Pain Management  PCP: Duanne Limerick, MD   Primary Reason for Visit: Interventional Pain Management Treatment. CC: Knee Pain (bilateral)  Procedure:          Anesthesia, Analgesia, Anxiolysis:  Type: Therapeutic Intra-Articular Hyalgan Knee Injection #2 (failed IA steroid knee, caused thrush) Region: Medial infrapatellar Knee Region Level: Knee Joint Laterality: Bilateral  Type: Local Anesthesia Indication(s): Analgesia         Local Anesthetic: Lidocaine 1-2% Route: Infiltration ([place]/IM) IV Access: Declined Sedation: Declined   Position: Sitting   Indications: 1. Bilateral primary osteoarthritis of knee    Pain Score: Pre-procedure: 6 /10 Post-procedure: 4 /10   Pre-op Assessment:  Brenda Berry is a 49 y.o. (year old), female patient, seen today for interventional treatment. She  has a past surgical history that includes Back surgery; Vaginal hysterectomy; Bladder surgery; and Esophagogastroduodenoscopy (egd) with propofol (N/A, 01/15/2019). Brenda Berry has a current medication list which includes the following prescription(s): acetaminophen, albuterol, amitriptyline, estradiol, gabapentin, nystatin, omeprazole, oxybutynin, tizanidine, clotrimazole-betamethasone,  diazepam, duloxetine, and duloxetine. Her primarily concern today is the Knee Pain (bilateral)  Initial Vital Signs:  Pulse/HCG Rate: (!) 105ECG Heart Rate: (!) 4 Temp: 98.2 F (36.8 C) Resp: 17 BP: 127/73 SpO2: 100 %  BMI: Estimated body mass index is 30.18 kg/m as calculated from the following:   Height as of this encounter: 5\' 2"  (1.575 m).   Weight as of this encounter: 165 lb (74.8 kg).  Risk Assessment: Allergies: Reviewed. She is allergic to elmiron [pentosan polysulfate], ciprofloxacin, demerol [meperidine], ibuprofen, iodinated diagnostic agents, latex, lodine [etodolac], nifedipine, other, sulfa antibiotics, and terbutaline.  Allergy Precautions: None required Coagulopathies: Reviewed. None identified.  Blood-thinner therapy: None at this time Active Infection(s): Reviewed. None identified. Brenda Berry is afebrile  Site Confirmation: Brenda Berry was asked to confirm the procedure and laterality before marking the site Procedure checklist: Completed Consent: Before the procedure and under the influence of no sedative(s), amnesic(s), or anxiolytics, the patient was informed of the treatment options, risks and possible complications. To fulfill our ethical and legal obligations, as recommended by the American Medical Association's Code of Ethics, I have informed the patient of my clinical impression; the nature and purpose of the treatment or procedure; the risks, benefits, and possible complications of the intervention; the alternatives, including doing nothing; the risk(s) and benefit(s) of the alternative treatment(s) or procedure(s); and the risk(s) and benefit(s) of doing nothing. The patient was provided information about the general risks and possible complications associated with the procedure. These may include, but are not limited to: failure to achieve desired goals, infection, bleeding, organ or nerve damage, allergic reactions, paralysis, and death. In addition, the patient  was informed of those risks and complications associated to the procedure, such as failure to decrease pain; infection; bleeding; organ or nerve damage with subsequent damage to sensory, motor, and/or autonomic systems, resulting in  permanent pain, numbness, and/or weakness of one or several areas of the body; allergic reactions; (i.e.: anaphylactic reaction); and/or death. Furthermore, the patient was informed of those risks and complications associated with the medications. These include, but are not limited to: allergic reactions (i.e.: anaphylactic or anaphylactoid reaction(s)); adrenal axis suppression; blood sugar elevation that in diabetics may result in ketoacidosis or comma; water retention that in patients with history of congestive heart failure may result in shortness of breath, pulmonary edema, and decompensation with resultant heart failure; weight gain; swelling or edema; medication-induced neural toxicity; particulate matter embolism and blood vessel occlusion with resultant organ, and/or nervous system infarction; and/or aseptic necrosis of one or more joints. Finally, the patient was informed that Medicine is not an exact science; therefore, there is also the possibility of unforeseen or unpredictable risks and/or possible complications that may result in a catastrophic outcome. The patient indicated having understood very clearly. We have given the patient no guarantees and we have made no promises. Enough time was given to the patient to ask questions, all of which were answered to the patient's satisfaction. Brenda Berry has indicated that she wanted to continue with the procedure. Attestation: I, the ordering provider, attest that I have discussed with the patient the benefits, risks, side-effects, alternatives, likelihood of achieving goals, and potential problems during recovery for the procedure that I have provided informed consent. Date  Time: 12/02/2019  1:16 PM  Pre-Procedure  Preparation:  Monitoring: As per clinic protocol. Respiration, ETCO2, SpO2, BP, heart rate and rhythm monitor placed and checked for adequate function Safety Precautions: Patient was assessed for positional comfort and pressure points before starting the procedure. Time-out: I initiated and conducted the "Time-out" before starting the procedure, as per protocol. The patient was asked to participate by confirming the accuracy of the "Time Out" information. Verification of the correct person, site, and procedure were performed and confirmed by me, the nursing staff, and the patient. "Time-out" conducted as per Joint Commission's Universal Protocol (UP.01.01.01). Time: 1339  Description of Procedure:          Target Area: Knee Joint Approach: Just above the Medial tibial plateau, lateral to the infrapatellar tendon. Area Prepped: Entire knee area, from the mid-thigh to the mid-shin. DuraPrep (Iodine Povacrylex [0.7% available iodine] and Isopropyl Alcohol, 74% w/w) Safety Precautions: Aspiration looking for blood return was conducted prior to all injections. At no point did we inject any substances, as a needle was being advanced. No attempts were made at seeking any paresthesias. Safe injection practices and needle disposal techniques used. Medications properly checked for expiration dates. SDV (single dose vial) medications used. Description of the Procedure: Protocol guidelines were followed. The patient was placed in position over the fluoroscopy table. The target area was identified and the area prepped in the usual manner. Skin & deeper tissues infiltrated with local anesthetic. Appropriate amount of time allowed to pass for local anesthetics to take effect. The procedure needles were then advanced to the target area. Proper needle placement secured. Negative aspiration confirmed. Solution injected in intermittent fashion, asking for systemic symptoms every 0.5cc of injectate. The needles were then  removed and the area cleansed, making sure to leave some of the prepping solution back to take advantage of its long term bactericidal properties. Vitals:   12/02/19 1323 12/02/19 1324 12/02/19 1344  BP: 127/73  125/85  Pulse: (!) 105  96  Resp: 17  18  Temp:  98.2 F (36.8 C)   TempSrc:  Temporal  SpO2: 100%  98%  Weight: 165 lb (74.8 kg)    Height: 5\' 2"  (1.575 m)      Start Time: 1339 hrs. End Time: 1341 hrs. Materials:  Needle(s) Type: Regular needle Gauge: 25G Length: 1.5-in Medication(s): Please see orders for medications and dosing details. 2 cc of Hyalgan injected in each knee Imaging Guidance:          Type of Imaging Technique: None used Indication(s): N/A Exposure Time: No patient exposure Contrast: None used. Fluoroscopic Guidance: N/A Ultrasound Guidance: N/A Interpretation: N/A  Antibiotic Prophylaxis:   Anti-infectives (From admission, onward)   None     Indication(s): None identified  Post-operative Assessment:  Post-procedure Vital Signs:  Pulse/HCG Rate: 96(!) 4 Temp: 98.2 F (36.8 C) Resp: 18 BP: 125/85 SpO2: 98 %  EBL: None  Complications: No immediate post-treatment complications observed by team, or reported by patient.  Note: The patient tolerated the entire procedure well. A repeat set of vitals were taken after the procedure and the patient was kept under observation following institutional policy, for this type of procedure. Post-procedural neurological assessment was performed, showing return to baseline, prior to discharge. The patient was provided with post-procedure discharge instructions, including a section on how to identify potential problems. Should any problems arise concerning this procedure, the patient was given instructions to immediately contact us, at any time, without hesitation. In any case, we plan to contact the patient by telephone for a follow-up status report regarding this interventional procedure.  Comments:  No  additional relevant information.  Plan of Care  Orders:  Orders Placed This Encounter  Procedures  . KNEE INJECTION    Hyalgan knee injection. Please order Hyalgan.    Standing Status:   Future    Standing Expiration Date:   01/01/2020    Scheduling Instructions:     Procedure: Intra-articular Hyalgan Knee injection   #3         Side: Bilateral     Sedation: None     Timeframe: in 4 weeks    Order Specific Question:   Where will this procedure be performed?    Answer:   [facility]   Medications ordered for procedure: Meds ordered this encounter  Medications  . Sodium Hyaluronate SOSY 2 mL  . Sodium Hyaluronate SOSY 2 mL  . lidocaine (PF) (XYLOCAINE) 1 % injection 5 mL   Medications administered: We administered Sodium Hyaluronate, Sodium Hyaluronate, and lidocaine (PF).  See the medical record for exact dosing, route, and time of administration.  Follow-up plan:   Return in about 4 weeks (around 12/30/2019) for Hylagan #3, without sedation.     Recent Visits Date Type Provider Dept  09/30/19 Procedure visit Gillis Santa, MD [facility]  09/17/19 Office Visit Gillis Santa, MD [facility]  Showing recent visits within past 90 days and meeting all other requirements Today's Visits Date Type Provider Dept  12/02/19 Procedure visit Gillis Santa, MD [facility]  Showing today's visits and meeting all other requirements Future Appointments Date Type Provider Dept  01/06/20 Appointment Gillis Santa, MD [facility]  Showing future appointments within next 90 days and meeting all other requirements  Disposition: Discharge home  Discharge (Date  Time): 12/02/2019; 1345 hrs.   Primary Care Physician: Juline Patch, MD Location: [facility] Note by: Gillis Santa, MD Date: 12/02/2019; Time: 2:25 PM  Disclaimer:  Medicine is not an exact science. The only guarantee in medicine is that nothing  is  guaranteed. It is important to note that the decision to proceed with this intervention was based on the information collected from the patient. The Data and conclusions were drawn from the patient's questionnaire, the interview, and the physical examination. Because the information was provided in large part by the patient, it cannot be guaranteed that it has not been purposely or unconsciously manipulated. Every effort has been made to obtain as much relevant data as possible for this evaluation. It is important to note that the conclusions that lead to this procedure are derived in large part from the available data. Always take into account that the treatment will also be dependent on availability of resources and existing treatment guidelines, considered by other Pain Management Practitioners as being common knowledge and practice, at the time of the intervention. For Medico-Legal purposes, it is also important to point out that variation in procedural techniques and pharmacological choices are the acceptable norm. The indications, contraindications, technique, and results of the above procedure should only be interpreted and judged by a Board-Certified Interventional Pain Specialist with extensive familiarity and expertise in the same exact procedure and technique.

## 2019-12-02 NOTE — Patient Instructions (Signed)

## 2019-12-23 ENCOUNTER — Telehealth: Payer: Medicare Other | Admitting: Obstetrics & Gynecology

## 2019-12-23 ENCOUNTER — Other Ambulatory Visit: Payer: Self-pay

## 2020-01-06 ENCOUNTER — Ambulatory Visit
Payer: Medicare Other | Attending: Student in an Organized Health Care Education/Training Program | Admitting: Student in an Organized Health Care Education/Training Program

## 2020-01-06 ENCOUNTER — Other Ambulatory Visit: Payer: Self-pay

## 2020-01-06 ENCOUNTER — Encounter: Payer: Self-pay | Admitting: Student in an Organized Health Care Education/Training Program

## 2020-01-06 VITALS — BP 114/91 | HR 113 | Temp 97.4°F | Resp 17 | Ht 62.0 in | Wt 165.0 lb

## 2020-01-06 DIAGNOSIS — G894 Chronic pain syndrome: Secondary | ICD-10-CM | POA: Insufficient documentation

## 2020-01-06 DIAGNOSIS — M5416 Radiculopathy, lumbar region: Secondary | ICD-10-CM | POA: Insufficient documentation

## 2020-01-06 DIAGNOSIS — M170 Bilateral primary osteoarthritis of knee: Secondary | ICD-10-CM | POA: Insufficient documentation

## 2020-01-06 DIAGNOSIS — G8929 Other chronic pain: Secondary | ICD-10-CM | POA: Insufficient documentation

## 2020-01-06 MED ORDER — TIZANIDINE HCL 4 MG PO TABS: 4.0000 mg | ORAL_TABLET | Freq: Three times a day (TID) | ORAL | 5 refills | Status: DC

## 2020-01-06 MED ORDER — TIZANIDINE HCL 4 MG PO TABS: 4.0000 mg | ORAL_TABLET | Freq: Three times a day (TID) | ORAL | 2 refills | Status: AC | PRN

## 2020-01-06 MED ORDER — GABAPENTIN 300 MG PO CAPS: 600.0000 mg | ORAL_CAPSULE | Freq: Four times a day (QID) | ORAL | 5 refills | Status: AC

## 2020-01-06 MED ORDER — ORPHENADRINE CITRATE 30 MG/ML IJ SOLN
INTRAMUSCULAR | Status: AC
  Filled 2020-01-06: qty 2

## 2020-01-06 MED ORDER — SODIUM HYALURONATE (VISCOSUP) 20 MG/2ML IX SOSY
2.0000 mL | PREFILLED_SYRINGE | Freq: Once | INTRA_ARTICULAR | Status: AC
  Administered 2020-01-06: 2 mL via INTRA_ARTICULAR

## 2020-01-06 MED ORDER — LIDOCAINE HCL (PF) 1 % IJ SOLN
INTRAMUSCULAR | Status: AC
  Filled 2020-01-06: qty 5

## 2020-01-06 MED ORDER — KETOROLAC TROMETHAMINE 30 MG/ML IJ SOLN
30.0000 mg | Freq: Once | INTRAMUSCULAR | Status: AC
  Administered 2020-01-06: 30 mg via INTRAMUSCULAR

## 2020-01-06 MED ORDER — ORPHENADRINE CITRATE 30 MG/ML IJ SOLN
30.0000 mg | Freq: Once | INTRAMUSCULAR | Status: AC
  Administered 2020-01-06: 30 mg via INTRAMUSCULAR

## 2020-01-06 MED ORDER — LIDOCAINE HCL (PF) 1 % IJ SOLN
5.0000 mL | Freq: Once | INTRAMUSCULAR | Status: AC
  Administered 2020-01-06: 5 mL

## 2020-01-06 MED ORDER — KETOROLAC TROMETHAMINE 30 MG/ML IJ SOLN
INTRAMUSCULAR | Status: AC
  Filled 2020-01-06: qty 1

## 2020-01-06 NOTE — Progress Notes (Addendum)
Safety precautions to be maintained throughout the outpatient stay will include: orient to surroundings, keep bed in low position, maintain call bell within reach at all times, provide assistance with transfer out of bed and ambulation.   Patient states that her surgeon would not do her surgery r/t her still smoking. Patient states that she refuses to stop. Brenda Berry was very upset, crying and angry wanting something for pain. MD refilled pts gabapentin and muscle relaxers and will make an appt to discuss her options for pain.

## 2020-01-06 NOTE — Patient Instructions (Signed)

## 2020-01-06 NOTE — Progress Notes (Addendum)
PROVIDER NOTE: Information contained herein reflects review and annotations entered in association with encounter. Interpretation of such information and data should be left to medically-trained personnel. Information provided to patient can be located elsewhere in the medical record under "Patient Instructions". Document created using STT-dictation technology, any transcriptional errors that may result from process are unintentional.    Patient: Brenda Berry  Service Category: Procedure  Provider: Edward JollyBilal Kyleigh Nannini, MD  DOB: Aug 10, 1970  DOS: 01/06/2020  Location: [facility]  MRN: [id number]  Setting: Ambulatory - outpatient  Referring Provider: Duanne Berry, Deanna C, MD  Type: Established Patient  Specialty: Interventional Pain Management  PCP: Brenda Berry, Deanna C, MD   Primary Reason for Visit: Interventional Pain Management Treatment. CC: Knee Pain (bilateral)  Procedure:          Anesthesia, Analgesia, Anxiolysis:  Type: Therapeutic Intra-Articular Hyalgan Knee Injection #3 (failed IA steroid knee, caused thrush) Region: Medial infrapatellar Knee Region Level: Knee Joint Laterality: Bilateral  Type: Local Anesthesia Indication(s): Analgesia         Local Anesthetic: Lidocaine 1-2% Route: Infiltration ([place]/IM) IV Access: Declined Sedation: Declined   Position: Sitting   Indications: 1. Bilateral primary osteoarthritis of knee   2. Chronic radicular lumbar pain   3. Chronic pain syndrome    Pain Score: Pre-procedure: 7 /10 Post-procedure: 4  (knees when staning)/10   Pre-op Assessment:  Brenda Berry is a 49 y.o. (year old), female patient, seen today for interventional treatment. She  has a past surgical history that includes Back surgery; Vaginal hysterectomy; Bladder surgery; and Esophagogastroduodenoscopy (egd) with propofol (N/A, 01/15/2019). Brenda Berry has a current medication list which includes the following prescription(s): acetaminophen, albuterol, clotrimazole-betamethasone,  diazepam, estradiol, gabapentin, nystatin, omeprazole, oxybutynin, amitriptyline, and tizanidine. Her primarily concern today is the Knee Pain (bilateral)  Initial Vital Signs:  Pulse/HCG Rate: (!) 113  Temp: (!) 97.4 F (36.3 Berry) Resp: 17 BP: (!) 114/91 SpO2: 100 %  BMI: Estimated body mass index is 30.18 kg/m as calculated from the following:   Height as of this encounter: 5\' 2"  (1.575 m).   Weight as of this encounter: 165 lb (74.8 kg).  Risk Assessment: Allergies: Reviewed. She is allergic to elmiron [pentosan polysulfate], ciprofloxacin, demerol [meperidine], ibuprofen, iodinated diagnostic agents, latex, lodine [etodolac], nifedipine, other, sulfa antibiotics, and terbutaline.  Allergy Precautions: None required Coagulopathies: Reviewed. None identified.  Blood-thinner therapy: None at this time Active Infection(s): Reviewed. None identified. Brenda Berry is afebrile  Site Confirmation: Brenda Berry was asked to confirm the procedure and laterality before marking the site Procedure checklist: Completed Consent: Before the procedure and under the influence of no sedative(s), amnesic(s), or anxiolytics, the patient was informed of the treatment options, risks and possible complications. To fulfill our ethical and legal obligations, as recommended by the American Medical Association's Code of Ethics, I have informed the patient of my clinical impression; the nature and purpose of the treatment or procedure; the risks, benefits, and possible complications of the intervention; the alternatives, including doing nothing; the risk(s) and benefit(s) of the alternative treatment(s) or procedure(s); and the risk(s) and benefit(s) of doing nothing. The patient was provided information about the general risks and possible complications associated with the procedure. These may include, but are not limited to: failure to achieve desired goals, infection, bleeding, organ or nerve damage, allergic reactions,  paralysis, and death. In addition, the patient was informed of those risks and complications associated to the procedure, such as failure to decrease pain; infection; bleeding; organ or  nerve damage with subsequent damage to sensory, motor, and/or autonomic systems, resulting in permanent pain, numbness, and/or weakness of one or several areas of the body; allergic reactions; (i.e.: anaphylactic reaction); and/or death. Furthermore, the patient was informed of those risks and complications associated with the medications. These include, but are not limited to: allergic reactions (i.e.: anaphylactic or anaphylactoid reaction(s)); adrenal axis suppression; blood sugar elevation that in diabetics may result in ketoacidosis or comma; water retention that in patients with history of congestive heart failure may result in shortness of breath, pulmonary edema, and decompensation with resultant heart failure; weight gain; swelling or edema; medication-induced neural toxicity; particulate matter embolism and blood vessel occlusion with resultant organ, and/or nervous system infarction; and/or aseptic necrosis of one or more joints. Finally, the patient was informed that Medicine is not an exact science; therefore, there is also the possibility of unforeseen or unpredictable risks and/or possible complications that may result in a catastrophic outcome. The patient indicated having understood very clearly. We have given the patient no guarantees and we have made no promises. Enough time was given to the patient to ask questions, all of which were answered to the patient's satisfaction. Ms. Ammon has indicated that she wanted to continue with the procedure. Attestation: I, the ordering provider, attest that I have discussed with the patient the benefits, risks, side-effects, alternatives, likelihood of achieving goals, and potential problems during recovery for the procedure that I have provided informed consent. Date   Time: 01/06/2020 11:22 AM  Pre-Procedure Preparation:  Monitoring: As per clinic protocol. Respiration, ETCO2, SpO2, BP, heart rate and rhythm monitor placed and checked for adequate function Safety Precautions: Patient was assessed for positional comfort and pressure points before starting the procedure. Time-out: I initiated and conducted the "Time-out" before starting the procedure, as per protocol. The patient was asked to participate by confirming the accuracy of the "Time Out" information. Verification of the correct person, site, and procedure were performed and confirmed by me, the nursing staff, and the patient. "Time-out" conducted as per Joint Commission's Universal Protocol (UP.01.01.01). Time: 1140  Description of Procedure:          Target Area: Knee Joint Approach: Just above the Medial tibial plateau, lateral to the infrapatellar tendon. Area Prepped: Entire knee area, from the mid-thigh to the mid-shin. DuraPrep (Iodine Povacrylex [0.7% available iodine] and Isopropyl Alcohol, 74% w/w) Safety Precautions: Aspiration looking for blood return was conducted prior to all injections. At no point did we inject any substances, as a needle was being advanced. No attempts were made at seeking any paresthesias. Safe injection practices and needle disposal techniques used. Medications properly checked for expiration dates. SDV (single dose vial) medications used. Description of the Procedure: Protocol guidelines were followed. The patient was placed in position over the fluoroscopy table. The target area was identified and the area prepped in the usual manner. Skin & deeper tissues infiltrated with local anesthetic. Appropriate amount of time allowed to pass for local anesthetics to take effect. The procedure needles were then advanced to the target area. Proper needle placement secured. Negative aspiration confirmed. Solution injected in intermittent fashion, asking for systemic symptoms every  0.5cc of injectate. The needles were then removed and the area cleansed, making sure to leave some of the prepping solution back to take advantage of its long term bactericidal properties. Vitals:   01/06/20 1125  BP: (!) 114/91  Pulse: (!) 113  Resp: 17  Temp: (!) 97.4 F (36.3 Berry)  TempSrc: Temporal  SpO2: 100%  Weight: 165 lb (74.8 kg)  Height: 5\' 2"  (1.575 m)    Start Time: 1140 hrs. End Time: 1145 hrs. Materials:  Needle(s) Type: Regular needle Gauge: 25G Length: 1.5-in Medication(s): Please see orders for medications and dosing details. 2 cc of Hyalgan injected in each knee Imaging Guidance:          Type of Imaging Technique: None used Indication(s): N/A Exposure Time: No patient exposure Contrast: None used. Fluoroscopic Guidance: N/A Ultrasound Guidance: N/A Interpretation: N/A  Antibiotic Prophylaxis:   Anti-infectives (From admission, onward)   None     Indication(s): None identified  Post-operative Assessment:  Post-procedure Vital Signs:  Pulse/HCG Rate: (!) 113  Temp: (!) 97.4 F (36.3 Berry) Resp: 17 BP: (!) 114/91 SpO2: 100 %  EBL: None  Complications: No immediate post-treatment complications observed by team, or reported by patient.  Note: The patient tolerated the entire procedure well. A repeat set of vitals were taken after the procedure and the patient was kept under observation following institutional policy, for this type of procedure. Post-procedural neurological assessment was performed, showing return to baseline, prior to discharge. The patient was provided with post-procedure discharge instructions, including a section on how to identify potential problems. Should any problems arise concerning this procedure, the patient was given instructions to immediately contact , at any time, without hesitation. In any case, we plan to contact the patient by telephone for a follow-up status report regarding this interventional procedure.  Comments:   No additional relevant information.  Plan of Care   Patient had an appointment with Dr. [place], neurosurgeon with at [facility].  He recommended surgery for her but told her that she had to stop smoking prior to proceeding with surgical intervention.   I recommend that patient try to quit smoking and I provided her with resources for smoking cessation.  I did offer the patient intramuscular Norflex and Toradol which she agreed to.  Medications ordered for procedure: Meds ordered this encounter  Medications  . lidocaine (PF) (XYLOCAINE) 1 % injection 5 mL  . Sodium Hyaluronate SOSY 2 mL  . Sodium Hyaluronate SOSY 2 mL  . gabapentin (NEURONTIN) 300 MG capsule    Sig: Take 2 capsules (600 mg total) by mouth 4 (four) times daily.    Dispense:  240 capsule    Refill:  5  . DISCONTD: tiZANidine (ZANAFLEX) 4 MG tablet    Sig: Take 1 tablet (4 mg total) by mouth 3 (three) times daily.    Dispense:  90 tablet    Refill:  5  . tiZANidine (ZANAFLEX) 4 MG tablet    Sig: Take 1 tablet (4 mg total) by mouth every 8 (eight) hours as needed for muscle spasms.    Dispense:  90 tablet    Refill:  2    Do not place this medication, or any other prescription from our practice, on "Automatic Refill". Patient may have prescription filled one day Brenda if pharmacy is closed on scheduled refill date.  [facility] ketorolac (TORADOL) 30 MG/ML injection 30 mg  . orphenadrine (NORFLEX) injection 30 mg   Medications administered: We administered lidocaine (PF), Sodium Hyaluronate, Sodium Hyaluronate, ketorolac, and orphenadrine.  See the medical record for exact dosing, route, and time of administration.  Follow-up plan:   Return in about 4 weeks (around 02/03/2020) for Post Procedure Evaluation, virtual.     Recent Visits Date Type Provider Dept  12/02/19 Procedure visit 12/04/19, MD [facility]  Showing recent visits  within past 90 days and meeting all other requirements Today's Visits Date Type Provider  Dept  01/06/20 Procedure visit Edward Jolly, MD [facility]  Showing today's visits and meeting all other requirements Future Appointments Date Type Provider Dept  01/19/20 Appointment Edward Jolly, MD [facility]  02/02/20 Appointment Edward Jolly, MD [facility]  Showing future appointments within next 90 days and meeting all other requirements  Disposition: Discharge home  Discharge (Date  Time): 01/06/2020; 1204 hrs.   Primary Care Physician: Brenda Limerick, MD Location: [facility] Note by: Edward Jolly, MD Date: 01/06/2020; Time: 1:32 PM  Disclaimer:  Medicine is not an exact science. The only guarantee in medicine is that nothing is guaranteed. It is important to note that the decision to proceed with this intervention was based on the information collected from the patient. The Data and conclusions were drawn from the patient's questionnaire, the interview, and the physical examination. Because the information was provided in large part by the patient, it cannot be guaranteed that it has not been purposely or unconsciously manipulated. Every effort has been made to obtain as much relevant data as possible for this evaluation. It is important to note that the conclusions that lead to this procedure are derived in large part from the available data. Always take into account that the treatment will also be dependent on availability of resources and existing treatment guidelines, considered by other Pain Management Practitioners as being common knowledge and practice, at the time of the intervention. For Medico-Legal purposes, it is also important to point out that variation in procedural techniques and pharmacological choices are the acceptable norm. The indications, contraindications, technique, and results of the above procedure should only be interpreted and judged by a Board-Certified Interventional Pain Specialist with  extensive familiarity and expertise in the same exact procedure and technique.

## 2020-01-19 ENCOUNTER — Other Ambulatory Visit: Payer: Self-pay | Admitting: Obstetrics & Gynecology

## 2020-01-19 ENCOUNTER — Ambulatory Visit
Payer: Medicare Other | Attending: Student in an Organized Health Care Education/Training Program | Admitting: Student in an Organized Health Care Education/Training Program

## 2020-01-19 ENCOUNTER — Other Ambulatory Visit: Payer: Self-pay

## 2020-01-19 ENCOUNTER — Encounter: Payer: Self-pay | Admitting: Student in an Organized Health Care Education/Training Program

## 2020-01-19 VITALS — BP 148/82 | HR 118 | Temp 98.1°F | Resp 18 | Ht 62.0 in | Wt 160.0 lb

## 2020-01-19 DIAGNOSIS — G8929 Other chronic pain: Secondary | ICD-10-CM | POA: Diagnosis not present

## 2020-01-19 DIAGNOSIS — M170 Bilateral primary osteoarthritis of knee: Secondary | ICD-10-CM

## 2020-01-19 DIAGNOSIS — M961 Postlaminectomy syndrome, not elsewhere classified: Secondary | ICD-10-CM

## 2020-01-19 DIAGNOSIS — N951 Menopausal and female climacteric states: Secondary | ICD-10-CM

## 2020-01-19 DIAGNOSIS — M5416 Radiculopathy, lumbar region: Secondary | ICD-10-CM

## 2020-01-19 DIAGNOSIS — G894 Chronic pain syndrome: Secondary | ICD-10-CM | POA: Diagnosis not present

## 2020-01-19 NOTE — Progress Notes (Signed)
PROVIDER NOTE: Information contained herein reflects review and annotations entered in association with encounter. Interpretation of such information and data should be left to medically-trained personnel. Information provided to patient can be located elsewhere in the medical record under "Patient Instructions". Document created using STT-dictation technology, any transcriptional errors that may result from process are unintentional.    Patient: Brenda Berry  Service Category: E/M  Provider: Gillis Santa, MD  DOB: July 05, 1970  DOS: 01/19/2020  Specialty: Interventional Pain Management  MRN: [id number]  Setting: Ambulatory outpatient  PCP: Juline Patch, MD  Type: Established Patient    Referring Provider: Juline Patch, MD  Location: Office  Delivery: Face-to-face     HPI  Reason for encounter: Brenda Berry, a 49 y.o. year old female, is here today for evaluation and management of her Bilateral primary osteoarthritis of knee [M17.0]. [name] primary complain today is Back Pain (lower) Last encounter: Practice (01/06/2020). My last encounter with her was on 01/06/2020. Pertinent problems: [name] has Chronic pain; Failed back surgical syndrome (2002 by Dr. March Rummage); Musculoskeletal pain; Neurogenic pain; Neuropathic pain; Bulging lumbar disc (L3-4, L4-5, and L5-S1); Sacrococcygeal pain (tailbone pain/sacrococcygeal neuralgia); Coccygodynia; Osteoarthrosis; Lumbar facet syndrome (Bilateral) (R>L); Left anterior knee pain; and Neuropathy of both feet on their pertinent problem list. Pain Assessment: Severity of Chronic pain is reported as a 6 /10. Location: Back Lower/both legs, with numbness in toes. Onset: More than a month ago. Quality: Throbbing, Shooting, Burning, Aching. Timing: Constant. Modifying factor(s): heat, recliner, changing positions, massage, medications. Vitals:  height is _0  (1.575 m) and weight is 160 lb (72.6 kg). Her temporal temperature is 98.1 F (36.7 C). Her blood  pressure is 148/82 (abnormal) and her pulse is 118 (abnormal). Her respiration is 18 and oxygen saturation is 99%.   Patient presents today to discuss medication management options for her low back pain and bilateral knee pain.  Unfortunately her intra-articular Hyalgan injections have not been very helpful.  She has had a total of 3.  Prior to this, she attempted a intra-articular knee steroid injection which caused thrush.  She states that she will start PT soon.  She has been offered an L4-L5 decompression versus fusion by Dr. Aris Lot however this would be dependent on her completing at least 6 weeks of PT and stopping smoking.   ROS  Constitutional: Denies any fever or chills Gastrointestinal: No reported hemesis, hematochezia, vomiting, or acute GI distress Musculoskeletal: Low back pain, bilateral leg pain, right greater than left Neurological: Bilateral lower extremity paresthesias  Medication Review  acetaminophen, albuterol, amitriptyline, clotrimazole-betamethasone, estradiol, gabapentin, omeprazole, oxybutynin, and tiZANidine  History Review  Allergy: [name] is allergic to elmiron [pentosan polysulfate], ciprofloxacin, demerol [meperidine], ibuprofen, iodinated diagnostic agents, latex, lodine [etodolac], nifedipine, other, sulfa antibiotics, and terbutaline. Drug: [name]  reports no history of drug use. Alcohol:  reports current alcohol use. Tobacco:  reports that she has been smoking. She has a 13.00 pack-year smoking history. She has never used smokeless tobacco. Social: [name]  reports that she has been smoking. She has a 13.00 pack-year smoking history. She has never used smokeless tobacco. She reports current alcohol use. She reports that she does not use drugs. Medical:  has a past medical history of Arthritis, Chronic neck and back pain, Degenerative disc disease, cervical, Degenerative disc disease, lumbar, GERD (gastroesophageal reflux disease), Hiatal hernia (04/26/2015),  History of bronchitis (04/26/2015), History of hematuria (04/26/2015), History of nephrolithiasis (04/26/2015), History of recurrent UTI (urinary  tract infection) (04/26/2015), Interstitial cystitis, and Status post hysterectomy (04/26/2015). Surgical: [name]  has a past surgical history that includes Back surgery; Vaginal hysterectomy; Bladder surgery; and Esophagogastroduodenoscopy (egd) with propofol (N/A, 01/15/2019). Family: family history includes Diabetes in her mother; Heart disease in her father; Hyperlipidemia in her mother; Hypertension in her mother.  Laboratory Chemistry Profile   Renal Lab Results  Component Value Date   BUN 6 07/25/2015   CREATININE 0.61 07/25/2015   GFRAA >60 07/25/2015   GFRNONAA >60 07/25/2015     Hepatic Lab Results  Component Value Date   AST 21 07/25/2015   ALT 18 07/25/2015   ALBUMIN 4.4 07/25/2015   ALKPHOS 68 07/25/2015     Electrolytes Lab Results  Component Value Date   NA 140 07/25/2015   K 4.1 07/25/2015   CL 107 07/25/2015   CALCIUM 9.3 07/25/2015   MG 2.2 07/25/2015     Bone No results found for: VD25OH, VD125OH2TOT, FS1423TR3, UY2334DH6, 25OHVITD1, 25OHVITD2, 25OHVITD3, TESTOFREE, TESTOSTERONE   Inflammation (CRP: Acute Phase) (ESR: Chronic Phase) Lab Results  Component Value Date   CRP 0.6 07/25/2015   ESRSEDRATE 8 07/25/2015       Note: Above Lab results reviewed.  Recent Imaging Review  MR LUMBAR SPINE WO CONTRAST CLINICAL DATA:  Low back pain and bilateral leg pain. Lumbar radiculopathy.  EXAM: MRI LUMBAR SPINE WITHOUT CONTRAST  TECHNIQUE: Multiplanar, multisequence MR imaging of the lumbar spine was performed. No intravenous contrast was administered.  COMPARISON:  Lumbar MRI dated 05/27/2015  FINDINGS: Segmentation:  5 lumbar type vertebra.  Alignment:  Physiologic.  Vertebrae:  No fracture, evidence of discitis, or bone lesion.  Conus medullaris and cauda equina: Conus extends to the L1-2  level. Conus and cauda equina appear normal.  Paraspinal and other soft tissues: Negative.  Disc levels:  T11-12 through L3-4: Normal and unchanged.  L4-5: The patient has what appears to be a recurrent broad-based soft disc protrusion and extrusion but the patient has had prior surgery at that site with a right laminotomy so some or all of the findings could represent scarring. The soft tissue compresses both lateral recesses which could affect either or both L5 nerves. Neural foramina are widely patent. Thecal sac is compressed slightly.  L5-S1: Normal disc. Minimal degenerative changes of the left facet joint.  IMPRESSION: 1. Possible recurrent soft disc protrusion and extrusion versus scarring at L4-5 centrally and to the left which could affect either or both L5 nerves. 2. The patient has had prior right laminotomy at L4-5. The soft tissue compresses both lateral recesses which could affect either or both L5 nerves. 3. I recommend additional MRI with contrast to help distinguish recurrent disc extrusion and protrusion from scarring.  Electronically Signed   By: Lorriane Shire M.D.   On: 10/13/2019 15:04 Note: Reviewed        Physical Exam  General appearance: alert, cooperative and in mild distress tearful Mental status: Alert, oriented x 3 (person, place, & time)       Respiratory: No evidence of acute respiratory distress Eyes: PERLA Vitals: BP (!) 148/82   Pulse (!) 118   Temp 98.1 F (36.7 C) (Temporal)   Resp 18   Ht _0  (1.575 m)   Wt 160 lb (72.6 kg)   SpO2 99%   BMI 29.26 kg/m  BMI: Estimated body mass index is 29.26 kg/m as calculated from the following:   Height as of this encounter: _1  (1.575 m).  Weight as of this encounter: 160 lb (72.6 kg). Ideal: Ideal body weight: 50.1 kg (110 lb 7.2 oz) Adjusted ideal body weight: 59.1 kg (130 lb 4.3 oz)   Lumbar Spine Area Exam  Skin & Axial Inspection: Well healed scar from previous spine surgery  detected Alignment: Symmetrical Functional ROM: Pain restricted ROM       Stability: No instability detected Muscle Tone/Strength: Functionally intact. No obvious neuro-muscular anomalies detected. Sensory (Neurological): Dermatomal pain pattern  Gait & Posture Assessment  Ambulation: Limited Gait: Antalgic Posture: Difficulty standing up straight, due to pain  Lower Extremity Exam    Side: Right lower extremity  Side: Left lower extremity  Stability: No instability observed          Stability: No instability observed          Skin & Extremity Inspection: Skin color, temperature, and hair growth are WNL. No peripheral edema or cyanosis. No masses, redness, swelling, asymmetry, or associated skin lesions. No contractures.  Skin & Extremity Inspection: Skin color, temperature, and hair growth are WNL. No peripheral edema or cyanosis. No masses, redness, swelling, asymmetry, or associated skin lesions. No contractures.  Functional ROM: Pain restricted ROM for hip and knee joints          Functional ROM: Pain restricted ROM for hip and knee joints          Muscle Tone/Strength: Functionally intact. No obvious neuro-muscular anomalies detected.  Muscle Tone/Strength: Functionally intact. No obvious neuro-muscular anomalies detected.  Sensory (Neurological): Neurogenic pain pattern        Sensory (Neurological): Neurogenic pain pattern        DTR: Patellar: deferred today Achilles: deferred today Plantar: deferred today  DTR: Patellar: deferred today Achilles: deferred today Plantar: deferred today  Palpation: No palpable anomalies  Palpation: No palpable anomalies    Assessment   Status Diagnosis  Persistent Persistent Persistent 1. Bilateral primary osteoarthritis of knee   2. Chronic radicular lumbar pain   3. Chronic pain syndrome   4. Lumbar post-laminectomy syndrome   5. Failed back surgical syndrome      Updated Problems: Problem  Neuropathy of Both Feet  Left Anterior  Knee Pain  Lumbar facet syndrome (Bilateral) (R>L)  Failed back surgical syndrome (2002 by Dr. March Rummage)  Musculoskeletal Pain  Neurogenic Pain  Neuropathic Pain  Bulging lumbar disc (L3-4, L4-5, and L5-S1)  Sacrococcygeal pain (tailbone pain/sacrococcygeal neuralgia)  Coccygodynia  Osteoarthrosis  Chronic Pain    Plan of Care   I had an extensive discussion with the patient regarding treatment plans.  We are fairly limited in options for nonopioid analgesics.  She is currently on gabapentin and does endorse analgesic and functional benefit with this medication.  She has tried Cymbalta in the past which was not effective.  She is already on amitriptyline and wants to avoid dose escalation on this as this medication makes her sleepy.  She would like to be active during the day.  She utilizes Zanaflex as needed for muscle spasms.  At this point, I encouraged the patient to follow through with smoking cessation and physical therapy and then to follow-up with Dr. Aris Lot with [facility].  I did recommend the addition of alpha lipoic acid 600 mg daily that she can take in addition to her gabapentin to see if that will help with her neuropathic pain.  Do not recommend continuing with intra-articular Hyalgan injections as she has already done 3 and they were not beneficial.   Follow-up plan:  Return if symptoms worsen or fail to improve.   Recent Visits Date Type Provider Dept  01/06/20 Procedure visit Gillis Santa, MD [facility]  12/02/19 Procedure visit Gillis Santa, MD [facility]  Showing recent visits within past 90 days and meeting all other requirements Today's Visits Date Type Provider Dept  01/19/20 Office Visit Gillis Santa, MD [facility]  Showing today's visits and meeting all other requirements Future Appointments No visits were found meeting these conditions. Showing future appointments within next 90 days and meeting all other  requirements  I discussed the assessment and treatment plan with the patient. The patient was provided an opportunity to ask questions and all were answered. The patient agreed with the plan and demonstrated an understanding of the instructions.  Patient advised to call back or seek an in-person evaluation if the symptoms or condition worsens.  Duration of encounter: 30 minutes.  Note by: Gillis Santa, MD Date: 01/19/2020; Time: 4:12 PM

## 2020-01-19 NOTE — Patient Instructions (Signed)
1. Consider alpha lipoic acid which you can take 600 mg daily 2. Continue Gabapentin as prescribed

## 2020-01-19 NOTE — Progress Notes (Signed)
Safety precautions to be maintained throughout the outpatient stay will include: orient to surroundings, keep bed in low position, maintain call bell within reach at all times, provide assistance with transfer out of bed and ambulation.  

## 2020-02-02 ENCOUNTER — Telehealth: Payer: Medicare Other | Admitting: Student in an Organized Health Care Education/Training Program

## 2020-02-09 DIAGNOSIS — F17210 Nicotine dependence, cigarettes, uncomplicated: Secondary | ICD-10-CM | POA: Diagnosis not present

## 2020-03-01 DIAGNOSIS — M5416 Radiculopathy, lumbar region: Secondary | ICD-10-CM | POA: Diagnosis not present

## 2020-03-01 DIAGNOSIS — M6281 Muscle weakness (generalized): Secondary | ICD-10-CM | POA: Diagnosis not present

## 2020-06-21 DIAGNOSIS — M5416 Radiculopathy, lumbar region: Secondary | ICD-10-CM | POA: Diagnosis not present

## 2020-06-21 DIAGNOSIS — G8929 Other chronic pain: Secondary | ICD-10-CM | POA: Diagnosis not present

## 2020-06-21 DIAGNOSIS — M170 Bilateral primary osteoarthritis of knee: Secondary | ICD-10-CM | POA: Diagnosis not present

## 2020-06-21 DIAGNOSIS — M961 Postlaminectomy syndrome, not elsewhere classified: Secondary | ICD-10-CM | POA: Diagnosis not present

## 2020-06-24 DIAGNOSIS — G8929 Other chronic pain: Secondary | ICD-10-CM | POA: Diagnosis not present

## 2020-06-24 DIAGNOSIS — M5430 Sciatica, unspecified side: Secondary | ICD-10-CM | POA: Diagnosis not present

## 2020-06-24 DIAGNOSIS — F17210 Nicotine dependence, cigarettes, uncomplicated: Secondary | ICD-10-CM | POA: Diagnosis not present

## 2020-06-24 DIAGNOSIS — M545 Low back pain, unspecified: Secondary | ICD-10-CM | POA: Diagnosis not present

## 2020-06-24 DIAGNOSIS — Z79899 Other long term (current) drug therapy: Secondary | ICD-10-CM | POA: Diagnosis not present

## 2020-07-25 DIAGNOSIS — M5430 Sciatica, unspecified side: Secondary | ICD-10-CM | POA: Diagnosis not present

## 2020-07-25 DIAGNOSIS — Z79899 Other long term (current) drug therapy: Secondary | ICD-10-CM | POA: Diagnosis not present

## 2020-07-25 DIAGNOSIS — F17210 Nicotine dependence, cigarettes, uncomplicated: Secondary | ICD-10-CM | POA: Diagnosis not present

## 2020-07-25 DIAGNOSIS — G8929 Other chronic pain: Secondary | ICD-10-CM | POA: Diagnosis not present

## 2020-07-25 DIAGNOSIS — M545 Low back pain, unspecified: Secondary | ICD-10-CM | POA: Diagnosis not present

## 2020-08-15 ENCOUNTER — Ambulatory Visit: Payer: Self-pay | Admitting: Family Medicine

## 2020-08-20 DIAGNOSIS — M5430 Sciatica, unspecified side: Secondary | ICD-10-CM | POA: Diagnosis not present

## 2020-08-20 DIAGNOSIS — M545 Low back pain, unspecified: Secondary | ICD-10-CM | POA: Diagnosis not present

## 2020-08-20 DIAGNOSIS — F17210 Nicotine dependence, cigarettes, uncomplicated: Secondary | ICD-10-CM | POA: Diagnosis not present

## 2020-08-20 DIAGNOSIS — Z79899 Other long term (current) drug therapy: Secondary | ICD-10-CM | POA: Diagnosis not present

## 2020-08-20 DIAGNOSIS — G8929 Other chronic pain: Secondary | ICD-10-CM | POA: Diagnosis not present

## 2020-09-17 DIAGNOSIS — M545 Low back pain, unspecified: Secondary | ICD-10-CM | POA: Diagnosis not present

## 2020-09-17 DIAGNOSIS — Z79899 Other long term (current) drug therapy: Secondary | ICD-10-CM | POA: Diagnosis not present

## 2020-09-17 DIAGNOSIS — M5430 Sciatica, unspecified side: Secondary | ICD-10-CM | POA: Diagnosis not present

## 2020-09-17 DIAGNOSIS — F17210 Nicotine dependence, cigarettes, uncomplicated: Secondary | ICD-10-CM | POA: Diagnosis not present

## 2020-09-17 DIAGNOSIS — G8929 Other chronic pain: Secondary | ICD-10-CM | POA: Diagnosis not present

## 2020-10-17 DIAGNOSIS — Z79899 Other long term (current) drug therapy: Secondary | ICD-10-CM | POA: Diagnosis not present

## 2020-10-17 DIAGNOSIS — M545 Low back pain, unspecified: Secondary | ICD-10-CM | POA: Diagnosis not present

## 2020-10-17 DIAGNOSIS — M5430 Sciatica, unspecified side: Secondary | ICD-10-CM | POA: Diagnosis not present

## 2020-10-17 DIAGNOSIS — F17210 Nicotine dependence, cigarettes, uncomplicated: Secondary | ICD-10-CM | POA: Diagnosis not present

## 2020-10-17 DIAGNOSIS — G8929 Other chronic pain: Secondary | ICD-10-CM | POA: Diagnosis not present

## 2020-10-25 IMAGING — MR MR LUMBAR SPINE W/O CM
5 series · 31 of 48 positions shown · non-contrast
Comparison: Lumbar MRI dated 05/27/2015

CLINICAL DATA: Low back pain and bilateral leg pain. Lumbar
radiculopathy.

EXAM:
MRI LUMBAR SPINE WITHOUT CONTRAST
TECHNIQUE: Multiplanar, multisequence MR imaging of the lumbar spine was
performed. No intravenous contrast was administered.

[Series 5: T2 · sagittal · 4.0mm · 0.81mm/px · 6 of 17 slices shown (1 of 2)]
[im 1/17]
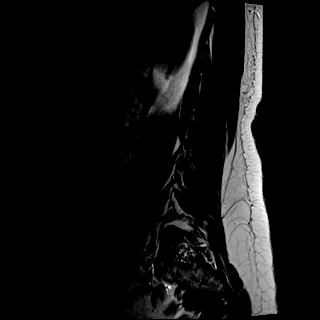
[im 4/17]
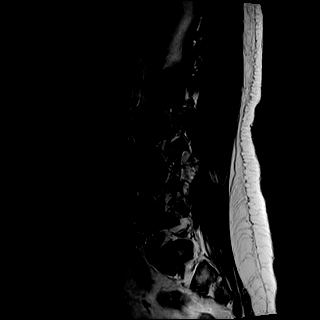
[im 7/17]
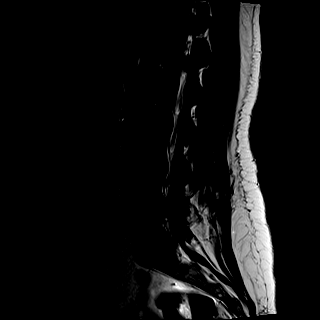
[im 10/17]
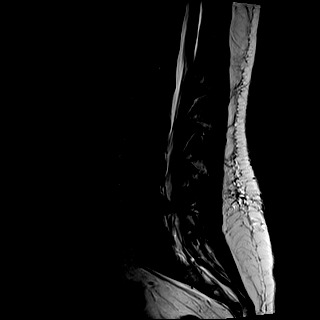
[im 13/17]
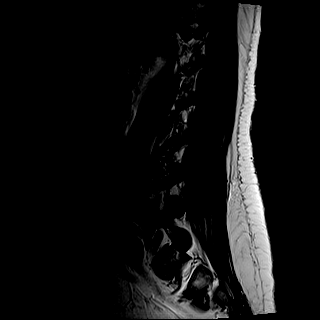
[im 17/17]
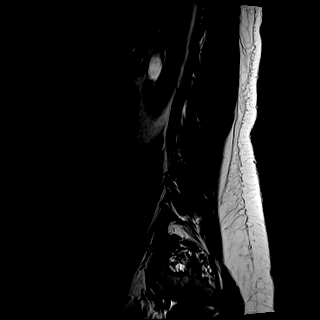

[Series 6: T1 · sagittal · 4.0mm · 0.81mm/px · 7 of 17 slices shown (1 of 2)]
[im 1/17]
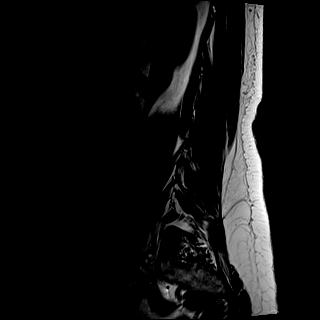
[im 3/17]
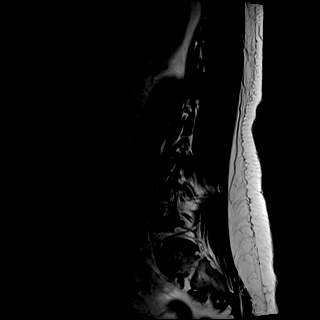
[im 6/17]
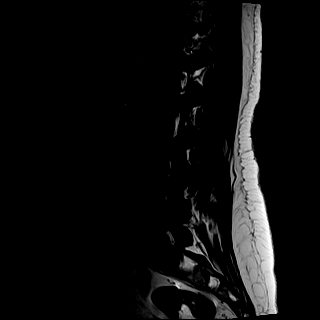
[im 9/17]
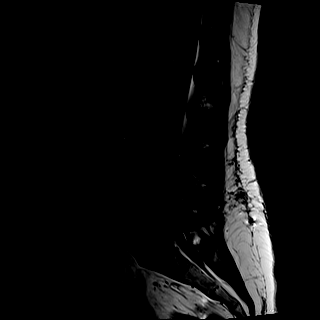
[im 11/17]
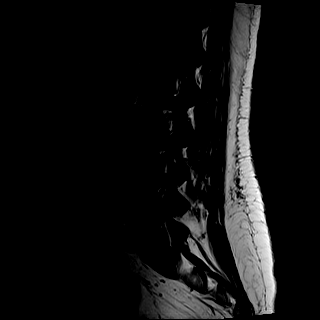
[im 14/17]
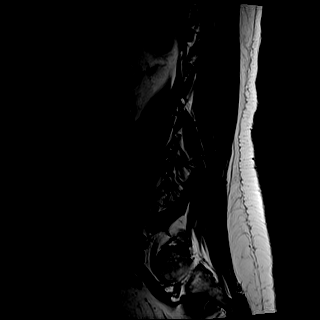
[im 17/17]
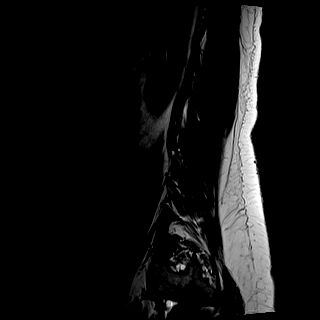

[Series 7: STIR · sagittal · 4.0mm · 0.41mm/px · 2 of 17 slices shown]
[im 1/17]
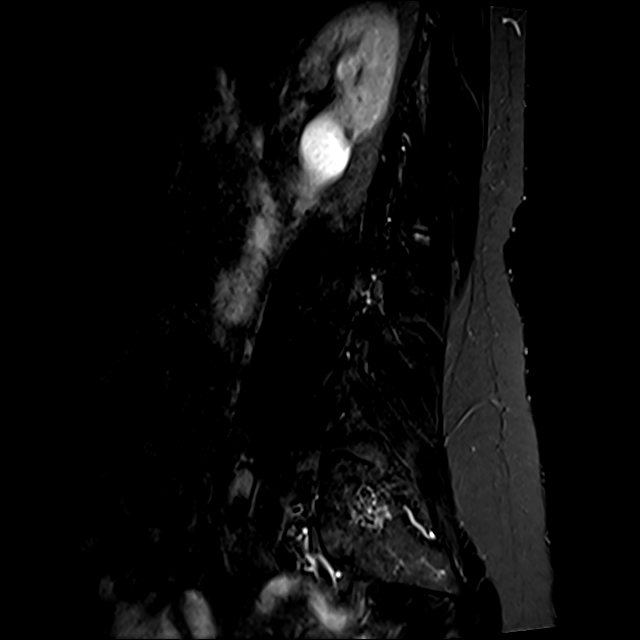
[im 3/17]
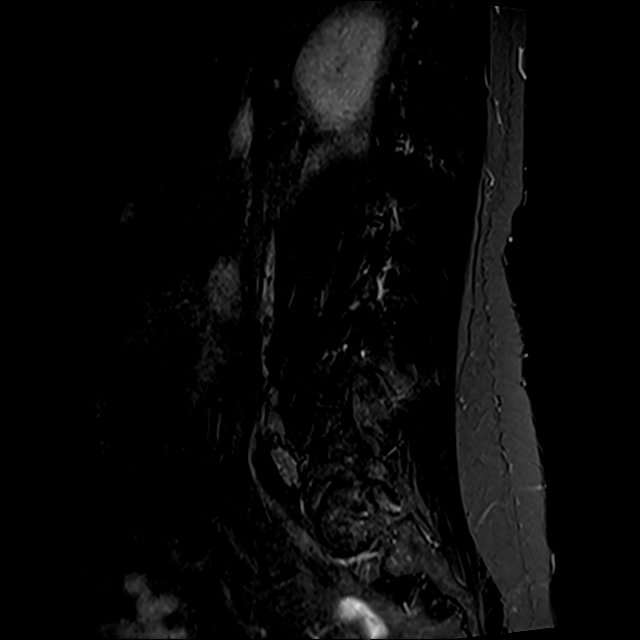

[Series 8: T2 · axial · 4.0mm · 0.78mm/px · z∈[-100,+102]mm · 8 of 36 slices shown (2 of 2)]
[im 1/36]
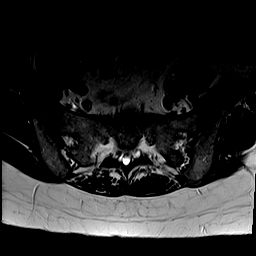
[im 6/36]
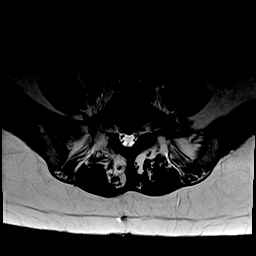
[im 11/36]
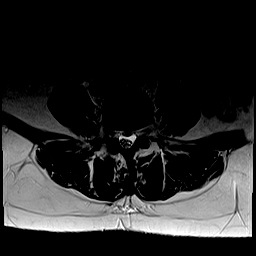
[im 17/36]
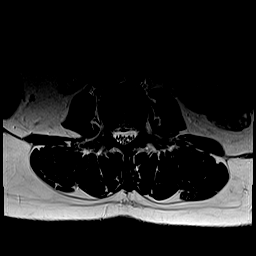
[im 19/36]
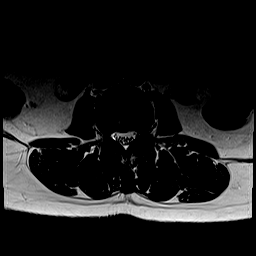
[im 25/36]
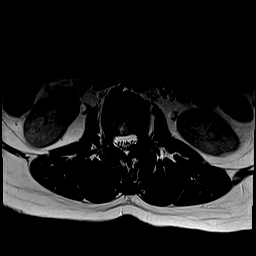
[im 30/36]
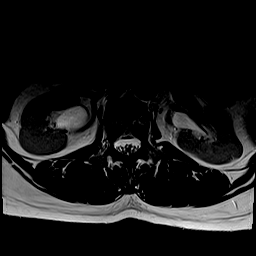
[im 36/36]
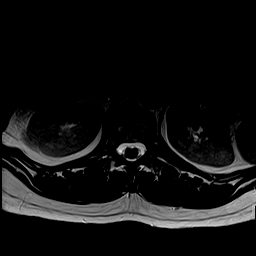

[Series 9: T1 · axial · 4.0mm · 0.39mm/px · z∈[-100,+102]mm · 8 of 36 slices shown (2 of 2)]
[im 1/36]
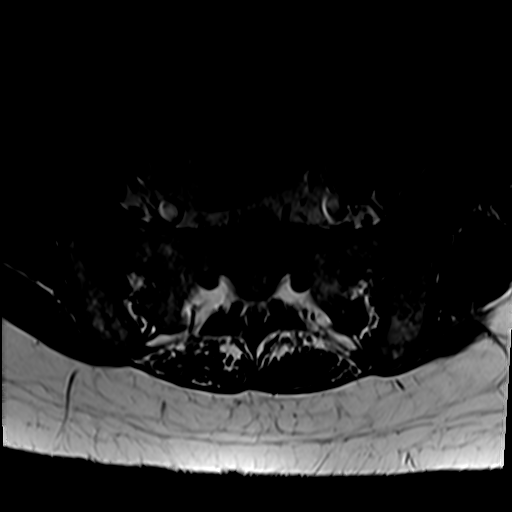
[im 6/36]
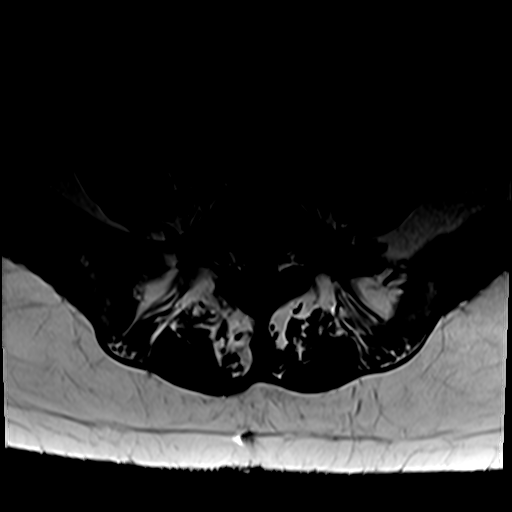
[im 11/36]
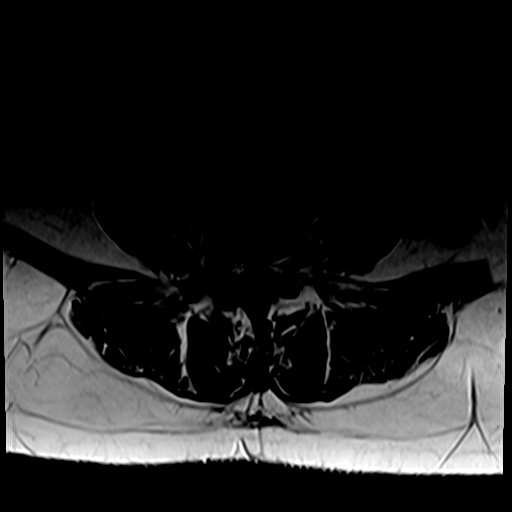
[im 17/36]
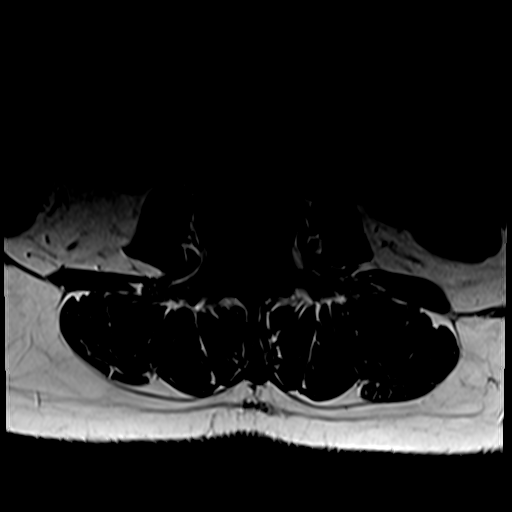
[im 19/36]
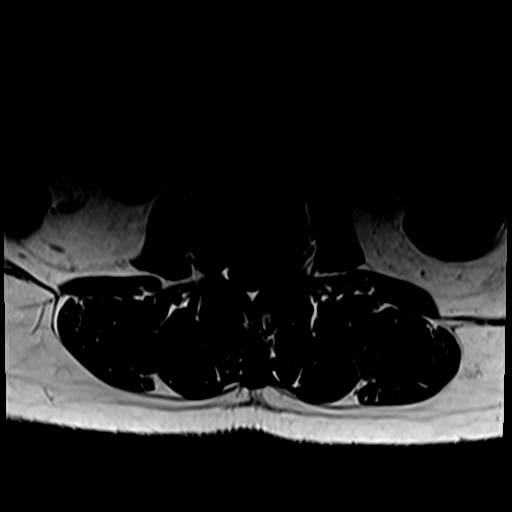
[im 25/36]
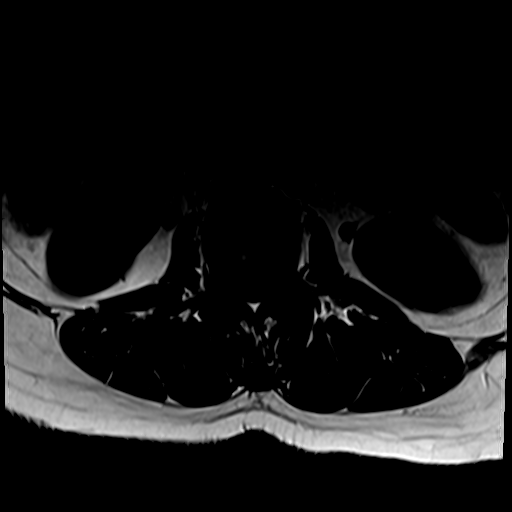
[im 30/36]
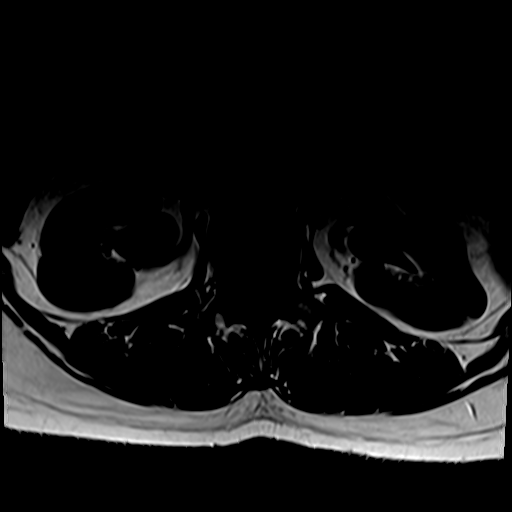
[im 36/36]
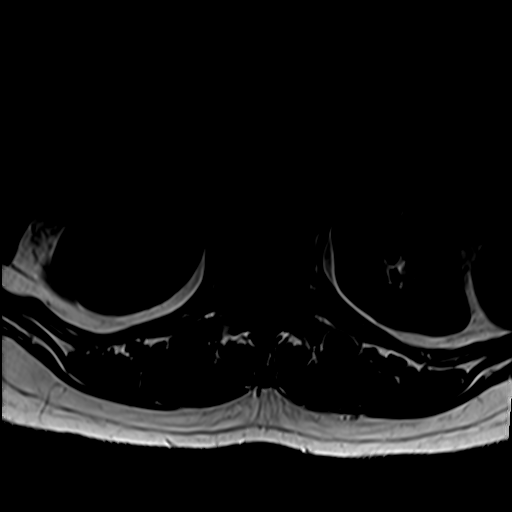

[31 of 48 positions shown; findings below may reference images not displayed]

FINDINGS: Segmentation:  5 lumbar type vertebra.

Alignment:  Physiologic.

Vertebrae:  No fracture, evidence of discitis, or bone lesion.

Conus medullaris and cauda equina: Conus extends to the L1-2 level.
Conus and cauda equina appear normal.

Paraspinal and other soft tissues: Negative.

Disc levels:

T11-12 through L3-4: Normal and unchanged.

L4-5: The patient has what appears to be a recurrent broad-based
soft disc protrusion and extrusion but the patient has had prior
surgery at that site with a right laminotomy so some or all of the
findings could represent scarring. The soft tissue compresses both
lateral recesses which could affect either or both L5 nerves. Neural
foramina are widely patent. Thecal sac is compressed slightly.

L5-S1: Normal disc. Minimal degenerative changes of the left facet
joint.
IMPRESSION: 1. Possible recurrent soft disc protrusion and extrusion versus
scarring at L4-5 centrally and to the left which could affect either
or both L5 nerves.
2. The patient has had prior right laminotomy at L4-5. The soft
tissue compresses both lateral recesses which could affect either or
both L5 nerves.
3. I recommend additional MRI with contrast to help distinguish
recurrent disc extrusion and protrusion from scarring.

## 2020-11-14 DIAGNOSIS — R5383 Other fatigue: Secondary | ICD-10-CM | POA: Diagnosis not present

## 2020-11-14 DIAGNOSIS — Z639 Problem related to primary support group, unspecified: Secondary | ICD-10-CM | POA: Diagnosis not present

## 2020-11-14 DIAGNOSIS — E559 Vitamin D deficiency, unspecified: Secondary | ICD-10-CM | POA: Diagnosis not present

## 2020-11-17 DIAGNOSIS — M5430 Sciatica, unspecified side: Secondary | ICD-10-CM | POA: Diagnosis not present

## 2020-11-17 DIAGNOSIS — M545 Low back pain, unspecified: Secondary | ICD-10-CM | POA: Diagnosis not present

## 2020-11-17 DIAGNOSIS — F17210 Nicotine dependence, cigarettes, uncomplicated: Secondary | ICD-10-CM | POA: Diagnosis not present

## 2020-11-17 DIAGNOSIS — G8929 Other chronic pain: Secondary | ICD-10-CM | POA: Diagnosis not present

## 2020-11-17 DIAGNOSIS — Z79899 Other long term (current) drug therapy: Secondary | ICD-10-CM | POA: Diagnosis not present

## 2020-12-15 DIAGNOSIS — G8929 Other chronic pain: Secondary | ICD-10-CM | POA: Diagnosis not present

## 2020-12-15 DIAGNOSIS — Z79899 Other long term (current) drug therapy: Secondary | ICD-10-CM | POA: Diagnosis not present

## 2020-12-15 DIAGNOSIS — F17210 Nicotine dependence, cigarettes, uncomplicated: Secondary | ICD-10-CM | POA: Diagnosis not present

## 2020-12-15 DIAGNOSIS — M5430 Sciatica, unspecified side: Secondary | ICD-10-CM | POA: Diagnosis not present

## 2020-12-15 DIAGNOSIS — M545 Low back pain, unspecified: Secondary | ICD-10-CM | POA: Diagnosis not present

## 2020-12-17 ENCOUNTER — Other Ambulatory Visit: Payer: Self-pay | Admitting: Obstetrics & Gynecology

## 2020-12-17 DIAGNOSIS — N951 Menopausal and female climacteric states: Secondary | ICD-10-CM

## 2020-12-17 NOTE — Telephone Encounter (Signed)
Pt needs annual scheduled.  One refill called in.

## 2020-12-19 NOTE — Telephone Encounter (Signed)
Called and left voicemail for patient to call back to be scheduled. 

## 2021-01-12 DIAGNOSIS — Z79899 Other long term (current) drug therapy: Secondary | ICD-10-CM | POA: Diagnosis not present

## 2021-01-12 DIAGNOSIS — F17210 Nicotine dependence, cigarettes, uncomplicated: Secondary | ICD-10-CM | POA: Diagnosis not present

## 2021-01-12 DIAGNOSIS — M5430 Sciatica, unspecified side: Secondary | ICD-10-CM | POA: Diagnosis not present

## 2021-01-12 DIAGNOSIS — M545 Low back pain, unspecified: Secondary | ICD-10-CM | POA: Diagnosis not present

## 2021-01-12 DIAGNOSIS — G8929 Other chronic pain: Secondary | ICD-10-CM | POA: Diagnosis not present

## 2021-01-13 ENCOUNTER — Ambulatory Visit: Payer: Self-pay | Admitting: Family Medicine

## 2021-01-20 ENCOUNTER — Emergency Department
Admission: EM | Admit: 2021-01-20 | Discharge: 2021-01-21 | Disposition: A | Discharge status: Home / Self Care | Payer: Medicare Other | Attending: Emergency Medicine | Admitting: Emergency Medicine

## 2021-01-20 ENCOUNTER — Ambulatory Visit (INDEPENDENT_AMBULATORY_CARE_PROVIDER_SITE_OTHER): Payer: Medicare Other

## 2021-01-20 ENCOUNTER — Other Ambulatory Visit: Payer: Self-pay

## 2021-01-20 ENCOUNTER — Emergency Department: Payer: Medicare Other

## 2021-01-20 ENCOUNTER — Ambulatory Visit (INDEPENDENT_AMBULATORY_CARE_PROVIDER_SITE_OTHER)
Admission: EM | Admit: 2021-01-20 | Discharge: 2021-01-20 | Disposition: A | Discharge status: Other Acute Inpatient Hospital | Payer: Medicare Other | Source: Home / Self Care

## 2021-01-20 ENCOUNTER — Encounter: Payer: Self-pay | Admitting: Emergency Medicine

## 2021-01-20 DIAGNOSIS — J449 Chronic obstructive pulmonary disease, unspecified: Secondary | ICD-10-CM | POA: Insufficient documentation

## 2021-01-20 DIAGNOSIS — Z91040 Latex allergy status: Secondary | ICD-10-CM | POA: Insufficient documentation

## 2021-01-20 DIAGNOSIS — R2243 Localized swelling, mass and lump, lower limb, bilateral: Secondary | ICD-10-CM

## 2021-01-20 DIAGNOSIS — R9431 Abnormal electrocardiogram [ECG] [EKG]: Secondary | ICD-10-CM | POA: Diagnosis not present

## 2021-01-20 DIAGNOSIS — R079 Chest pain, unspecified: Secondary | ICD-10-CM | POA: Insufficient documentation

## 2021-01-20 DIAGNOSIS — R7989 Other specified abnormal findings of blood chemistry: Secondary | ICD-10-CM | POA: Diagnosis not present

## 2021-01-20 DIAGNOSIS — F17210 Nicotine dependence, cigarettes, uncomplicated: Secondary | ICD-10-CM | POA: Diagnosis not present

## 2021-01-20 DIAGNOSIS — R0602 Shortness of breath: Secondary | ICD-10-CM | POA: Insufficient documentation

## 2021-01-20 DIAGNOSIS — R059 Cough, unspecified: Secondary | ICD-10-CM | POA: Diagnosis not present

## 2021-01-20 DIAGNOSIS — M7989 Other specified soft tissue disorders: Secondary | ICD-10-CM

## 2021-01-20 LAB — COMPREHENSIVE METABOLIC PANEL
ALT: 19 U/L (ref 0–44)
AST: 23 U/L (ref 15–41)
Albumin: 3.8 g/dL (ref 3.5–5.0)
Alkaline Phosphatase: 74 U/L (ref 38–126)
Anion gap: 8 (ref 5–15)
BUN: 6 mg/dL (ref 6–20)
CO2: 24 mmol/L (ref 22–32)
Calcium: 8.8 mg/dL — ABNORMAL LOW (ref 8.9–10.3)
Chloride: 102 mmol/L (ref 98–111)
Creatinine, Ser: 0.84 mg/dL (ref 0.44–1.00)
GFR, Estimated: >60 mL/min (ref >60)
Glucose, Bld: 104 mg/dL — ABNORMAL HIGH (ref 70–99)
Potassium: 3.4 mmol/L — ABNORMAL LOW (ref 3.5–5.1)
Sodium: 134 mmol/L — ABNORMAL LOW (ref 135–145)
Total Bilirubin: 0.3 mg/dL (ref 0.3–1.2)
Total Protein: 7.1 g/dL (ref 6.5–8.1)

## 2021-01-20 LAB — TROPONIN I (HIGH SENSITIVITY): Troponin I (High Sensitivity): 3 ng/L (ref <18)

## 2021-01-20 LAB — CBC WITH DIFFERENTIAL/PLATELET
Abs Immature Granulocytes: 0.01 10*3/uL (ref 0.00–0.07)
Basophils Absolute: 0.1 10*3/uL (ref 0.0–0.1)
Basophils Relative: 1 %
Eosinophils Absolute: 0.1 10*3/uL (ref 0.0–0.5)
Eosinophils Relative: 2 %
HCT: 37.2 % (ref 36.0–46.0)
Hemoglobin: 12.9 g/dL (ref 12.0–15.0)
Immature Granulocytes: 0 %
Lymphocytes Relative: 52 %
Lymphs Abs: 3.4 10*3/uL (ref 0.7–4.0)
MCH: 31 pg (ref 26.0–34.0)
MCHC: 34.7 g/dL (ref 30.0–36.0)
MCV: 89.4 fL (ref 80.0–100.0)
Monocytes Absolute: 0.4 10*3/uL (ref 0.1–1.0)
Monocytes Relative: 5 %
Neutro Abs: 2.7 10*3/uL (ref 1.7–7.7)
Neutrophils Relative %: 40 %
Platelets: 271 10*3/uL (ref 150–400)
RBC: 4.16 MIL/uL (ref 3.87–5.11)
RDW: 12.4 % (ref 11.5–15.5)
WBC: 6.6 10*3/uL (ref 4.0–10.5)
nRBC: 0 % (ref 0.0–0.2)

## 2021-01-20 LAB — CBC
HCT: 35.9 % — ABNORMAL LOW (ref 36.0–46.0)
Hemoglobin: 12.6 g/dL (ref 12.0–15.0)
MCH: 32.2 pg (ref 26.0–34.0)
MCHC: 35.1 g/dL (ref 30.0–36.0)
MCV: 91.8 fL (ref 80.0–100.0)
Platelets: 252 10*3/uL (ref 150–400)
RBC: 3.91 MIL/uL (ref 3.87–5.11)
RDW: 12.3 % (ref 11.5–15.5)
WBC: 6.5 10*3/uL (ref 4.0–10.5)
nRBC: 0 % (ref 0.0–0.2)

## 2021-01-20 LAB — BRAIN NATRIURETIC PEPTIDE: B Natriuretic Peptide: 94.4 pg/mL (ref 0.0–100.0)

## 2021-01-20 NOTE — Discharge Instructions (Addendum)
As we discussed your blood work does not show any damage to your heart but your EKG has nonspecific ST wave changes that could be the result of pulmonary embolism.  For this reason I recommend you go to the emergency department reviewed have a CT scan of your chest and also ultrasounds of both lower extremities to rule out the presence of a deep vein thrombosis in your legs.

## 2021-01-20 NOTE — ED Provider Notes (Addendum)
[facility]    CSN: [id number] Arrival date & time: 01/20/21  1420      History   Chief Complaint Chief Complaint  Patient presents with   Leg Swelling    bilateral    HPI Brenda Berry is a 50 y.o. female.   HPI  50 year old female here for evaluation of bilateral leg swelling, shortness of breath, and syncope.  Patient reports that she has had shortness of breath for greater than 2 weeks with swelling of both lower extremities for the last 2 weeks.  She has had chest pain that increases with exertion and resolves with rest since the onset of swelling in her legs.  Patient does endorse an episode of syncope where she had an unknown duration loss of consciousness 1 week ago.  Patient states that her family has been upset with her and asked her to be evaluated because at points her lower legs have been swollen up greater than the diameter of her thigh.  She denies any weeping from her legs.  Patient is a pack-a-day smoker and has been doing so for greater than 20 years.  She is also on estrogen at present.  She has not had any long car rides or air travel.  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  Patient reports that the swelling in her lower extremities does improve with elevation but never resolves.  Past Medical History:  Diagnosis Date   Arthritis    Chronic neck and back pain    Degenerative disc disease, cervical    Degenerative disc disease, lumbar    GERD (gastroesophageal reflux disease)    Hiatal hernia 04/26/2015   History of bronchitis 04/26/2015   History of hematuria 04/26/2015   History of nephrolithiasis 04/26/2015   History of recurrent UTI (urinary tract infection) 04/26/2015   Interstitial cystitis    Status post hysterectomy 04/26/2015    Patient Active Problem List   Diagnosis Date Noted   Vasomotor symptoms due to menopause 10/19/2019   Bilateral primary  osteoarthritis of knee 08/06/2019   Tendinitis of thumb 08/05/2019   Neuropathy of both feet 05/05/2019   Left anterior knee pain 02/20/2019   Chronic diarrhea 02/05/2019   Raynaud's phenomenon without gangrene 02/05/2019   Dyspepsia    Dysphagia    RUQ pain    Lumbar facet syndrome (Bilateral) (R>L) 07/25/2015   Chronic neck pain (Location of Secondary source of pain) (Bilateral) (L>R) 07/25/2015   Allergy to contrast media (used for diagnostic x-rays) 04/26/2015   Failed back surgical syndrome (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) 04/26/2015   Epidural fibrosis 04/26/2015   Musculoskeletal pain 04/26/2015   Neurogenic pain 04/26/2015   Neuropathic pain 04/26/2015   Bulging lumbar disc (L3-4, L4-5, and L5-S1) 04/26/2015   Lumbar radiculopathy 04/26/2015   Chronic pelvic pain in female 04/26/2015   History of endometriosis 04/26/2015   Sacrococcygeal pain (tailbone pain/sacrococcygeal neuralgia) 04/26/2015   Coccygodynia 04/26/2015   Herniated cervical disc (C4-5 and C5-6) 04/26/2015   Tobacco abuse 04/26/2015   Chronic obstructive pulmonary disease (COPD) (HCC) 04/26/2015   Urinary incontinence 04/26/2015   History of bronchitis 04/26/2015   Hiatal hernia 04/26/2015   Pain due to interstitial cystitis 04/26/2015   History of recurrent UTI (urinary tract infection) 04/26/2015   History of nephrolithiasis 04/26/2015   History of hematuria 04/26/2015   Osteoarthrosis 04/26/2015   Status post hysterectomy  04/26/2015   Chronic pain 04/25/2015   Long term current use of opiate analgesic 04/25/2015   Long term prescription opiate use 04/25/2015   Opiate use (90 MME/Day) 04/25/2015   Opiate dependence (HCC) 04/25/2015   Encounter for therapeutic drug level monitoring 04/25/2015   Chronic low back pain (Location of Primary Source of Pain) (Bilateral) (L>R) 04/25/2015   Lumbar spondylosis 04/25/2015   Chronic radicular Cervical pain (Bilateral C6 Dermatomal distribution) 04/25/2015   Cervical  spondylosis 04/25/2015   Chronic radicular Lumbar pain (Location of Tertiary source of pain) (Right S1 Dermatomal pain) 04/25/2015   Current tobacco use 01/28/2015   Dyspareunia 02/18/2013   High-tone pelvic floor dysfunction 02/18/2013   Increased frequency of urination 02/18/2013   Nocturia 02/18/2013   Urinary hesitancy 02/18/2013    Past Surgical History:  Procedure Laterality Date   BACK SURGERY     BLADDER SURGERY     ESOPHAGOGASTRODUODENOSCOPY (EGD) WITH PROPOFOL N/A 01/15/2019   Procedure: ESOPHAGOGASTRODUODENOSCOPY (EGD) WITH PROPOFOL;  Surgeon: Toney ReilVanga, Rohini Reddy, MD;  Location: [facility];  Service: Gastroenterology;  Laterality: N/A;   VAGINAL HYSTERECTOMY      OB History     Gravida  3   Para  3   Term  3   Preterm      AB      Living  3      SAB      IAB      Ectopic      Multiple      Live Births               Home Medications    Prior to Admission medications   Medication Sig Start Date End Date Taking? Authorizing Provider  diclofenac Sodium (VOLTAREN) 1 % GEL SMARTSIG:2-4 Gram(s) Topical 1-4 Times Daily 01/12/21  Yes [provider]  estradiol (ESTRACE) 1 MG tablet TAKE 1 TABLET(1 MG) BY MOUTH DAILY 12/17/20  Yes Nadara MustardHarris, Robert P, MD  gabapentin (NEURONTIN) 300 MG capsule Take 2 capsules (600 mg total) by mouth 4 (four) times daily. 01/06/20  Yes Edward JollyLateef, Bilal, MD  hydrOXYzine (ATARAX/VISTARIL) 25 MG tablet Take 25 mg by mouth 2 (two) times daily as needed. 11/14/20  Yes [provider]  oxyCODONE-acetaminophen (PERCOCET/ROXICET) 5-325 MG tablet Take 1 tablet by mouth 4 (four) times daily as needed. 01/14/21  Yes [provider]  sertraline (ZOLOFT) 50 MG tablet Take 25 mg by mouth daily. 11/14/20  Yes [provider]  tiZANidine (ZANAFLEX) 4 MG tablet Take 4 mg by mouth 2 (two) times daily as needed. 01/12/21  Yes [provider]  acetaminophen (TYLENOL) 325 MG tablet Take 650 mg by mouth every 4  (four) hours as needed.  01/31/15   [provider]  albuterol (PROVENTIL HFA;VENTOLIN HFA) 108 (90 Base) MCG/ACT inhaler Inhale 2 puffs into the lungs every 6 (six) hours as needed for wheezing or shortness of breath. 08/12/18   Duanne LimerickJones, Deanna C, MD  amitriptyline (ELAVIL) 25 MG tablet Take 1 tablet (25 mg total) by mouth at bedtime. 02/25/19 12/02/19  Toney ReilVanga, Rohini Reddy, MD  clotrimazole-betamethasone (LOTRISONE) cream Apply 1 application topically 2 (two) times daily. 01/08/19   Duanne LimerickJones, Deanna C, MD  omeprazole (PRILOSEC) 40 MG capsule TAKE ONE CAPSULE BY MOUTH DAILY BEFORE BREAKFAST 05/21/19   Vanga, Loel Dubonnetohini Reddy, MD  oxybutynin (DITROPAN) 5 MG tablet Take 1 tablet by mouth as needed. 01/08/18   [provider]    Family History Family History  Problem Relation Age of  Onset   Diabetes Mother    Hyperlipidemia Mother    Hypertension Mother    Heart disease Father     Social History Social History   Tobacco Use   Smoking status: Every Day    Packs/day: 0.50    Years: 26.00    Pack years: 13.00    Types: Cigarettes   Smokeless tobacco: Never  Vaping Use   Vaping Use: Never used  Substance Use Topics   Alcohol use: Yes    Alcohol/week: 0.0 standard drinks    Comment: occ    Drug use: No     Allergies   Elmiron [pentosan polysulfate], Ciprofloxacin, Demerol [meperidine], Ibuprofen, Iodinated diagnostic agents, Latex, Lodine [etodolac], Nifedipine, Other, Sulfa antibiotics, and Terbutaline   Review of Systems Review of Systems  Constitutional:  Negative for activity change, appetite change and fever.  Respiratory:  Positive for shortness of breath. Negative for cough.   Cardiovascular:  Positive for chest pain and leg swelling.  Skin:  Negative for color change.  Neurological:  Positive for syncope. Negative for weakness.  Hematological: Negative.   Psychiatric/Behavioral: Negative.      Physical Exam Triage Vital Signs ED Triage Vitals  Enc Vitals Group      BP 01/20/21 1435 115/60     Pulse Rate 01/20/21 1435 73     Resp 01/20/21 1435 15     Temp 01/20/21 1435 98.2 F (36.8 C)     Temp Source 01/20/21 1435 Oral     SpO2 01/20/21 1435 100 %     Weight 01/20/21 1431 160 lb 0.9 oz (72.6 kg)     Height 01/20/21 1431 5\' 2"  (1.575 m)     Head Circumference --      Peak Flow --      Pain Score 01/20/21 1431 0     Pain Loc --      Pain Edu? --      Excl. in GC? --    No data found.  Updated Vital Signs BP 115/60 (BP Location: Right Arm)   Pulse 73   Temp 98.2 F (36.8 C) (Oral)   Resp 15   Ht 5\' 2"  (1.575 m)   Wt 160 lb 0.9 oz (72.6 kg)   SpO2 100%   BMI 29.27 kg/m   Visual Acuity Right Eye Distance:   Left Eye Distance:   Bilateral Distance:    Right Eye Near:   Left Eye Near:    Bilateral Near:     Physical Exam Vitals and nursing note reviewed.  Constitutional:      General: She is not in acute distress.    Appearance: Normal appearance. She is normal weight. She is not ill-appearing.  HENT:     Head: Normocephalic and atraumatic.  Neck:     Vascular: No carotid bruit.  Cardiovascular:     Rate and Rhythm: Normal rate and regular rhythm.     Pulses: Normal pulses.     Heart sounds: Normal heart sounds. No murmur heard.   No gallop.  Pulmonary:     Effort: Pulmonary effort is normal.     Breath sounds: Normal breath sounds. No wheezing, rhonchi or rales.  Musculoskeletal:        General: No swelling or tenderness. Normal range of motion.     Cervical back: Normal range of motion and neck supple.     Right lower leg: Edema present.     Left lower leg: Edema present.  Skin:  General: Skin is warm and dry.     Capillary Refill: Capillary refill takes less than 2 seconds.     Findings: No erythema.  Neurological:     General: No focal deficit present.     Mental Status: She is alert and oriented to person, place, and time.  Psychiatric:        Mood and Affect: Mood normal.        Behavior: Behavior  normal.        Thought Content: Thought content normal.        Judgment: Judgment normal.     UC Treatments / Results  Labs (all labs ordered are listed, but only abnormal results are displayed) Labs Reviewed  COMPREHENSIVE METABOLIC PANEL - Abnormal; Notable for the following components:      Result Value   Sodium 134 (*)    Potassium 3.4 (*)    Glucose, Bld 104 (*)    Calcium 8.8 (*)    All other components within normal limits  CBC WITH DIFFERENTIAL/PLATELET  TROPONIN I (HIGH SENSITIVITY)    EKG   Radiology DG Chest 2 View  Result Date: 01/20/2021 CLINICAL DATA:  Shortness of breath and chest pain. Swelling of the lower extremities. EXAM: CHEST - 2 VIEW COMPARISON:  08/12/2018 FINDINGS: Heart size is normal. Mediastinal shadows are normal. The lungs are clear. No bronchial thickening. No infiltrate, mass, effusion or collapse. Pulmonary vascularity is normal. No bony abnormality. IMPRESSION: Normal chest Electronically Signed   By: Paulina Fusi M.D.   On: 01/20/2021 15:33    Procedures Procedures (including critical care time)  Medications Ordered in UC Medications - No data to display  Initial Impression / Assessment and Plan / UC Course  I have reviewed the triage vital signs and the nursing notes.  Pertinent labs & imaging results that were available during my care of the patient were reviewed by me and considered in my medical decision making (see chart for details).  Patient is a pleasant, nontoxic-appearing 50 year old female here for evaluation of swelling in both lower extremities that was preceded by shortness of breath and concurrently arose with chest pain that increases with exertion.  The chest pain is not present all the time.  Patient mitts to 1 episode of syncope last week that she did not have any prior warning for.  She reports that she woke up on the floor and she is unsure what duration she was unconscious.  She denies any weeping from her legs and the  swelling has been worse than it is today.  Patient currently has 2+ pitting edema on the right lower extremity and 1+ pitting edema left lower extremity both to the level of the knees.  Patient is DP and PT pulses are 2+ bilaterally.  Patient does have tenderness with compression of her calf muscles and she describes as a soreness.  She does not have a positive Homans' sign.  Right lower extremity is 42 cm and left lower extremity is 41-1/2 cm in circumference.  Cardiopulmonary exam reveals S1-S2 heart sounds without murmur, rub, or gallop.  Lung sounds are clear to auscultation all fields.  Patient does not have any carotid bruits to auscultation.  Global pulses are 2+.  Differential diagnosis for swelling of her legs and feet are deep vein thrombosis, pulmonary embolism, heart failure, and dependent edema.  Will check CBC, CMP, troponin, EKG, and chest x-ray here in clinic.  EKG shows normal sinus rhythm with a ventricular rate of 65 bpm,  PR interval 140 ms, QRS duration 86 ms, QT 412 ms, QTC 420 ms.  There is ST depression in II, III, aVF's with reciprocal depression in V3, V4, V5, and V6.  Chest x-ray independently reviewed and evaluated by me.  Lung spaces are well-pneumatized and there is no evidence of infiltrate.  Awaiting radiology overread. Radiology interpretation is that the x-ray is normal.  CBC is unremarkable.  CMP shows mild hyponatremia with a sodium of 134, mild hyper bow kalemia with a potassium of 3.4, mild hypocalcemia with a calcium of 8.8.  Remaining values are unremarkable.  Troponin is 3.  Due to patient's nonspecific ST changes coupled with her leg swelling and shortness of breath I would recommend the patient go to the emergency department for evaluation to include chest CT and ultrasound of both legs to rule out the presence of a DVT.  I discussed this with the patient and she has elected to go to [facility] emergency department via privately owned vehicle.  Patient left in stable  condition.   Final Clinical Impressions(s) / UC Diagnoses   Final diagnoses:  Chest pain, unspecified type  Shortness of breath  Leg swelling     Discharge Instructions      As we discussed your blood work does not show any damage to your heart but your EKG has nonspecific ST wave changes that could be the result of pulmonary embolism.  For this reason I recommend you go to the emergency department reviewed have a CT scan of your chest and also ultrasounds of both lower extremities to rule out the presence of a deep vein thrombosis in your legs.     ED Prescriptions   None    PDMP not reviewed this encounter.   Becky Augusta, NP 01/20/21 1618    Becky Augusta, NP 01/21/21 [phone]

## 2021-01-20 NOTE — ED Notes (Signed)
Patient is being discharged from the Urgent Care and sent to the Emergency Department via POV . Per Becky Augusta NP, patient is in need of higher level of care due to current symptoms, test results and need for further evaluation. Patient is aware and verbalizes understanding of plan of care.  Vitals:   01/20/21 1435  BP: 115/60  Pulse: 73  Resp: 15  Temp: 98.2 F (36.8 C)  SpO2: 100%

## 2021-01-20 NOTE — ED Triage Notes (Signed)
Patient c/o bilateral leg and feet swelling that started over 2 weeks ago.  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  Patient reports some SOB.  Patient reports a syncope episode a week ago.  Patient states that she has not been seen by a physician since then.

## 2021-01-20 NOTE — ED Triage Notes (Signed)
Patient reports she was sent here by [facility] for "concerning EKG and pulmonary embolism." Patient unsure of the abnormality on the EKG. Patient complaining of SOB and bilateral foot and leg swelling.

## 2021-01-20 NOTE — ED Notes (Signed)
EKG shown by ED NT.

## 2021-01-21 ENCOUNTER — Emergency Department: Payer: Medicare Other

## 2021-01-21 ENCOUNTER — Encounter: Payer: Self-pay | Admitting: Radiology

## 2021-01-21 DIAGNOSIS — R7989 Other specified abnormal findings of blood chemistry: Secondary | ICD-10-CM | POA: Diagnosis not present

## 2021-01-21 DIAGNOSIS — R0602 Shortness of breath: Secondary | ICD-10-CM | POA: Diagnosis not present

## 2021-01-21 DIAGNOSIS — M7989 Other specified soft tissue disorders: Secondary | ICD-10-CM | POA: Diagnosis not present

## 2021-01-21 LAB — BASIC METABOLIC PANEL
Anion gap: 4 — ABNORMAL LOW (ref 5–15)
BUN: <5 mg/dL — ABNORMAL LOW (ref 6–20)
CO2: 30 mmol/L (ref 22–32)
Calcium: 8.9 mg/dL (ref 8.9–10.3)
Chloride: 105 mmol/L (ref 98–111)
Creatinine, Ser: 0.75 mg/dL (ref 0.44–1.00)
GFR, Estimated: >60 mL/min (ref >60)
Glucose, Bld: 101 mg/dL — ABNORMAL HIGH (ref 70–99)
Potassium: 3.2 mmol/L — ABNORMAL LOW (ref 3.5–5.1)
Sodium: 139 mmol/L (ref 135–145)

## 2021-01-21 LAB — PROTIME-INR
INR: 1.1 (ref 0.8–1.2)
Prothrombin Time: 13.8 seconds (ref 11.4–15.2)

## 2021-01-21 LAB — TROPONIN I (HIGH SENSITIVITY)
Troponin I (High Sensitivity): 2 ng/L (ref <18)
Troponin I (High Sensitivity): 3 ng/L (ref <18)

## 2021-01-21 LAB — APTT: aPTT: 33 seconds (ref 24–36)

## 2021-01-21 LAB — D-DIMER, QUANTITATIVE: D-Dimer, Quant: 2 ug/mL-FEU — ABNORMAL HIGH (ref 0.00–0.50)

## 2021-01-21 MED ORDER — TECHNETIUM TO 99M ALBUMIN AGGREGATED
4.2200 | Freq: Once | INTRAVENOUS | Status: AC | PRN
  Administered 2021-01-21: 4.22 via INTRAVENOUS

## 2021-01-21 MED ORDER — HEPARIN BOLUS VIA INFUSION
5000.0000 [IU] | Freq: Once | INTRAVENOUS | Status: AC
  Administered 2021-01-21: 5000 [IU] via INTRAVENOUS
  Filled 2021-01-21: qty 5000

## 2021-01-21 MED ORDER — FUROSEMIDE 20 MG PO TABS: 20.0000 mg | ORAL_TABLET | Freq: Every day | ORAL | 1 refills | Status: DC

## 2021-01-21 MED ORDER — HEPARIN (PORCINE) 25000 UT/250ML-% IV SOLN
1100.0000 [IU]/h | INTRAVENOUS | Status: DC
  Administered 2021-01-21: 1100 [IU]/h via INTRAVENOUS
  Filled 2021-01-21: qty 250

## 2021-01-21 NOTE — ED Notes (Signed)
Pt in bed, pt back from VQ scan.

## 2021-01-21 NOTE — ED Notes (Signed)
Pt in bed, resps even and unlabored, pt reports some back pain, states that she has chronic back pain, states that she doesn't need anything for her pain at this time, pt only request is some coffee, updated pt on plan of care, pt remains on cardiac monitor in sinus rhythm.

## 2021-01-21 NOTE — ED Provider Notes (Signed)
[facility] Emergency Department Provider Note  ____________________________________________  Time seen: Approximately 12:07 AM  I have reviewed the triage vital signs and the nursing notes.   HISTORY  Chief Complaint Abnormal ECG   HPI Brenda Berry is a 50 y.o. female with a history of arthritis, chronic pain, bronchitis, kidney stones presents for evaluation of shortness of breath.  Patient reports that over the last 2.5 to 3 weeks she has had bilateral leg swelling and shortness of breath.  Initially the shortness of breath was only with exertion but now she is complaining of orthopnea and today has had shortness of breath throughout most of the day.  She went to urgent care and was sent to the emergency room for further evaluation.  She denies any personal or family history of PE or DVT, recent travel immobilization, hemoptysis.  She does take exogenous estrogen.  She denies any chest pain or fever, she denies cough or body aches.  She denies any personal history of heart disease but reports that heart disease runs in her family.  Patient says that she has been taking Lasix at home which she took from a friend after "her friends doctor saw her swollen legs and told her that she needed to be on Lasix."   Past Medical History:  Diagnosis Date   Arthritis    Chronic neck and back pain    Degenerative disc disease, cervical    Degenerative disc disease, lumbar    GERD (gastroesophageal reflux disease)    Hiatal hernia 04/26/2015   History of bronchitis 04/26/2015   History of hematuria 04/26/2015   History of nephrolithiasis 04/26/2015   History of recurrent UTI (urinary tract infection) 04/26/2015   Interstitial cystitis    Status post hysterectomy 04/26/2015    Patient Active Problem List   Diagnosis Date Noted   Vasomotor symptoms due to menopause 10/19/2019   Bilateral primary osteoarthritis of knee 08/06/2019   Tendinitis of thumb 08/05/2019    Neuropathy of both feet 05/05/2019   Left anterior knee pain 02/20/2019   Chronic diarrhea 02/05/2019   Raynaud's phenomenon without gangrene 02/05/2019   Dyspepsia    Dysphagia    RUQ pain    Lumbar facet syndrome (Bilateral) (R>L) 07/25/2015   Chronic neck pain (Location of Secondary source of pain) (Bilateral) (L>R) 07/25/2015   Allergy to contrast media (used for diagnostic x-rays) 04/26/2015   Failed back surgical syndrome (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) 04/26/2015   Epidural fibrosis 04/26/2015   Musculoskeletal pain 04/26/2015   Neurogenic pain 04/26/2015   Neuropathic pain 04/26/2015   Bulging lumbar disc (L3-4, L4-5, and L5-S1) 04/26/2015   Lumbar radiculopathy 04/26/2015   Chronic pelvic pain in female 04/26/2015   History of endometriosis 04/26/2015   Sacrococcygeal pain (tailbone pain/sacrococcygeal neuralgia) 04/26/2015   Coccygodynia 04/26/2015   Herniated cervical disc (C4-5 and C5-6) 04/26/2015   Tobacco abuse 04/26/2015   Chronic obstructive pulmonary disease (COPD) (HCC) 04/26/2015   Urinary incontinence 04/26/2015   History of bronchitis 04/26/2015   Hiatal hernia 04/26/2015   Pain due to interstitial cystitis 04/26/2015   History of recurrent UTI (urinary tract infection) 04/26/2015   History of nephrolithiasis 04/26/2015   History of hematuria 04/26/2015   Osteoarthrosis 04/26/2015   Status post hysterectomy 04/26/2015   Chronic pain 04/25/2015   Long term current use of opiate analgesic 04/25/2015   Long term prescription opiate use 04/25/2015   Opiate use (90 MME/Day) 04/25/2015   Opiate dependence (HCC) 04/25/2015  Encounter for therapeutic drug level monitoring 04/25/2015   Chronic low back pain (Location of Primary Source of Pain) (Bilateral) (L>R) 04/25/2015   Lumbar spondylosis 04/25/2015   Chronic radicular Cervical pain (Bilateral C6 Dermatomal distribution) 04/25/2015   Cervical spondylosis 04/25/2015   Chronic radicular Lumbar pain (Location of Tertiary  source of pain) (Right S1 Dermatomal pain) 04/25/2015   Current tobacco use 01/28/2015   Dyspareunia 02/18/2013   High-tone pelvic floor dysfunction 02/18/2013   Increased frequency of urination 02/18/2013   Nocturia 02/18/2013   Urinary hesitancy 02/18/2013    Past Surgical History:  Procedure Laterality Date   BACK SURGERY     BLADDER SURGERY     ESOPHAGOGASTRODUODENOSCOPY (EGD) WITH PROPOFOL N/A 01/15/2019   Procedure: ESOPHAGOGASTRODUODENOSCOPY (EGD) WITH PROPOFOL;  Surgeon: Toney Reil, MD;  Location: [facility] ENDOSCOPY;  Service: Gastroenterology;  Laterality: N/A;   VAGINAL HYSTERECTOMY      Prior to Admission medications   Medication Sig Start Date End Date Taking? Authorizing Provider  acetaminophen (TYLENOL) 325 MG tablet Take 650 mg by mouth every 4 (four) hours as needed.  01/31/15   [provider]  albuterol (PROVENTIL HFA;VENTOLIN HFA) 108 (90 Base) MCG/ACT inhaler Inhale 2 puffs into the lungs every 6 (six) hours as needed for wheezing or shortness of breath. 08/12/18   Duanne Limerick, MD  diclofenac Sodium (VOLTAREN) 1 % GEL SMARTSIG:2-4 Gram(s) Topical 1-4 Times Daily 01/12/21   [provider]  estradiol (ESTRACE) 1 MG tablet TAKE 1 TABLET(1 MG) BY MOUTH DAILY 12/17/20   Nadara Mustard, MD  gabapentin (NEURONTIN) 300 MG capsule Take 2 capsules (600 mg total) by mouth 4 (four) times daily. 01/06/20   Edward Jolly, MD  hydrOXYzine (ATARAX/VISTARIL) 25 MG tablet Take 25 mg by mouth 2 (two) times daily as needed. 11/14/20   [provider]  oxyCODONE-acetaminophen (PERCOCET/ROXICET) 5-325 MG tablet Take 1 tablet by mouth 4 (four) times daily as needed. 01/14/21   [provider]  sertraline (ZOLOFT) 50 MG tablet Take 25 mg by mouth daily. 11/14/20   [provider]  tiZANidine (ZANAFLEX) 4 MG tablet Take 4 mg by mouth 2 (two) times daily as needed. 01/12/21   [provider]    Allergies Elmiron [pentosan polysulfate],  Ciprofloxacin, Demerol [meperidine], Ibuprofen, Iodinated diagnostic agents, Latex, Lodine [etodolac], Nifedipine, Other, Sulfa antibiotics, and Terbutaline  Family History  Problem Relation Age of Onset   Diabetes Mother    Hyperlipidemia Mother    Hypertension Mother    Heart disease Father     Social History Social History   Tobacco Use   Smoking status: Every Day    Packs/day: 0.50    Years: 26.00    Pack years: 13.00    Types: Cigarettes   Smokeless tobacco: Never  Vaping Use   Vaping Use: Never used  Substance Use Topics   Alcohol use: Yes    Alcohol/week: 0.0 standard drinks    Comment: occ    Drug use: No    Review of Systems  Constitutional: Negative for fever. Eyes: Negative for visual changes. ENT: Negative for sore throat. Neck: No neck pain  Cardiovascular: Negative for chest pain. + orthopnea Respiratory: + shortness of breath. Gastrointestinal: Negative for abdominal pain, vomiting or diarrhea. Genitourinary: Negative for dysuria. Musculoskeletal: Negative for back pain. + b/l leg swelling Skin: Negative for rash. Neurological: Negative for headaches, weakness or numbness. Psych: No SI or HI  ____________________________________________   PHYSICAL EXAM:  VITAL SIGNS: ED Triage Vitals  Enc Vitals Group     BP 01/20/21 1835 123/90     Pulse Rate 01/20/21 1835 82     Resp 01/20/21 1835 16     Temp 01/20/21 1835 98.9 F (37.2 C)     Temp Source 01/20/21 1835 Oral     SpO2 01/20/21 1835 100 %     Weight 01/20/21 1829 158 lb 11.7 oz (72 kg)     Height 01/20/21 1829 5\' 2"  (1.575 m)     Head Circumference --      Peak Flow --      Pain Score 01/20/21 1829 0     Pain Loc --      Pain Edu? --      Excl. in GC? --     Constitutional: Alert and oriented. Well appearing and in no apparent distress. HEENT:      Head: Normocephalic and atraumatic.         Eyes: Conjunctivae are normal. Sclera is non-icteric.       Mouth/Throat: Mucous membranes  are moist.       Neck: Supple with no signs of meningismus. Cardiovascular: Regular rate and rhythm. No murmurs, gallops, or rubs. 2+ symmetrical distal pulses are present in all extremities. No JVD. Respiratory: Normal respiratory effort. Lungs are clear to auscultation bilaterally.  Gastrointestinal: Soft, non tender, and non distended with positive bowel sounds. No rebound or guarding. Genitourinary: No CVA tenderness. Musculoskeletal: Patient has pitting edema of bilateral lower extremities right greater than left  neurologic: Normal speech and language. Face is symmetric. Moving all extremities. No gross focal neurologic deficits are appreciated. Skin: Skin is warm, dry and intact. No rash noted. Psychiatric: Mood and affect are normal. Speech and behavior are normal.  ____________________________________________   LABS (all labs ordered are listed, but only abnormal results are displayed)  Labs Reviewed  CBC - Abnormal; Notable for the following components:      Result Value   HCT 35.9 (*)    All other components within normal limits  BASIC METABOLIC PANEL - Abnormal; Notable for the following components:   Potassium 3.2 (*)    Glucose, Bld 101 (*)    BUN <5 (*)    Anion gap 4 (*)    All other components within normal limits  D-DIMER, QUANTITATIVE - Abnormal; Notable for the following components:   D-Dimer, Quant 2.00 (*)    All other components within normal limits  BRAIN NATRIURETIC PEPTIDE  PROTIME-INR  APTT  HEPARIN LEVEL (UNFRACTIONATED)  TROPONIN I (HIGH SENSITIVITY)  TROPONIN I (HIGH SENSITIVITY)   ____________________________________________  EKG  ED ECG REPORT I, 03/22/21, the attending physician, personally viewed and interpreted this ECG.  Sinus rhythm with a rate of 86, normal intervals, normal axis, T wave inversions in inferior lateral leads with no ST elevations.  Unchanged when compared to prior from urgent care earlier today.  No old for  comparison ____________________________________________  RADIOLOGY  I have personally reviewed the images performed during this visit and I agree with the Radiologist's read.   Interpretation by Radiologist:  DG Chest 2 View  Result Date: 01/20/2021 CLINICAL DATA:  Shortness of breath and chest pain. Swelling of the lower extremities. EXAM: CHEST - 2 VIEW COMPARISON:  08/12/2018 FINDINGS: Heart size is normal. Mediastinal shadows are normal. The lungs are clear. No bronchial thickening. No infiltrate, mass, effusion or collapse. Pulmonary vascularity is normal. No bony abnormality. IMPRESSION: Normal chest Electronically Signed   By: 10/12/2018.D.  On: 01/20/2021 15:33   US Venous Img Lower Bilateral  Result Date: 01/20/2021 CLINICAL DATA:  Swelling EXAM: Bilateral LOWER EXTREMITY VENOUS DOPPLER ULTRASOUND TECHNIQUE: Gray-scale sonography with compression, as well as color and duplex ultrasound, were performed to evaluate the deep venous system(s) from the level of the common femoral vein through the popliteal and proximal calf veins. COMPARISON:  None. FINDINGS: VENOUS Normal compressibility of the common femoral, superficial femoral, and popliteal veins, as well as the visualized calf veins. Visualized portions of profunda femoral vein and great saphenous vein unremarkable. No filling defects to suggest DVT on grayscale or color Doppler imaging. Doppler waveforms show normal direction of venous flow, normal respiratory plasticity and response to augmentation. OTHER None. Limitations: none IMPRESSION: Negative. Electronically Signed   By: Jasmine PangKim  Fujinaga M.D.   On: 01/20/2021 23:56     ____________________________________________   PROCEDURES  Procedure(s) performed:yes .1-3 Lead EKG Interpretation  Date/Time: 01/21/2021 12:29 AM Performed by: Nita SickleVeronese, [place], MD Authorized by: Nita SickleVeronese, [place], MD     Interpretation: non-specific     ECG rate assessment: normal     Rhythm:  sinus rhythm     Ectopy: none     Conduction: normal    Critical Care performed:  None ____________________________________________   INITIAL IMPRESSION / ASSESSMENT AND PLAN / ED COURSE  50 y.o. female with a history of arthritis, chronic pain, bronchitis, kidney stones presents for evaluation of shortness of breath, orthopnea, and b/l leg swelling x3 weeks.  Patient is well-appearing in no distress with normal vital signs, normal work of breathing normal sats, lungs are clear to auscultation.  She does have asymmetric leg swelling with pitting edema in bilateral lower extremities.  Differential diagnosis including congestive heart failure, pulmonary hypertension, COVID, pneumonia.  At this point I believe less likely PE with no tachypnea, tachycardia, hypoxia and bilateral leg swelling.  But since patient is on exogenous hormones we will do a D-dimer and bilateral Doppler studies.  Patient does have allergy to contrast therefore if these results are abnormal we will pursue a VQ scan.  I do believe her presentation is most likely due to new onset of CHF.  I believe that her chest x-ray and BNP are within normal limits because she has been taking Lasix at home.  Her EKG shows T wave inversion inferior lateral leads with no prior for comparison.  First troponin at urgent care is negative.  Blood work with no signs of sepsis or anemia.  Her BNP is negative.  Chest x-ray done at urgent care visualized by me with no signs of congestive heart failure, confirmed by radiology.  Doppler study of bilateral lower extremities is negative for DVT.  Repeat troponin and D-dimer pending.  Patient placed on telemetry for close monitoring of cardiorespiratory status.  Old medical records reviewed.  _________________________ 1:39 AM on 01/21/2021 ----------------------------------------- Patient's D-dimer is elevated but venous doppler of b/l LE is negative for DVT.  Patient has had shortness of breath with IV  contrast.  She does not wish to do another IV contrast study because she is afraid.  Therefore we will keep her in the hospital for a VQ scan in the morning.  In the meantime we will put her on heparin.  If the VQ was positive plan for switching to oral anticoagulation and discharged home since she has no hypoxia and normal troponins and BNP.  Otherwise a VQ scan is negative plan is to send patient home with a prescription for Lasix and referral to  cardiology for an echocardiogram and further evaluation for CHF  _________________________ 7:12 AM on 01/21/2021 ----------------------------------------- Patient awaiting VQ scan.  Can transfer to Dr. Delton Prairie    _____________________________________________ Please note:  Patient was evaluated in Emergency Department today for the symptoms described in the history of present illness. Patient was evaluated in the context of the global COVID-19 pandemic, which necessitated consideration that the patient might be at risk for infection with the SARS-CoV-2 virus that causes COVID-19. Institutional protocols and algorithms that pertain to the evaluation of patients at risk for COVID-19 are in a state of rapid change based on information released by regulatory bodies including the CDC and federal and state organizations. These policies and algorithms were followed during the patient's care in the ED.  Some ED evaluations and interventions may be delayed as a result of limited staffing during the pandemic.   [place] Controlled Substance Database was reviewed by me. ____________________________________________   FINAL CLINICAL IMPRESSION(S) / ED DIAGNOSES   Final diagnoses:  Shortness of breath  Leg swelling      NEW MEDICATIONS STARTED DURING THIS VISIT:  ED Discharge Orders     None        Note:  This document was prepared using Dragon voice recognition software and may include unintentional dictation errors.    Nita Sickle,  MD 01/21/21 [phone]

## 2021-01-21 NOTE — ED Notes (Signed)
Pt sitting up in bed, pt states that she is ready to go home.

## 2021-01-21 NOTE — Progress Notes (Signed)
ANTICOAGULATION CONSULT NOTE   Pharmacy Consult for heparin infusion Indication: pulmonary embolus  Allergies  Allergen Reactions   Elmiron [Pentosan Polysulfate] Nausea Only    Nose bleeds   Ciprofloxacin    Demerol [Meperidine]    Ibuprofen    Iodinated Diagnostic Agents     Reports had SOB with last infusion of dye   Latex    Lodine [Etodolac] Hives   Nifedipine    Other Hives    Nefedipine   Sulfa Antibiotics    Terbutaline     Patient Measurements: Height: 5\' 2"  (157.5 cm) Weight: 72 kg (158 lb 11.7 oz) IBW/kg (Calculated) : 50.1 Heparin Dosing Weight: 65.4 kg  Vital Signs: Temp: 98.9 F (37.2 C) (08/12 1835) Temp Source: Oral (08/12 1835) BP: 137/86 (08/12 2330) Pulse Rate: 68 (08/12 2330)  Labs: Recent Labs    01/20/21 1524 01/20/21 1828 01/20/21 2028 01/20/21 2328  HGB 12.9 12.6  --   --   HCT 37.2 35.9*  --   --   PLT 271 252  --   --   CREATININE 0.84  --   --  0.75  TROPONINIHS 3  --  2 3    Estimated Creatinine Clearance: 80 mL/min (by C-G formula based on SCr of 0.75 mg/dL).   Medical History: Past Medical History:  Diagnosis Date   Arthritis    Chronic neck and back pain    Degenerative disc disease, cervical    Degenerative disc disease, lumbar    GERD (gastroesophageal reflux disease)    Hiatal hernia 04/26/2015   History of bronchitis 04/26/2015   History of hematuria 04/26/2015   History of nephrolithiasis 04/26/2015   History of recurrent UTI (urinary tract infection) 04/26/2015   Interstitial cystitis    Status post hysterectomy 04/26/2015    Assessment: Pt is 50 yo female sent to ED from [facility] for "concerning EKG and pulmonary embolism."   Goal of Therapy:  Heparin level 0.3-0.7 units/ml Monitor platelets by anticoagulation protocol: Yes   Plan:  Give 5000 units bolus x 1 Start heparin infusion at 1100 units/hr Check anti-Xa level in 6 hours and daily while on heparin Continue to monitor H&H and  platelets  [facility], PharmD, [facility] 01/21/2021 1:33 AM

## 2021-01-21 NOTE — ED Notes (Signed)
Pt in nuc med for vq scan

## 2021-01-21 NOTE — Discharge Instructions (Addendum)
No signs of blood clots in the lungs.  No need for blood thinners for you.  You are being discharged with a prescription for Lasix diuretic to take yourself once daily moving forward until you can see a cardiologist in the clinic to fine-tune this dose depending on your response to it, the echocardiogram/ultrasound of your heart.   If you develop any further worsening symptoms despite these measures, please return to the ED.  Otherwise, please call the local cardiologist that is listed below and be seen in the clinic to discuss likely new onset heart failure.

## 2021-01-21 NOTE — ED Provider Notes (Signed)
  Patient received in signout from Dr. Don Perking pending VQ scan to evaluate for acute PE in the setting of her leg swelling and shortness of breath.  This was noted to be negative without evidence of acute PE.  Per original plan of care, we will discharge with prescription for Lasix and referral to cardiology for likely new onset CHF.  No indications for long-term oral anticoagulation.  Return precautions for the ED were discussed.   Delton Prairie, MD 01/21/21 1041

## 2021-01-25 DIAGNOSIS — K219 Gastro-esophageal reflux disease without esophagitis: Secondary | ICD-10-CM | POA: Diagnosis not present

## 2021-01-25 DIAGNOSIS — R600 Localized edema: Secondary | ICD-10-CM | POA: Diagnosis not present

## 2021-01-25 DIAGNOSIS — R000 Tachycardia, unspecified: Secondary | ICD-10-CM | POA: Diagnosis not present

## 2021-01-25 DIAGNOSIS — J449 Chronic obstructive pulmonary disease, unspecified: Secondary | ICD-10-CM | POA: Diagnosis not present

## 2021-01-25 DIAGNOSIS — I509 Heart failure, unspecified: Secondary | ICD-10-CM | POA: Diagnosis not present

## 2021-01-25 DIAGNOSIS — I7300 Raynaud's syndrome without gangrene: Secondary | ICD-10-CM | POA: Diagnosis not present

## 2021-01-25 DIAGNOSIS — I208 Other forms of angina pectoris: Secondary | ICD-10-CM | POA: Diagnosis not present

## 2021-01-25 DIAGNOSIS — F17200 Nicotine dependence, unspecified, uncomplicated: Secondary | ICD-10-CM | POA: Diagnosis not present

## 2021-01-25 DIAGNOSIS — R0602 Shortness of breath: Secondary | ICD-10-CM | POA: Diagnosis not present

## 2021-02-09 DIAGNOSIS — M545 Low back pain, unspecified: Secondary | ICD-10-CM | POA: Diagnosis not present

## 2021-02-09 DIAGNOSIS — Z79899 Other long term (current) drug therapy: Secondary | ICD-10-CM | POA: Diagnosis not present

## 2021-02-09 DIAGNOSIS — M5430 Sciatica, unspecified side: Secondary | ICD-10-CM | POA: Diagnosis not present

## 2021-02-09 DIAGNOSIS — G8929 Other chronic pain: Secondary | ICD-10-CM | POA: Diagnosis not present

## 2021-02-09 DIAGNOSIS — F17210 Nicotine dependence, cigarettes, uncomplicated: Secondary | ICD-10-CM | POA: Diagnosis not present

## 2021-02-19 ENCOUNTER — Observation Stay
Admission: EM | Admit: 2021-02-19 | Discharge: 2021-02-20 | Disposition: A | Discharge status: Home / Self Care | Payer: Medicare Other | Attending: Internal Medicine | Admitting: Internal Medicine

## 2021-02-19 ENCOUNTER — Emergency Department: Payer: Medicare Other

## 2021-02-19 ENCOUNTER — Other Ambulatory Visit: Payer: Self-pay

## 2021-02-19 DIAGNOSIS — I200 Unstable angina: Principal | ICD-10-CM | POA: Insufficient documentation

## 2021-02-19 DIAGNOSIS — I509 Heart failure, unspecified: Secondary | ICD-10-CM | POA: Diagnosis not present

## 2021-02-19 DIAGNOSIS — I110 Hypertensive heart disease with heart failure: Secondary | ICD-10-CM | POA: Insufficient documentation

## 2021-02-19 DIAGNOSIS — M47812 Spondylosis without myelopathy or radiculopathy, cervical region: Secondary | ICD-10-CM | POA: Diagnosis not present

## 2021-02-19 DIAGNOSIS — M170 Bilateral primary osteoarthritis of knee: Secondary | ICD-10-CM | POA: Diagnosis not present

## 2021-02-19 DIAGNOSIS — M5136 Other intervertebral disc degeneration, lumbar region: Secondary | ICD-10-CM | POA: Diagnosis present

## 2021-02-19 DIAGNOSIS — J449 Chronic obstructive pulmonary disease, unspecified: Secondary | ICD-10-CM | POA: Diagnosis present

## 2021-02-19 DIAGNOSIS — R079 Chest pain, unspecified: Secondary | ICD-10-CM | POA: Diagnosis present

## 2021-02-19 DIAGNOSIS — F17210 Nicotine dependence, cigarettes, uncomplicated: Secondary | ICD-10-CM | POA: Diagnosis not present

## 2021-02-19 DIAGNOSIS — M51369 Other intervertebral disc degeneration, lumbar region without mention of lumbar back pain or lower extremity pain: Secondary | ICD-10-CM | POA: Diagnosis present

## 2021-02-19 DIAGNOSIS — R1013 Epigastric pain: Secondary | ICD-10-CM | POA: Diagnosis not present

## 2021-02-19 DIAGNOSIS — Z79899 Other long term (current) drug therapy: Secondary | ICD-10-CM | POA: Insufficient documentation

## 2021-02-19 DIAGNOSIS — J410 Simple chronic bronchitis: Secondary | ICD-10-CM | POA: Diagnosis not present

## 2021-02-19 DIAGNOSIS — R0789 Other chest pain: Secondary | ICD-10-CM | POA: Diagnosis present

## 2021-02-19 DIAGNOSIS — F1120 Opioid dependence, uncomplicated: Secondary | ICD-10-CM | POA: Diagnosis present

## 2021-02-19 DIAGNOSIS — Z91040 Latex allergy status: Secondary | ICD-10-CM | POA: Diagnosis not present

## 2021-02-19 DIAGNOSIS — Z720 Tobacco use: Secondary | ICD-10-CM | POA: Diagnosis not present

## 2021-02-19 DIAGNOSIS — M5126 Other intervertebral disc displacement, lumbar region: Secondary | ICD-10-CM | POA: Diagnosis present

## 2021-02-19 HISTORY — DX: Heart failure, unspecified: I50.9

## 2021-02-19 LAB — COMPREHENSIVE METABOLIC PANEL
ALT: 51 U/L — ABNORMAL HIGH (ref 0–44)
AST: 96 U/L — ABNORMAL HIGH (ref 15–41)
Albumin: 3.7 g/dL (ref 3.5–5.0)
Alkaline Phosphatase: 117 U/L (ref 38–126)
Anion gap: 7 (ref 5–15)
BUN: 9 mg/dL (ref 6–20)
CO2: 25 mmol/L (ref 22–32)
Calcium: 9 mg/dL (ref 8.9–10.3)
Chloride: 106 mmol/L (ref 98–111)
Creatinine, Ser: 0.77 mg/dL (ref 0.44–1.00)
GFR, Estimated: >60 mL/min (ref >60)
Glucose, Bld: 131 mg/dL — ABNORMAL HIGH (ref 70–99)
Potassium: 3.7 mmol/L (ref 3.5–5.1)
Sodium: 138 mmol/L (ref 135–145)
Total Bilirubin: 0.4 mg/dL (ref 0.3–1.2)
Total Protein: 6.7 g/dL (ref 6.5–8.1)

## 2021-02-19 LAB — CBC WITH DIFFERENTIAL/PLATELET
Abs Immature Granulocytes: 0.03 10*3/uL (ref 0.00–0.07)
Basophils Absolute: 0.1 10*3/uL (ref 0.0–0.1)
Basophils Relative: 1 %
Eosinophils Absolute: 0.1 10*3/uL (ref 0.0–0.5)
Eosinophils Relative: 1 %
HCT: 37.6 % (ref 36.0–46.0)
Hemoglobin: 13.2 g/dL (ref 12.0–15.0)
Immature Granulocytes: 0 %
Lymphocytes Relative: 22 %
Lymphs Abs: 1.9 10*3/uL (ref 0.7–4.0)
MCH: 32.1 pg (ref 26.0–34.0)
MCHC: 35.1 g/dL (ref 30.0–36.0)
MCV: 91.5 fL (ref 80.0–100.0)
Monocytes Absolute: 0.4 10*3/uL (ref 0.1–1.0)
Monocytes Relative: 4 %
Neutro Abs: 6.1 10*3/uL (ref 1.7–7.7)
Neutrophils Relative %: 72 %
Platelets: 259 10*3/uL (ref 150–400)
RBC: 4.11 MIL/uL (ref 3.87–5.11)
RDW: 12.2 % (ref 11.5–15.5)
WBC: 8.5 10*3/uL (ref 4.0–10.5)
nRBC: 0 % (ref 0.0–0.2)

## 2021-02-19 LAB — TROPONIN I (HIGH SENSITIVITY)
Troponin I (High Sensitivity): 3 ng/L (ref <18)
Troponin I (High Sensitivity): 8 ng/L (ref <18)

## 2021-02-19 LAB — BRAIN NATRIURETIC PEPTIDE: B Natriuretic Peptide: 37.6 pg/mL (ref 0.0–100.0)

## 2021-02-19 MED ORDER — BUMETANIDE 0.5 MG PO TABS
0.5000 mg | ORAL_TABLET | Freq: Every day | ORAL | Status: DC
  Filled 2021-02-19: qty 1

## 2021-02-19 MED ORDER — NITROGLYCERIN 2 % TD OINT: 0.5000 [in_us] | TOPICAL_OINTMENT | Freq: Four times a day (QID) | TRANSDERMAL | Status: DC | PRN

## 2021-02-19 MED ORDER — HYDROXYZINE HCL 25 MG PO TABS: 25.0000 mg | ORAL_TABLET | Freq: Two times a day (BID) | ORAL | Status: DC | PRN

## 2021-02-19 MED ORDER — HEPARIN SODIUM (PORCINE) 5000 UNIT/ML IJ SOLN
5000.0000 [IU] | Freq: Three times a day (TID) | INTRAMUSCULAR | Status: DC
  Administered 2021-02-19 – 2021-02-20 (×2): 5000 [IU] via SUBCUTANEOUS
  Filled 2021-02-19 (×2): qty 1

## 2021-02-19 MED ORDER — SODIUM CHLORIDE 0.9% FLUSH
3.0000 mL | Freq: Two times a day (BID) | INTRAVENOUS | Status: DC
  Administered 2021-02-19: 3 mL via INTRAVENOUS

## 2021-02-19 MED ORDER — ACETAMINOPHEN 325 MG PO TABS
650.0000 mg | ORAL_TABLET | ORAL | Status: DC | PRN
  Administered 2021-02-20 (×2): 650 mg via ORAL
  Filled 2021-02-19 (×2): qty 2

## 2021-02-19 MED ORDER — SERTRALINE HCL 50 MG PO TABS
25.0000 mg | ORAL_TABLET | Freq: Every day | ORAL | Status: DC
  Administered 2021-02-20: 25 mg via ORAL
  Filled 2021-02-19: qty 1

## 2021-02-19 MED ORDER — METOPROLOL SUCCINATE ER 25 MG PO TB24
12.5000 mg | ORAL_TABLET | Freq: Every day | ORAL | Status: DC
  Administered 2021-02-20: 12.5 mg via ORAL
  Filled 2021-02-19: qty 1

## 2021-02-19 MED ORDER — ONDANSETRON HCL 4 MG/2ML IJ SOLN: 4.0000 mg | Freq: Four times a day (QID) | INTRAMUSCULAR | Status: DC | PRN

## 2021-02-19 MED ORDER — ALBUTEROL SULFATE (2.5 MG/3ML) 0.083% IN NEBU: 2.5000 mg | INHALATION_SOLUTION | Freq: Four times a day (QID) | RESPIRATORY_TRACT | Status: DC | PRN

## 2021-02-19 MED ORDER — GABAPENTIN 300 MG PO CAPS
600.0000 mg | ORAL_CAPSULE | Freq: Four times a day (QID) | ORAL | Status: DC
  Administered 2021-02-19 – 2021-02-20 (×2): 600 mg via ORAL
  Filled 2021-02-19 (×2): qty 2

## 2021-02-19 MED ORDER — SODIUM CHLORIDE 0.9 % IV SOLN: Freq: Once | INTRAVENOUS | Status: DC

## 2021-02-19 MED ORDER — OXYCODONE-ACETAMINOPHEN 5-325 MG PO TABS
1.0000 | ORAL_TABLET | Freq: Four times a day (QID) | ORAL | Status: AC | PRN
  Administered 2021-02-19 – 2021-02-20 (×3): 1 via ORAL
  Filled 2021-02-19 (×2): qty 1

## 2021-02-19 MED ORDER — NITROGLYCERIN 2 % TD OINT
0.5000 [in_us] | TOPICAL_OINTMENT | Freq: Once | TRANSDERMAL | Status: AC
  Administered 2021-02-19: 0.5 [in_us] via TOPICAL
  Filled 2021-02-19: qty 1

## 2021-02-19 NOTE — H&P (Signed)
History and Physical   EMER ONNEN PTW:[id number] DOB: October 04, 1970 DOA: 02/19/2021  PCP: Duanne Limerick, MD  Outpatient Specialists: Dr. Olevia Perches clinic cardiology Patient coming from: Home via EMS  I have personally briefly reviewed patient's old medical records in [facility] EMR.  Chief Concern: Chest pain  HPI: [name] is a 50 y.o. female with medical history significant for chronic pain, cervical spondylosis, multiple back pain, chronic opioid dependence, tobacco abuse, COPD, osteoarthritis of the knees, presents emergency department for chief concerns of chest pain.  She initially noticed bilateral swelling in her legs in July (R > L).  She presented to urgent care center, and bilateral lower extremity ultrasound was done as they were concerned for clots.  This was negative.  She was then referred to cardiology for further evaluation, Dr. Juliann Pares.   She started having chest pain, 3-4 days ago, similar to indigestion, unrelieved with TUMS/GasX. She reports some improvement with those medicatins, however today, it got worse and completely unrelieved with TUMS today and endorsed shortness of breath, neck pain, jaw pain. She reports these symptoms lasted about one hour and was relieved with nitrogylcerina.   She has never had symptoms like sthis before. She states hte location is sternal and epigastric. She denies dysuria, lost of consociousness, syncope, dysphagia, diahrrea. She denies fever, cough, sick contacts.   She states Dr. Juliann Pares started her on lasix and the swelling improved.  Social history: She lives with her son. She is disabled. She smokes 1 ppd, started at age 53. At her peak, she smoked 1.5 ppd. She denies etoh and recreational drug use.   Vaccination history: She is not vaccinated for covid 19  ROS: Constitutional: no weight change, no fever ENT/Mouth: no sore throat, no rhinorrhea Eyes: no eye pain, no vision changes Cardiovascular: + chest  pain, + dyspnea,  no edema, no palpitations Respiratory: no cough, no sputum, no wheezing Gastrointestinal: + nausea, no vomiting, no diarrhea, no constipation Genitourinary: no urinary incontinence, no dysuria, no hematuria Musculoskeletal: no arthralgias, no myalgias Skin: no skin lesions, no pruritus, Neuro: + weakness, no loss of consciousness, no syncope Psych: no anxiety, no depression, + decrease appetite Heme/Lymph: no bruising, no bleeding  ED Course: Discussed with emergency medicine provider, patient requiring hospitalization for chief concerns of chest pain.  Vitals in the emergency department was remarkable for temperature of 99, initial respiration rate of 14, heart rate 80, blood pressure 117/90, SPO2 of 96% on room air.  Labs in the emergency department was remarkable for sodium 138, potassium 3.7, chloride 106, bicarb 25, BUN 9, serum creatinine of 0.77, nonfasting blood glucose 131, GFR greater than 60, WBC 8.5, hemoglobin 13.2, platelets 259.  Per EDP, patient has been complaining of chest pain for several hours with pressure and shortness of breath that does not completely resolve with Tums.  EMS was called and she was given a spray of nitroglycerin which completely resolved her pain.  Patient was evaluated by cardiology, Dr. Juliann Pares, who plans for stress test outpatient however this has not been done.  In the emergency department patient was given nitroglycerin 0.5 inch gel.  Assessment/Plan  Principal Problem:   Chest pain Active Problems:   Opiate dependence (HCC)   Cervical spondylosis   Bulging lumbar disc (L3-4, L4-5, and L5-S1)   Tobacco abuse   Chronic obstructive pulmonary disease (COPD) (HCC)   Bilateral primary osteoarthritis of knee   # Chest pain-resolved with nitroglycerin - Admit to MedSurg, telemetry, observation -  Cardiology has been consulted by emergency medicine provider, who states he will see the patient in the a.m. - Per emergency  medicine provider, cardiologist would like for patient to remain n.p.o. - Nitroglycerin 0.5 inch, topical, every 6 hours as needed for chest pain, 3 doses ordered  # Depression/anxiety-sertraline 25 mg daily resumed, hydroxyzine 25 mg twice daily as needed for anxiety, nausea, itching resumed  # Chronic pain-resumed home Percocet 5-325 mg, 4 times daily as needed for moderate pain, 3 doses ordered  # Hypertension-metoprolol succinate 12.5 mg p.o. daily resumed for 02/20/2021 # Neuropathy-gabapentin 600 mg p.o. 4 times daily  # Preadmission testing - COVID /influenza A/influenza B testing was ordered, patient refused nasal swab - Continue airborne and contact precautions  Chart reviewed.   DVT prophylaxis: Heparin 5000 units subcutaneous every 8 hours Code Status: Full code Diet: Heart healthy now, n.p.o. after midnight except for sips with meds and ice chips Family Communication: No Disposition Plan: Pending clinical course, cardiology evaluation Consults called: Cardiology Admission status: MedSurg, observation,Telemetry  Past Medical History:  Diagnosis Date   Arthritis    CHF (congestive heart failure) (HCC)    Chronic neck and back pain    Degenerative disc disease, cervical    Degenerative disc disease, lumbar    GERD (gastroesophageal reflux disease)    Hiatal hernia 04/26/2015   History of bronchitis 04/26/2015   History of hematuria 04/26/2015   History of nephrolithiasis 04/26/2015   History of recurrent UTI (urinary tract infection) 04/26/2015   Interstitial cystitis    Status post hysterectomy 04/26/2015   Past Surgical History:  Procedure Laterality Date   BACK SURGERY     BLADDER SURGERY     ESOPHAGOGASTRODUODENOSCOPY (EGD) WITH PROPOFOL N/A 01/15/2019   Procedure: ESOPHAGOGASTRODUODENOSCOPY (EGD) WITH PROPOFOL;  Surgeon: Toney Reil, MD;  Location: [facility] ENDOSCOPY;  Service: Gastroenterology;  Laterality: N/A;   VAGINAL HYSTERECTOMY     Social  History:  reports that she has been smoking cigarettes. She has a 13.00 pack-year smoking history. She has never used smokeless tobacco. She reports current alcohol use. She reports that she does not use drugs.  Allergies  Allergen Reactions   Elmiron [Pentosan Polysulfate] Nausea Only    Nose bleeds   Ciprofloxacin    Demerol [Meperidine]    Ibuprofen    Iodinated Diagnostic Agents     Reports had SOB with last infusion of dye   Latex    Lodine [Etodolac] Hives   Nifedipine    Other Hives    Nefedipine   Sulfa Antibiotics    Terbutaline    Family History  Problem Relation Age of Onset   Diabetes Mother    Hyperlipidemia Mother    Hypertension Mother    Heart disease Father    Family history: Family history reviewed and pertinent for heart disease in the father.  Prior to Admission medications   Medication Sig Start Date End Date Taking? Authorizing Provider  acetaminophen (TYLENOL) 325 MG tablet Take 650 mg by mouth every 4 (four) hours as needed.  01/31/15   [provider]  albuterol (PROVENTIL HFA;VENTOLIN HFA) 108 (90 Base) MCG/ACT inhaler Inhale 2 puffs into the lungs every 6 (six) hours as needed for wheezing or shortness of breath. 08/12/18   Duanne Limerick, MD  diclofenac Sodium (VOLTAREN) 1 % GEL SMARTSIG:2-4 Gram(s) Topical 1-4 Times Daily 01/12/21   [provider]  estradiol (ESTRACE) 1 MG tablet TAKE 1 TABLET(1 MG) BY MOUTH DAILY 12/17/20   Tiburcio Pea,  Harrel Lemonobert P, MD  furosemide (LASIX) 20 MG tablet Take 1 tablet (20 mg total) by mouth daily. 01/21/21 01/21/22  Delton PrairieSmith, Dylan, MD  gabapentin (NEURONTIN) 300 MG capsule Take 2 capsules (600 mg total) by mouth 4 (four) times daily. 01/06/20   Edward JollyLateef, Bilal, MD  hydrOXYzine (ATARAX/VISTARIL) 25 MG tablet Take 25 mg by mouth 2 (two) times daily as needed. 11/14/20   [provider]  metoprolol succinate (TOPROL-XL) 25 MG 24 hr tablet Take 12.5 mg by mouth daily. 01/25/21   [provider]   oxyCODONE-acetaminophen (PERCOCET/ROXICET) 5-325 MG tablet Take 1 tablet by mouth 4 (four) times daily as needed. 01/14/21   [provider]  sertraline (ZOLOFT) 50 MG tablet Take 25 mg by mouth daily. 11/14/20   [provider]  tiZANidine (ZANAFLEX) 4 MG tablet Take 4 mg by mouth 2 (two) times daily as needed. 01/12/21   [provider]   Physical Exam: Vitals:   02/19/21 1647 02/19/21 1745 02/19/21 1900 02/19/21 1930  BP:  (!) 109/57 118/74 123/77  Pulse:  84 73 96  Resp:  16 17 (!) 25  Temp:      TempSrc:      SpO2:  94% 97% 97%  Weight: 72.6 kg     Height: 5\' 2"  (1.575 m)      Constitutional: appears older than chronological age, NAD, calm, comfortable Eyes: PERRL, lids and conjunctivae normal ENMT: Mucous membranes are moist. Posterior pharynx clear of any exudate or lesions. Age-appropriate dentition. Hearing appropriate Neck: normal, supple, no masses, no thyromegaly Respiratory: clear to auscultation bilaterally, no wheezing, no crackles. Normal respiratory effort. No accessory muscle use.  Cardiovascular: Regular rate and rhythm, no murmurs / rubs / gallops. No extremity edema. 2+ pedal pulses. No carotid bruits.  Abdomen: no tenderness, no masses palpated, no hepatosplenomegaly. Bowel sounds positive.  Musculoskeletal: no clubbing / cyanosis. No joint deformity upper and lower extremities. Good ROM, no contractures, no atrophy. Normal muscle tone.  Skin: no rashes, lesions, ulcers. No induration Neurologic: Sensation intact. Strength 5/5 in all 4.  Psychiatric: Normal judgment and insight. Alert and oriented x 3. Normal mood.   EKG: independently reviewed, showing sinus rhythm with rate of 69, QTc 445  Chest x-ray on Admission: I personally reviewed and I agree with radiologist reading as below.  DG Chest Portable 1 View  Result Date: 02/19/2021 CLINICAL DATA:  Chest and epigastric pain for 1 day, neck and jaw pain EXAM: PORTABLE CHEST 1 VIEW  COMPARISON:  01/20/2021 FINDINGS: The heart size and mediastinal contours are within normal limits. Both lungs are clear. The visualized skeletal structures are unremarkable. IMPRESSION: No active disease. Electronically Signed   By: Sharlet SalinaMichael  Brown M.D.   On: 02/19/2021 17:30    Labs on Admission: I have personally reviewed following labs  CBC: Recent Labs  Lab 02/19/21 1649  WBC 8.5  NEUTROABS 6.1  HGB 13.2  HCT 37.6  MCV 91.5  PLT 259   Basic Metabolic Panel: Recent Labs  Lab 02/19/21 1649  NA 138  K 3.7  CL 106  CO2 25  GLUCOSE 131*  BUN 9  CREATININE 0.77  CALCIUM 9.0   GFR: Estimated Creatinine Clearance: 80.2 mL/min (by C-G formula based on SCr of 0.77 mg/dL).  Liver Function Tests: Recent Labs  Lab 02/19/21 1649  AST 96*  ALT 51*  ALKPHOS 117  BILITOT 0.4  PROT 6.7  ALBUMIN 3.7   Urine analysis:    Component Value Date/Time   COLORURINE  Yellow 10/28/2011 1227   APPEARANCEUR Clear 10/28/2011 1227   LABSPEC 1.004 10/28/2011 1227   PHURINE 8.0 10/28/2011 1227   GLUCOSEU Negative 10/28/2011 1227   HGBUR 2+ 10/28/2011 1227   BILIRUBINUR Negative 10/28/2011 1227   KETONESUR Negative 10/28/2011 1227   PROTEINUR Negative 10/28/2011 1227   NITRITE Negative 10/28/2011 1227   LEUKOCYTESUR 1+ 10/28/2011 1227   Dr. Sedalia Muta [facility]  If 7PM-7AM, please contact overnight-coverage provider If 7AM-7PM, please contact day coverage provider [email or web address]  02/19/2021, 7:56 PM

## 2021-02-19 NOTE — ED Notes (Signed)
Pt refuses COVID swab, MD aware.

## 2021-02-19 NOTE — ED Notes (Signed)
Pt refused covid swab. °

## 2021-02-19 NOTE — ED Triage Notes (Signed)
Pt arrives to the ED by EMS with c/o of chest pain/epigastric pain that started today. Pt states she normally takes Tums or Mylanta with relief but states she did not get relief today. Pt describes the pain as pressure that went up her neck into her jaw.   EMS gave 324mg  of ASA and 1 nitro and pain was then relived. Pt states some nausea. Pt states HX of CHF. Pt states SOB  and being diaphoretic with this chest pain as well. Pt is A&Ox4. NAD noted at this time.

## 2021-02-19 NOTE — ED Provider Notes (Addendum)
[facility] Emergency Department Provider Note   ____________________________________________   Event Date/Time   First MD Initiated Contact with Patient 02/19/21 1638     (approximate)  I have reviewed the triage vital signs and the nursing notes.   HISTORY  Chief Complaint Chest Pain and Nausea   HPI Early Brenda Berry is a 50 y.o. female who reports several days of epigastric pain radiating up into the chest.  It seemed to improve with Tums but never completely went away with Tums.  Today she had about 1 hour of severe chest pain and pressure and tightness radiating up into the neck and jaw with shortness of breath and sweating and nausea.  She called EMS and they gave her aspirin and nitro and the pain went away almost immediately with the nitro.  She now feels well.  She was here recently and was diagnosed with CHF after having swelling in her legs.  She has an appointment with Dr. Juliann ParesallWood and is scheduled to have a stress test.         Past Medical History:  Diagnosis Date   Arthritis    CHF (congestive heart failure) (HCC)    Chronic neck and back pain    Degenerative disc disease, cervical    Degenerative disc disease, lumbar    GERD (gastroesophageal reflux disease)    Hiatal hernia 04/26/2015   History of bronchitis 04/26/2015   History of hematuria 04/26/2015   History of nephrolithiasis 04/26/2015   History of recurrent UTI (urinary tract infection) 04/26/2015   Interstitial cystitis    Status post hysterectomy 04/26/2015    Patient Active Problem List   Diagnosis Date Noted   Vasomotor symptoms due to menopause 10/19/2019   Bilateral primary osteoarthritis of knee 08/06/2019   Tendinitis of thumb 08/05/2019   Neuropathy of both feet 05/05/2019   Left anterior knee pain 02/20/2019   Chronic diarrhea 02/05/2019   Raynaud's phenomenon without gangrene 02/05/2019   Dyspepsia    Dysphagia    RUQ pain    Lumbar facet syndrome  (Bilateral) (R>L) 07/25/2015   Chronic neck pain (Location of Secondary source of pain) (Bilateral) (L>R) 07/25/2015   Allergy to contrast media (used for diagnostic x-rays) 04/26/2015   Failed back surgical syndrome (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Price) 04/26/2015   Epidural fibrosis 04/26/2015   Musculoskeletal pain 04/26/2015   Neurogenic pain 04/26/2015   Neuropathic pain 04/26/2015   Bulging lumbar disc (L3-4, L4-5, and L5-S1) 04/26/2015   Lumbar radiculopathy 04/26/2015   Chronic pelvic pain in female 04/26/2015   History of endometriosis 04/26/2015   Sacrococcygeal pain (tailbone pain/sacrococcygeal neuralgia) 04/26/2015   Coccygodynia 04/26/2015   Herniated cervical disc (C4-5 and C5-6) 04/26/2015   Tobacco abuse 04/26/2015   Chronic obstructive pulmonary disease (COPD) (HCC) 04/26/2015   Urinary incontinence 04/26/2015   History of bronchitis 04/26/2015   Hiatal hernia 04/26/2015   Pain due to interstitial cystitis 04/26/2015   History of recurrent UTI (urinary tract infection) 04/26/2015   History of nephrolithiasis 04/26/2015   History of hematuria 04/26/2015   Osteoarthrosis 04/26/2015   Status post hysterectomy 04/26/2015   Chronic pain 04/25/2015   Long term current use of opiate analgesic 04/25/2015   Long term prescription opiate use 04/25/2015   Opiate use (90 MME/Day) 04/25/2015   Opiate dependence (HCC) 04/25/2015   Encounter for therapeutic drug level monitoring 04/25/2015   Chronic low back pain (Location of Primary Source of Pain) (Bilateral) (L>R) 04/25/2015   Lumbar spondylosis 04/25/2015  Chronic radicular Cervical pain (Bilateral C6 Dermatomal distribution) 04/25/2015   Cervical spondylosis 04/25/2015   Chronic radicular Lumbar pain (Location of Tertiary source of pain) (Right S1 Dermatomal pain) 04/25/2015   Current tobacco use 01/28/2015   Dyspareunia 02/18/2013   High-tone pelvic floor dysfunction 02/18/2013   Increased frequency of urination 02/18/2013   Nocturia  02/18/2013   Urinary hesitancy 02/18/2013    Past Surgical History:  Procedure Laterality Date   BACK SURGERY     BLADDER SURGERY     ESOPHAGOGASTRODUODENOSCOPY (EGD) WITH PROPOFOL N/A 01/15/2019   Procedure: ESOPHAGOGASTRODUODENOSCOPY (EGD) WITH PROPOFOL;  Surgeon: Toney Reil, MD;  Location: [facility] ENDOSCOPY;  Service: Gastroenterology;  Laterality: N/A;   VAGINAL HYSTERECTOMY      Prior to Admission medications   Medication Sig Start Date End Date Taking? Authorizing Provider  acetaminophen (TYLENOL) 325 MG tablet Take 650 mg by mouth every 4 (four) hours as needed.  01/31/15   [provider]  albuterol (PROVENTIL HFA;VENTOLIN HFA) 108 (90 Base) MCG/ACT inhaler Inhale 2 puffs into the lungs every 6 (six) hours as needed for wheezing or shortness of breath. 08/12/18   Duanne Limerick, MD  diclofenac Sodium (VOLTAREN) 1 % GEL SMARTSIG:2-4 Gram(s) Topical 1-4 Times Daily 01/12/21   [provider]  estradiol (ESTRACE) 1 MG tablet TAKE 1 TABLET(1 MG) BY MOUTH DAILY 12/17/20   Nadara Mustard, MD  furosemide (LASIX) 20 MG tablet Take 1 tablet (20 mg total) by mouth daily. 01/21/21 01/21/22  Delton Prairie, MD  gabapentin (NEURONTIN) 300 MG capsule Take 2 capsules (600 mg total) by mouth 4 (four) times daily. 01/06/20   Edward Jolly, MD  hydrOXYzine (ATARAX/VISTARIL) 25 MG tablet Take 25 mg by mouth 2 (two) times daily as needed. 11/14/20   [provider]  oxyCODONE-acetaminophen (PERCOCET/ROXICET) 5-325 MG tablet Take 1 tablet by mouth 4 (four) times daily as needed. 01/14/21   [provider]  sertraline (ZOLOFT) 50 MG tablet Take 25 mg by mouth daily. 11/14/20   [provider]  tiZANidine (ZANAFLEX) 4 MG tablet Take 4 mg by mouth 2 (two) times daily as needed. 01/12/21   [provider]    Allergies Elmiron [pentosan polysulfate], Ciprofloxacin, Demerol [meperidine], Ibuprofen, Iodinated diagnostic agents, Latex, Lodine [etodolac], Nifedipine,  Other, Sulfa antibiotics, and Terbutaline  Family History  Problem Relation Age of Onset   Diabetes Mother    Hyperlipidemia Mother    Hypertension Mother    Heart disease Father     Social History Social History   Tobacco Use   Smoking status: Every Day    Packs/day: 0.50    Years: 26.00    Pack years: 13.00    Types: Cigarettes   Smokeless tobacco: Never  Vaping Use   Vaping Use: Never used  Substance Use Topics   Alcohol use: Yes    Alcohol/week: 0.0 standard drinks    Comment: occ    Drug use: No    Review of Systems Currently Constitutional: No fever/chills Eyes: No visual changes. ENT: No sore throat. Cardiovascular: Denies chest pain. Respiratory: Denies shortness of breath. Gastrointestinal: No abdominal pain.  No nausea, no vomiting.  No diarrhea.  No constipation. Genitourinary: Negative for dysuria. Musculoskeletal: Negative for back pain. Skin: Negative for rash. Neurological: Negative for headaches, focal weakness  ____________________________________________   PHYSICAL EXAM:  VITAL SIGNS: ED Triage Vitals  Enc Vitals Group     BP 02/19/21 1645 117/90     Pulse Rate 02/19/21 1645 88  Resp 02/19/21 1645 14     Temp 02/19/21 1645 99 F (37.2 C)     Temp Source 02/19/21 1645 Oral     SpO2 02/19/21 1645 96 %     Weight 02/19/21 1647 160 lb (72.6 kg)     Height 02/19/21 1647 5\' 2"  (1.575 m)     Head Circumference --      Peak Flow --      Pain Score 02/19/21 1646 3     Pain Loc --      Pain Edu? --      Excl. in GC? --    Constitutional: Alert and oriented. Well appearing and in no acute distress. Eyes: Conjunctivae are normal.  Head: Atraumatic. Nose: No congestion/rhinnorhea. Mouth/Throat: Mucous membranes are moist.  Oropharynx non-erythematous. Neck: No stridor. Cardiovascular: Normal rate, regular rhythm. Grossly normal heart sounds.  Good peripheral circulation. Respiratory: Normal respiratory effort.  No retractions. Lungs  CTAB. Gastrointestinal: Soft and nontender. No distention. No abdominal bruits. Musculoskeletal: No lower extremity tenderness nor edema.   Neurologic:  Normal speech and language. No gross focal neurologic deficits are appreciated.  Skin:  Skin is warm, dry and intact. No rash noted.   ____________________________________________   LABS (all labs ordered are listed, but only abnormal results are displayed)  Labs Reviewed  COMPREHENSIVE METABOLIC PANEL - Abnormal; Notable for the following components:      Result Value   Glucose, Bld 131 (*)    AST 96 (*)    ALT 51 (*)    All other components within normal limits  RESP PANEL BY RT-PCR (FLU A&B, COVID) ARPGX2  BRAIN NATRIURETIC PEPTIDE  CBC WITH DIFFERENTIAL/PLATELET  TROPONIN I (HIGH SENSITIVITY)  TROPONIN I (HIGH SENSITIVITY)   ____________________________________________  EKG  EKG read interpreted by me shows normal sinus rhythm rate of 90 normal axis some ST segment flattening with slight downsloping inferiorly flipped T waves in V3 and V4 this is about the same as EKGs from previous visits. ___  Cardiac monitor at 730 shows heart rate of 88 sinus rhythm no obvious acute changes _________________________________________  RADIOLOGY 04/21/21, personally viewed and evaluated these images (plain radiographs) as part of my medical decision making, as well as reviewing the written report by the radiologist.  ED MD interpretation: Chest x-ray read by radiology reviewed by me is negative.  Official radiology report(s): DG Chest Portable 1 View  Result Date: 02/19/2021 CLINICAL DATA:  Chest and epigastric pain for 1 day, neck and jaw pain EXAM: PORTABLE CHEST 1 VIEW COMPARISON:  01/20/2021 FINDINGS: The heart size and mediastinal contours are within normal limits. Both lungs are clear. The visualized skeletal structures are unremarkable. IMPRESSION: No active disease. Electronically Signed   By: 03/22/2021 M.D.    On: 02/19/2021 17:30    ____________________________________________   PROCEDURES  Procedure(s) performed (including Critical Care): Critical care time 15 minutes.  This includes reviewing the patient's old records and old EKGs and speaking with cardiologist and a half to talk to the hospitalist as well.  I spoken to the patient twice so far.  Procedures   ____________________________________________   INITIAL IMPRESSION / ASSESSMENT AND PLAN / ED COURSE   Patient's history is very concerning for crescendo angina.  The several days of milder pain partially responsive to Tums but not completely responsive and in the worse episode today with the radiation to the neck and the jaw with shortness of breath nausea and sweating and almost immediately resolution  with nitro.  Patient's troponins are negative EKG is negative.  I discussed the patient with Dr. Juliann Pares who is also concerned and wishes her to come in the hospital possible cath tomorrow. Patient had previous evaluation for PE with similar symptoms which was negative.  Esophageal spasm often takes longer to respond to nitro and patient does not have any other symptoms to suggest that.         ____________________________________________   FINAL CLINICAL IMPRESSION(S) / ED DIAGNOSES  Final diagnoses:  Crescendo angina [facility])     ED Discharge Orders     None        Note:  This document was prepared using Dragon voice recognition software and may include unintentional dictation errors.   Arnaldo Natal, MD 02/19/21 1933 We have been unable to convince the patient to get a coronavirus test.  She does not want anything stuck in her nose.   Arnaldo Natal, MD 02/19/21 [id number]

## 2021-02-20 ENCOUNTER — Encounter
Admission: EM | Disposition: A | Discharge status: Home / Self Care | Payer: Self-pay | Source: Home / Self Care | Attending: Emergency Medicine

## 2021-02-20 ENCOUNTER — Ambulatory Visit: Payer: Medicare Other | Admitting: Family Medicine

## 2021-02-20 DIAGNOSIS — R079 Chest pain, unspecified: Secondary | ICD-10-CM | POA: Diagnosis not present

## 2021-02-20 DIAGNOSIS — J410 Simple chronic bronchitis: Secondary | ICD-10-CM | POA: Diagnosis not present

## 2021-02-20 DIAGNOSIS — F1120 Opioid dependence, uncomplicated: Secondary | ICD-10-CM | POA: Diagnosis not present

## 2021-02-20 DIAGNOSIS — I209 Angina pectoris, unspecified: Secondary | ICD-10-CM | POA: Diagnosis not present

## 2021-02-20 DIAGNOSIS — Z720 Tobacco use: Secondary | ICD-10-CM | POA: Diagnosis not present

## 2021-02-20 HISTORY — PX: LEFT HEART CATH AND CORONARY ANGIOGRAPHY: CATH118249

## 2021-02-20 LAB — BASIC METABOLIC PANEL
Anion gap: 9 (ref 5–15)
BUN: 7 mg/dL (ref 6–20)
CO2: 24 mmol/L (ref 22–32)
Calcium: 9.2 mg/dL (ref 8.9–10.3)
Chloride: 107 mmol/L (ref 98–111)
Creatinine, Ser: 0.58 mg/dL (ref 0.44–1.00)
GFR, Estimated: >60 mL/min (ref >60)
Glucose, Bld: 115 mg/dL — ABNORMAL HIGH (ref 70–99)
Potassium: 3.8 mmol/L (ref 3.5–5.1)
Sodium: 140 mmol/L (ref 135–145)

## 2021-02-20 LAB — CBC
HCT: 37.3 % (ref 36.0–46.0)
Hemoglobin: 13.1 g/dL (ref 12.0–15.0)
MCH: 31.8 pg (ref 26.0–34.0)
MCHC: 35.1 g/dL (ref 30.0–36.0)
MCV: 90.5 fL (ref 80.0–100.0)
Platelets: 252 10*3/uL (ref 150–400)
RBC: 4.12 MIL/uL (ref 3.87–5.11)
RDW: 12.2 % (ref 11.5–15.5)
WBC: 9.4 10*3/uL (ref 4.0–10.5)
nRBC: 0 % (ref 0.0–0.2)

## 2021-02-20 LAB — LIPID PANEL
Cholesterol: 162 mg/dL (ref 0–200)
HDL: 68 mg/dL (ref >40)
LDL Cholesterol: 72 mg/dL (ref 0–99)
Total CHOL/HDL Ratio: 2.4 RATIO
Triglycerides: 109 mg/dL (ref <150)
VLDL: 22 mg/dL (ref 0–40)

## 2021-02-20 LAB — HIV ANTIBODY (ROUTINE TESTING W REFLEX): HIV Screen 4th Generation wRfx: NONREACTIVE

## 2021-02-20 SURGERY — LEFT HEART CATH AND CORONARY ANGIOGRAPHY
Anesthesia: Moderate Sedation

## 2021-02-20 MED ORDER — ROPINIROLE HCL ER 4 MG PO TB24: 4.0000 mg | ORAL_TABLET | Freq: Every day | ORAL | Status: DC

## 2021-02-20 MED ORDER — SODIUM CHLORIDE 0.9 % IV SOLN
250.0000 mL | INTRAVENOUS | Status: DC | PRN
Start: 2021-02-20 — End: 2021-02-20

## 2021-02-20 MED ORDER — IOHEXOL 350 MG/ML SOLN
INTRAVENOUS | Status: DC | PRN
  Administered 2021-02-20: 50 mL

## 2021-02-20 MED ORDER — HEPARIN (PORCINE) IN NACL 2000-0.9 UNIT/L-% IV SOLN
INTRAVENOUS | Status: DC | PRN
  Administered 2021-02-20: 1000 mL

## 2021-02-20 MED ORDER — HEPARIN SODIUM (PORCINE) 1000 UNIT/ML IJ SOLN
INTRAMUSCULAR | Status: AC
  Filled 2021-02-20: qty 1

## 2021-02-20 MED ORDER — FAMOTIDINE 20 MG PO TABS: 40.0000 mg | ORAL_TABLET | Freq: Once | ORAL | Status: AC

## 2021-02-20 MED ORDER — SODIUM CHLORIDE 0.9% FLUSH
3.0000 mL | Freq: Two times a day (BID) | INTRAVENOUS | Status: DC
  Administered 2021-02-20: 3 mL via INTRAVENOUS

## 2021-02-20 MED ORDER — VERAPAMIL HCL 2.5 MG/ML IV SOLN
INTRAVENOUS | Status: AC
  Filled 2021-02-20: qty 2

## 2021-02-20 MED ORDER — METHYLPREDNISOLONE SODIUM SUCC 125 MG IJ SOLR: 125.0000 mg | Freq: Once | INTRAMUSCULAR | Status: AC

## 2021-02-20 MED ORDER — DIPHENHYDRAMINE HCL 50 MG/ML IJ SOLN: 50.0000 mg | Freq: Once | INTRAMUSCULAR | Status: AC

## 2021-02-20 MED ORDER — FENTANYL CITRATE PF 50 MCG/ML IJ SOSY
PREFILLED_SYRINGE | INTRAMUSCULAR | Status: AC
  Filled 2021-02-20: qty 1

## 2021-02-20 MED ORDER — VERAPAMIL HCL 2.5 MG/ML IV SOLN
INTRAVENOUS | Status: DC | PRN
  Administered 2021-02-20: 2.5 mg via INTRA_ARTERIAL

## 2021-02-20 MED ORDER — OXYCODONE-ACETAMINOPHEN 5-325 MG PO TABS
ORAL_TABLET | ORAL | Status: AC
  Filled 2021-02-20: qty 1

## 2021-02-20 MED ORDER — FENTANYL CITRATE (PF) 100 MCG/2ML IJ SOLN
INTRAMUSCULAR | Status: DC | PRN
  Administered 2021-02-20 (×3): 25 ug via INTRAVENOUS

## 2021-02-20 MED ORDER — MIDAZOLAM HCL 2 MG/2ML IJ SOLN
INTRAMUSCULAR | Status: DC | PRN
  Administered 2021-02-20 (×2): .5 mg via INTRAVENOUS
  Administered 2021-02-20: 1 mg via INTRAVENOUS

## 2021-02-20 MED ORDER — HEPARIN (PORCINE) IN NACL 1000-0.9 UT/500ML-% IV SOLN
INTRAVENOUS | Status: AC
  Filled 2021-02-20: qty 1000

## 2021-02-20 MED ORDER — ASPIRIN 81 MG PO CHEW
CHEWABLE_TABLET | ORAL | Status: AC
  Administered 2021-02-20: 81 mg via ORAL
  Filled 2021-02-20: qty 1

## 2021-02-20 MED ORDER — DIPHENHYDRAMINE HCL 50 MG/ML IJ SOLN
INTRAMUSCULAR | Status: AC
  Administered 2021-02-20: 50 mg via INTRAVENOUS
  Filled 2021-02-20: qty 1

## 2021-02-20 MED ORDER — MIDAZOLAM HCL 2 MG/2ML IJ SOLN
INTRAMUSCULAR | Status: AC
  Filled 2021-02-20: qty 2

## 2021-02-20 MED ORDER — ROPINIROLE HCL ER 4 MG PO TB24: 4.0000 mg | ORAL_TABLET | Freq: Every day | ORAL | 0 refills | Status: DC

## 2021-02-20 MED ORDER — ASPIRIN 81 MG PO CHEW: 81.0000 mg | CHEWABLE_TABLET | ORAL | Status: AC

## 2021-02-20 MED ORDER — SODIUM CHLORIDE 0.9 % IV SOLN: 250.0000 mL | INTRAVENOUS | Status: DC | PRN

## 2021-02-20 MED ORDER — SODIUM CHLORIDE 0.9 % WEIGHT BASED INFUSION
1.0000 mL/kg/h | INTRAVENOUS | Status: DC
  Administered 2021-02-20: 1 mL/kg/h via INTRAVENOUS

## 2021-02-20 MED ORDER — FAMOTIDINE 20 MG PO TABS
ORAL_TABLET | ORAL | Status: AC
  Administered 2021-02-20: 40 mg via ORAL
  Filled 2021-02-20: qty 2

## 2021-02-20 MED ORDER — HEPARIN SODIUM (PORCINE) 1000 UNIT/ML IJ SOLN
INTRAMUSCULAR | Status: DC | PRN
  Administered 2021-02-20: 4000 [IU] via INTRAVENOUS

## 2021-02-20 MED ORDER — SODIUM CHLORIDE 0.9 % WEIGHT BASED INFUSION
3.0000 mL/kg/h | INTRAVENOUS | Status: DC
  Administered 2021-02-20: 3 mL/kg/h via INTRAVENOUS

## 2021-02-20 MED ORDER — LIDOCAINE HCL 1 % IJ SOLN
INTRAMUSCULAR | Status: AC
  Filled 2021-02-20: qty 20

## 2021-02-20 MED ORDER — SODIUM CHLORIDE 0.9% FLUSH: 3.0000 mL | INTRAVENOUS | Status: DC | PRN

## 2021-02-20 MED ORDER — LIDOCAINE HCL (PF) 1 % IJ SOLN
INTRAMUSCULAR | Status: DC | PRN
  Administered 2021-02-20: 2 mL

## 2021-02-20 MED ORDER — METHYLPREDNISOLONE SODIUM SUCC 125 MG IJ SOLR
INTRAMUSCULAR | Status: AC
  Administered 2021-02-20: 125 mg via INTRAVENOUS
  Filled 2021-02-20: qty 2

## 2021-02-20 MED ORDER — SODIUM CHLORIDE 0.9 % IV SOLN: INTRAVENOUS | Status: AC

## 2021-02-20 SURGICAL SUPPLY — 11 items

## 2021-02-20 NOTE — Progress Notes (Signed)
Pt c/o "jumpy" and "creepy, crawly" legs, requesting tx for such. MD made aware no new orders

## 2021-02-20 NOTE — Consult Note (Signed)
CARDIOLOGY CONSULT NOTE               Patient ID: Brenda Berry MRN: [id number] DOB/AGE: Apr 10, 1971 50 y.o.  Admit date: 02/19/2021 Referring Physician Dr. Londell Moh hospitalist Primary Physician Dr. Elizabeth Sauer primary Primary Cardiologist [facility] Reason for Consultation unstable angina chest pain  HPI: Patient is a 50 year old female history of chronic pain chest pain reflux COPD tobacco abuse presented with worsening chest discomfort midsternal relieved by nitroglycerin she taken multiple GI related Tums and Gas-X without relief of arrival emergency room via rescue EKG was nondiagnostic.  Patient appeared to rule out microinfarction where EKG and enzymes and cardiology was then consulted for further management.  The patient been seen as an outpatient and was undergoing noninvasive outpatient evaluation when she had recurrent acceleration of anginal symptoms and went to urgent care and subsequently came to the emergency room.  Patient is currently pain-free during my interview  Review of systems complete and found to be negative unless listed above     Past Medical History:  Diagnosis Date   Arthritis    CHF (congestive heart failure) (HCC)    Chronic neck and back pain    Degenerative disc disease, cervical    Degenerative disc disease, lumbar    GERD (gastroesophageal reflux disease)    Hiatal hernia 04/26/2015   History of bronchitis 04/26/2015   History of hematuria 04/26/2015   History of nephrolithiasis 04/26/2015   History of recurrent UTI (urinary tract infection) 04/26/2015   Interstitial cystitis    Status post hysterectomy 04/26/2015    Past Surgical History:  Procedure Laterality Date   BACK SURGERY     BLADDER SURGERY     ESOPHAGOGASTRODUODENOSCOPY (EGD) WITH PROPOFOL N/A 01/15/2019   Procedure: ESOPHAGOGASTRODUODENOSCOPY (EGD) WITH PROPOFOL;  Surgeon: Toney Reil, MD;  Location: [facility] ENDOSCOPY;  Service: Gastroenterology;  Laterality: N/A;    VAGINAL HYSTERECTOMY      Medications Prior to Admission  Medication Sig Dispense Refill Last Dose   acetaminophen (TYLENOL) 325 MG tablet Take 650 mg by mouth every 4 (four) hours as needed.    prn at prn   albuterol (PROVENTIL HFA;VENTOLIN HFA) 108 (90 Base) MCG/ACT inhaler Inhale 2 puffs into the lungs every 6 (six) hours as needed for wheezing or shortness of breath. 1 Inhaler 2 prn at prn   diclofenac Sodium (VOLTAREN) 1 % GEL SMARTSIG:2-4 Gram(s) Topical 1-4 Times Daily   02/19/2021   estradiol (ESTRACE) 1 MG tablet TAKE 1 TABLET(1 MG) BY MOUTH DAILY 60 tablet 0 02/19/2021   gabapentin (NEURONTIN) 300 MG capsule Take 2 capsules (600 mg total) by mouth 4 (four) times daily. 240 capsule 5 02/19/2021   hydrOXYzine (ATARAX/VISTARIL) 25 MG tablet Take 25 mg by mouth 2 (two) times daily as needed.   prn at prn   metoprolol succinate (TOPROL-XL) 25 MG 24 hr tablet Take 12.5 mg by mouth daily.   02/19/2021   oxyCODONE-acetaminophen (PERCOCET/ROXICET) 5-325 MG tablet Take 1 tablet by mouth 4 (four) times daily as needed.   prn at prn   sertraline (ZOLOFT) 50 MG tablet Take 25 mg by mouth daily.   02/19/2021   tiZANidine (ZANAFLEX) 4 MG tablet Take 4 mg by mouth 2 (two) times daily as needed.   02/19/2021   bumetanide (BUMEX) 0.5 MG tablet Take 1 tablet by mouth daily.      furosemide (LASIX) 20 MG tablet Take 1 tablet (20 mg total) by mouth daily. (Patient not taking: No sig reported) 30  tablet 1 Not Taking   Social History   Socioeconomic History   Marital status: Divorced    Spouse name: Not on file   Number of children: Not on file   Years of education: Not on file   Highest education level: Not on file  Occupational History   Occupation: disabled  Tobacco Use   Smoking status: Every Day    Packs/day: 0.50    Years: 26.00    Pack years: 13.00    Types: Cigarettes   Smokeless tobacco: Never  Vaping Use   Vaping Use: Never used  Substance and Sexual Activity   Alcohol use: Yes     Alcohol/week: 0.0 standard drinks    Comment: occ    Drug use: No   Sexual activity: Yes  Other Topics Concern   Not on file  Social History Narrative   Not on file   Social Determinants of Health   Financial Resource Strain: Not on file  Food Insecurity: Not on file  Transportation Needs: Not on file  Physical Activity: Not on file  Stress: Not on file  Social Connections: Not on file  Intimate Partner Violence: Not on file    Family History  Problem Relation Age of Onset   Diabetes Mother    Hyperlipidemia Mother    Hypertension Mother    Heart disease Father       Review of systems complete and found to be negative unless listed above      PHYSICAL EXAM  General: Well developed, well nourished, in no acute distress HEENT:  Normocephalic and atramatic Neck:  No JVD.  Lungs: Clear bilaterally to auscultation and percussion. Heart: HRRR . Normal S1 and S2 without gallops or murmurs.  Abdomen: Bowel sounds are positive, abdomen soft and non-tender  Msk:  Back normal, normal gait. Normal strength and tone for age. Extremities: No clubbing, cyanosis or edema.   Neuro: Alert and oriented X 3. Psych:  Good affect, responds appropriately  Labs:   Lab Results  Component Value Date   WBC 8.5 02/19/2021   HGB 13.2 02/19/2021   HCT 37.6 02/19/2021   MCV 91.5 02/19/2021   PLT 259 02/19/2021    Recent Labs  Lab 02/19/21 1649  NA 138  K 3.7  CL 106  CO2 25  BUN 9  CREATININE 0.77  CALCIUM 9.0  PROT 6.7  BILITOT 0.4  ALKPHOS 117  ALT 51*  AST 96*  GLUCOSE 131*   No results found for: CKTOTAL, CKMB, CKMBINDEX, TROPONINI No results found for: CHOL No results found for: HDL No results found for: LDLCALC No results found for: TRIG No results found for: CHOLHDL No results found for: LDLDIRECT    Radiology: NM Pulmonary Perfusion  Result Date: 01/21/2021 CLINICAL DATA:  Positive D-dimer.  Concern for pulmonary embolism. EXAM: NUCLEAR MEDICINE PERFUSION  LUNG SCAN TECHNIQUE: Perfusion images were obtained in multiple projections after intravenous injection of radiopharmaceutical. RADIOPHARMACEUTICALS:  4.2 mCi Tc-48m MAA COMPARISON:  Chest radiograph 01/21/2011 FINDINGS: No wedge-shaped peripheral perfusion defects within the lungs to suggest acute pulmonary embolism. Normal perfusion pattern. IMPRESSION: No evidence of acute pulmonary embolism. Electronically Signed   By: Genevive Bi M.D.   On: 01/21/2021 10:23   DG Chest Portable 1 View  Result Date: 02/19/2021 CLINICAL DATA:  Chest and epigastric pain for 1 day, neck and jaw pain EXAM: PORTABLE CHEST 1 VIEW COMPARISON:  01/20/2021 FINDINGS: The heart size and mediastinal contours are within normal limits. Both lungs are clear.  The visualized skeletal structures are unremarkable. IMPRESSION: No active disease. Electronically Signed   By: Sharlet Salina M.D.   On: 02/19/2021 17:30    EKG: Normal sinus rhythm diffuse nonspecific ST-T wave changes rate of 75  ASSESSMENT AND PLAN:  Unstable angina Chest pain Hypertension COPD Edema Smoking . Plan Agree rule out myocardial infarction Follow-up EKGs and troponins Nitrates as necessary for possible chest pain PPI for possible GERD type symptoms Will defer functional study Proceed directly with invasive strategy cardiac cath to rule out significant coronary disease Continue inhalers as necessary for COPD Advised patient to refrain from tobacco abuse  Signed: Alwyn Pea MD, 02/20/2021, 6:06 AM

## 2021-02-20 NOTE — Discharge Summary (Signed)
Physician Discharge Summary  Brenda Berry DOB: 1971-06-05 DOA: 02/19/2021  PCP: Duanne Limerick, MD  Admit date: 02/19/2021 Discharge date: 02/20/2021  Admitted From: Home Disposition: Home  Recommendations for Outpatient Follow-up:  Follow up with PCP in 1-2 weeks Follow-up with cardiology in 1 to 2 weeks Please obtain BMP/CBC in one week Please follow up on the following pending results: None  Home Health: No Equipment/Devices: None Discharge Condition: Stable CODE STATUS: Full Diet recommendation: Heart Healthy   Brief/Interim Summary: Brenda Berry is a 50 y.o. female with medical history significant for chronic pain, cervical spondylosis, multiple back pain, chronic opioid dependence, tobacco abuse, COPD, osteoarthritis of the knees, presents emergency department for chief concerns of chest pain.   She initially noticed bilateral swelling in her legs in July (R > L).  She presented to urgent care center, and bilateral lower extremity ultrasound was done as they were concerned for clots.  This was negative.  She was then referred to cardiology for further evaluation, Dr. Juliann Pares.    She started having chest pain, 3-4 days ago, similar to indigestion, unrelieved with TUMS/GasX. She reports some improvement with those medicatins, however today, it got worse and completely unrelieved with TUMS today and endorsed shortness of breath, neck pain, jaw pain. She reports these symptoms lasted about one hour and was relieved with nitrogylcerina.    EKG without any acute changes and troponin remained negative.  Patient continues to smoke.  Cardiology was consulted and they decided to proceed with cardiac catheterization to rule out any ischemia/angina.  Cardiac catheterization was without any significant abnormality.  She was also complaining about restless leg and started on Requip.  Her PCP should be able to monitor and titrate if needed.  Patient will continue with current  management and follow-up with her providers.  Discharge Diagnoses:  Principal Problem:   Chest pain Active Problems:   Opiate dependence (HCC)   Cervical spondylosis   Bulging lumbar disc (L3-4, L4-5, and L5-S1)   Tobacco abuse   Chronic obstructive pulmonary disease (COPD) (HCC)   Bilateral primary osteoarthritis of knee   Discharge Instructions  Discharge Instructions     Diet - low sodium heart healthy   Complete by: As directed    Discharge instructions   Complete by: As directed    It was pleasure taking care of you. Continue taking your home medications and follow-up with your cardiologist closely for further recommendations. You are also being started on a new medicine for your restless leg-take it at night and follow-up with your primary care provider for further management.   Increase activity slowly   Complete by: As directed       Allergies as of 02/20/2021       Reactions   Elmiron [pentosan Polysulfate] Nausea Only   Nose bleeds   Ciprofloxacin    Demerol [meperidine]    Ibuprofen    Iodinated Diagnostic Agents    Reports had SOB with last infusion of dye   Latex    Lodine [etodolac] Hives   Nifedipine    Other Hives   Nefedipine   Sulfa Antibiotics    Terbutaline         Medication List     STOP taking these medications    furosemide 20 MG tablet Commonly known as: Lasix       TAKE these medications    acetaminophen 325 MG tablet Commonly known as: TYLENOL Take 650 mg by mouth every 4 (four) hours  as needed.   albuterol 108 (90 Base) MCG/ACT inhaler Commonly known as: VENTOLIN HFA Inhale 2 puffs into the lungs every 6 (six) hours as needed for wheezing or shortness of breath.   bumetanide 0.5 MG tablet Commonly known as: BUMEX Take 1 tablet by mouth daily.   diclofenac Sodium 1 % Gel Commonly known as: VOLTAREN SMARTSIG:2-4 Gram(s) Topical 1-4 Times Daily   estradiol 1 MG tablet Commonly known as: ESTRACE TAKE 1 TABLET(1  MG) BY MOUTH DAILY   gabapentin 300 MG capsule Commonly known as: NEURONTIN Take 2 capsules (600 mg total) by mouth 4 (four) times daily.   hydrOXYzine 25 MG tablet Commonly known as: ATARAX/VISTARIL Take 25 mg by mouth 2 (two) times daily as needed.   metoprolol succinate 25 MG 24 hr tablet Commonly known as: TOPROL-XL Take 12.5 mg by mouth daily.   oxyCODONE-acetaminophen 5-325 MG tablet Commonly known as: PERCOCET/ROXICET Take 1 tablet by mouth 4 (four) times daily as needed.   rOPINIRole 4 MG 24 hr tablet Commonly known as: REQUIP XL Take 1 tablet (4 mg total) by mouth at bedtime.   sertraline 50 MG tablet Commonly known as: ZOLOFT Take 25 mg by mouth daily.   tiZANidine 4 MG tablet Commonly known as: ZANAFLEX Take 4 mg by mouth 2 (two) times daily as needed.        Follow-up Information     Duanne Limerick, MD. Schedule an appointment as soon as possible for a visit.   Specialty: Family Medicine Contact information: [address] [place] [address] [phone]                Allergies  Allergen Reactions   Elmiron [Pentosan Polysulfate] Nausea Only    Nose bleeds   Ciprofloxacin    Demerol [Meperidine]    Ibuprofen    Iodinated Diagnostic Agents     Reports had SOB with last infusion of dye   Latex    Lodine [Etodolac] Hives   Nifedipine    Other Hives    Nefedipine   Sulfa Antibiotics    Terbutaline     Consultations: Cardiology  Procedures/Studies: CARDIAC CATHETERIZATION  Result Date: 02/20/2021   The left ventricular systolic function is normal.   LV end diastolic pressure is mildly elevated.   There is no aortic valve stenosis. Conclusion: No angiographically apparent coronary artery disease. Elevated LVEDP estimated at 20 mmHg Normal LV systolic function. Recommendations: Chest pain is not related to epicardial coronary artery disease. Aggressive secondary prevention Follow up with primary cardiologist in 2 weeks    DG Chest Portable 1 View  Result Date: 02/19/2021 CLINICAL DATA:  Chest and epigastric pain for 1 day, neck and jaw pain EXAM: PORTABLE CHEST 1 VIEW COMPARISON:  01/20/2021 FINDINGS: The heart size and mediastinal contours are within normal limits. Both lungs are clear. The visualized skeletal structures are unremarkable. IMPRESSION: No active disease. Electronically Signed   By: Sharlet Salina M.D.   On: 02/19/2021 17:30    Subjective: Patient was seen and examined after the cardiac catheterization today.  Denies any more chest pain or shortness of breath.  Having some restless leg and asking for some help regarding that issue.  Discharge Exam: Vitals:   02/20/21 1100 02/20/21 1309  BP: 117/78 122/65  Pulse: 72 77  Resp:  18  Temp:  97.7 F (36.5 C)  SpO2:  98%   Vitals:   02/20/21 1000 02/20/21 1030 02/20/21 1100 02/20/21 1309  BP: 127/75 (!) 113/59  117/78 122/65  Pulse: 66 85 72 77  Resp:    18  Temp:    97.7 F (36.5 C)  TempSrc:      SpO2: 95% 94%  98%  Weight:      Height:        General: Pt is alert, awake, not in acute distress Cardiovascular: RRR, S1/S2 +, no rubs, no gallops Respiratory: CTA bilaterally, no wheezing, no rhonchi Abdominal: Soft, NT, ND, bowel sounds + Extremities: no edema, no cyanosis   The results of significant diagnostics from this hospitalization (including imaging, microbiology, ancillary and laboratory) are listed below for reference.    Microbiology: No results found for this or any previous visit (from the past 240 hour(s)).   Labs: BNP (last 3 results) Recent Labs    01/20/21 1828 02/19/21 1649  BNP 94.4 37.6   Basic Metabolic Panel: Recent Labs  Lab 02/19/21 1649 02/20/21 0541  NA 138 140  K 3.7 3.8  CL 106 107  CO2 25 24  GLUCOSE 131* 115*  BUN 9 7  CREATININE 0.77 0.58  CALCIUM 9.0 9.2   Liver Function Tests: Recent Labs  Lab 02/19/21 1649  AST 96*  ALT 51*  ALKPHOS 117  BILITOT 0.4  PROT 6.7  ALBUMIN  3.7   No results for input(s): LIPASE, AMYLASE in the last 168 hours. No results for input(s): AMMONIA in the last 168 hours. CBC: Recent Labs  Lab 02/19/21 1649 02/20/21 0541  WBC 8.5 9.4  NEUTROABS 6.1  --   HGB 13.2 13.1  HCT 37.6 37.3  MCV 91.5 90.5  PLT 259 252   Cardiac Enzymes: No results for input(s): CKTOTAL, CKMB, CKMBINDEX, TROPONINI in the last 168 hours. BNP: Invalid input(s): POCBNP CBG: No results for input(s): GLUCAP in the last 168 hours. D-Dimer No results for input(s): DDIMER in the last 72 hours. Hgb A1c No results for input(s): HGBA1C in the last 72 hours. Lipid Profile Recent Labs    02/20/21 0541  CHOL 162  HDL 68  LDLCALC 72  TRIG 109  CHOLHDL 2.4   Thyroid function studies No results for input(s): TSH, T4TOTAL, T3FREE, THYROIDAB in the last 72 hours.  Invalid input(s): FREET3 Anemia work up No results for input(s): VITAMINB12, FOLATE, FERRITIN, TIBC, IRON, RETICCTPCT in the last 72 hours. Urinalysis    Component Value Date/Time   COLORURINE Yellow 10/28/2011 1227   APPEARANCEUR Clear 10/28/2011 1227   LABSPEC 1.004 10/28/2011 1227   PHURINE 8.0 10/28/2011 1227   GLUCOSEU Negative 10/28/2011 1227   HGBUR 2+ 10/28/2011 1227   BILIRUBINUR Negative 10/28/2011 1227   KETONESUR Negative 10/28/2011 1227   PROTEINUR Negative 10/28/2011 1227   NITRITE Negative 10/28/2011 1227   LEUKOCYTESUR 1+ 10/28/2011 1227   Sepsis Labs Invalid input(s): PROCALCITONIN,  WBC,  LACTICIDVEN Microbiology No results found for this or any previous visit (from the past 240 hour(s)).  Time coordinating discharge: Over 30 minutes  SIGNED:  Arnetha Courser, MD  [facility] 02/20/2021, 2:02 PM  If 7PM-7AM, please contact night-coverage [email or web address]  This record has been created using Conservation officer, historic buildings. Errors have been sought and corrected,but may not always be located. Such creation errors do not reflect on the standard of care.

## 2021-02-21 ENCOUNTER — Telehealth: Payer: Self-pay

## 2021-02-21 ENCOUNTER — Encounter: Payer: Self-pay | Admitting: Internal Medicine

## 2021-02-21 NOTE — Telephone Encounter (Signed)
Transition Care Management Follow-up Telephone Call Date of discharge and from where: 02/20/21 [facility] How have you been since you were released from the hospital? Pt states she is doing okay; still feels weak and tired. Any questions or concerns? Yes - pt states she needs refill of albuterol inhaler and wants to discuss episodes of shortness of breath; pt states ropinirole XL 4 mg not covered by insurance - may need new rx sent in; also needs rx for nitroglycerin tablets to have on hand.   Items Reviewed: Did the pt receive and understand the discharge instructions provided? Yes  Medications obtained and verified? Yes  Other? No  Any new allergies since your discharge? No  Dietary orders reviewed? Yes Do you have support at home? Yes   Home Care and Equipment/Supplies: Were home health services ordered? no Were any new equipment or medical supplies ordered?  No  Functional Questionnaire: (I = Independent and D = Dependent) ADLs: I  Bathing/Dressing- I  Meal Prep- I  Eating- I  Maintaining continence- I  Transferring/Ambulation- I  Managing Meds- I  Follow up appointments reviewed:  PCP Hospital f/u appt confirmed? Yes  Scheduled to see Dr. Yetta Barre on 02/21/21 @ 10:40. Specialist Hospital f/u appt confirmed? Yes  Scheduled to see Dr. Juliann Pares on 02/27/21. Are transportation arrangements needed? No  If their condition worsens, is the pt aware to call PCP or go to the Emergency Dept.? Yes Was the patient provided with contact information for the PCP's office or ED? Yes Was to pt encouraged to call back with questions or concerns? Yes

## 2021-02-22 ENCOUNTER — Other Ambulatory Visit: Payer: Self-pay

## 2021-02-22 ENCOUNTER — Ambulatory Visit (INDEPENDENT_AMBULATORY_CARE_PROVIDER_SITE_OTHER): Payer: Medicare Other | Admitting: Family Medicine

## 2021-02-22 ENCOUNTER — Other Ambulatory Visit
Admission: RE | Admit: 2021-02-22 | Discharge: 2021-02-22 | Disposition: A | Discharge status: Home / Self Care | Payer: Medicare Other | Attending: Family Medicine | Admitting: Family Medicine

## 2021-02-22 ENCOUNTER — Encounter: Payer: Self-pay | Admitting: Family Medicine

## 2021-02-22 VITALS — BP 120/72 | HR 102 | Ht 62.0 in | Wt 164.0 lb

## 2021-02-22 DIAGNOSIS — R079 Chest pain, unspecified: Secondary | ICD-10-CM

## 2021-02-22 DIAGNOSIS — R0609 Other forms of dyspnea: Secondary | ICD-10-CM

## 2021-02-22 DIAGNOSIS — R0600 Dyspnea, unspecified: Secondary | ICD-10-CM

## 2021-02-22 DIAGNOSIS — Z09 Encounter for follow-up examination after completed treatment for conditions other than malignant neoplasm: Secondary | ICD-10-CM | POA: Insufficient documentation

## 2021-02-22 DIAGNOSIS — R739 Hyperglycemia, unspecified: Secondary | ICD-10-CM

## 2021-02-22 DIAGNOSIS — G2581 Restless legs syndrome: Secondary | ICD-10-CM | POA: Diagnosis not present

## 2021-02-22 LAB — BASIC METABOLIC PANEL
Anion gap: 10 (ref 5–15)
BUN: 11 mg/dL (ref 6–20)
CO2: 26 mmol/L (ref 22–32)
Calcium: 9.3 mg/dL (ref 8.9–10.3)
Chloride: 101 mmol/L (ref 98–111)
Creatinine, Ser: 0.79 mg/dL (ref 0.44–1.00)
GFR, Estimated: >60 mL/min (ref >60)
Glucose, Bld: 118 mg/dL — ABNORMAL HIGH (ref 70–99)
Potassium: 3.6 mmol/L (ref 3.5–5.1)
Sodium: 137 mmol/L (ref 135–145)

## 2021-02-22 LAB — CBC WITH DIFFERENTIAL/PLATELET
Abs Immature Granulocytes: 0.04 10*3/uL (ref 0.00–0.07)
Basophils Absolute: 0.1 10*3/uL (ref 0.0–0.1)
Basophils Relative: 1 %
Eosinophils Absolute: 0.1 10*3/uL (ref 0.0–0.5)
Eosinophils Relative: 1 %
HCT: 39.4 % (ref 36.0–46.0)
Hemoglobin: 13.5 g/dL (ref 12.0–15.0)
Immature Granulocytes: 0 %
Lymphocytes Relative: 31 %
Lymphs Abs: 3.3 10*3/uL (ref 0.7–4.0)
MCH: 31.2 pg (ref 26.0–34.0)
MCHC: 34.3 g/dL (ref 30.0–36.0)
MCV: 91 fL (ref 80.0–100.0)
Monocytes Absolute: 0.5 10*3/uL (ref 0.1–1.0)
Monocytes Relative: 4 %
Neutro Abs: 6.7 10*3/uL (ref 1.7–7.7)
Neutrophils Relative %: 63 %
Platelets: 296 10*3/uL (ref 150–400)
RBC: 4.33 MIL/uL (ref 3.87–5.11)
RDW: 12.5 % (ref 11.5–15.5)
WBC: 10.6 10*3/uL — ABNORMAL HIGH (ref 4.0–10.5)
nRBC: 0 % (ref 0.0–0.2)

## 2021-02-22 LAB — BRAIN NATRIURETIC PEPTIDE: B Natriuretic Peptide: 39.4 pg/mL (ref 0.0–100.0)

## 2021-02-22 MED ORDER — PRAMIPEXOLE DIHYDROCHLORIDE 0.25 MG PO TABS: 0.2500 mg | ORAL_TABLET | Freq: Every evening | ORAL | 5 refills | Status: DC

## 2021-02-22 MED ORDER — NITROGLYCERIN 0.4 MG SL SUBL: 0.4000 mg | SUBLINGUAL_TABLET | SUBLINGUAL | 3 refills | Status: AC | PRN

## 2021-02-22 NOTE — Addendum Note (Signed)
Addended by: Chauncey Cruel F on: 02/22/2021 03:18 PM   Modules accepted: Orders

## 2021-02-22 NOTE — Progress Notes (Signed)
Date:  02/22/2021   Name:  Brenda Berry   DOB:  05/25/1971   MRN:  [id number]   Chief Complaint: Follow-up and Hyperglycemia  Patient is a 50 year old female who presents for a hosp followup exam. The patient reports the following problems: dyspnea. Health maintenance has been reviewed up to date.    Hyperglycemia This is a chronic problem. The current episode started more than 1 year ago. The problem occurs intermittently. The problem has been waxing and waning. Associated symptoms include chest pain. Pertinent negatives include no abdominal pain, chills, congestion, coughing, fever, headaches, myalgias, nausea, neck pain, rash or sore throat. The treatment provided moderate relief.  Shortness of Breath This is a new problem. The current episode started more than 1 month ago (3 months). The problem has been waxing and waning. Associated symptoms include chest pain, leg swelling, orthopnea, PND and wheezing. Pertinent negatives include no abdominal pain, ear pain, fever, headaches, neck pain, rash or sore throat. The symptoms are aggravated by lying flat. Risk factors include smoking.   Lab Results  Component Value Date   CREATININE 0.58 02/20/2021   BUN 7 02/20/2021   NA 140 02/20/2021   K 3.8 02/20/2021   CL 107 02/20/2021   CO2 24 02/20/2021   Lab Results  Component Value Date   CHOL 162 02/20/2021   HDL 68 02/20/2021   LDLCALC 72 02/20/2021   TRIG 109 02/20/2021   CHOLHDL 2.4 02/20/2021   No results found for: TSH No results found for: HGBA1C Lab Results  Component Value Date   WBC 9.4 02/20/2021   HGB 13.1 02/20/2021   HCT 37.3 02/20/2021   MCV 90.5 02/20/2021   PLT 252 02/20/2021   Lab Results  Component Value Date   ALT 51 (H) 02/19/2021   AST 96 (H) 02/19/2021   ALKPHOS 117 02/19/2021   BILITOT 0.4 02/19/2021     Review of Systems  Constitutional:  Negative for chills and fever.  HENT:  Negative for congestion, drooling, ear discharge, ear pain and  sore throat.   Respiratory:  Positive for shortness of breath and wheezing. Negative for cough.   Cardiovascular:  Positive for chest pain, orthopnea, leg swelling and PND. Negative for palpitations.  Gastrointestinal:  Negative for abdominal pain, blood in stool, constipation, diarrhea and nausea.  Endocrine: Negative for polydipsia.  Genitourinary:  Negative for dysuria, frequency, hematuria and urgency.  Musculoskeletal:  Negative for back pain, myalgias and neck pain.  Skin:  Negative for rash.  Allergic/Immunologic: Negative for environmental allergies.  Neurological:  Negative for dizziness and headaches.  Hematological:  Does not bruise/bleed easily.  Psychiatric/Behavioral:  Negative for suicidal ideas. The patient is not nervous/anxious.    Patient Active Problem List   Diagnosis Date Noted   Chest pain 02/19/2021   Vasomotor symptoms due to menopause 10/19/2019   Bilateral primary osteoarthritis of knee 08/06/2019   Tendinitis of thumb 08/05/2019   Neuropathy of both feet 05/05/2019   Left anterior knee pain 02/20/2019   Chronic diarrhea 02/05/2019   Raynaud's phenomenon without gangrene 02/05/2019   Dyspepsia    Dysphagia    RUQ pain    Lumbar facet syndrome (Bilateral) (R>L) 07/25/2015   Chronic neck pain (Location of Secondary source of pain) (Bilateral) (L>R) 07/25/2015   Allergy to contrast media (used for diagnostic x-rays) 04/26/2015   Failed back surgical syndrome (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) 04/26/2015   Epidural fibrosis 04/26/2015   Musculoskeletal pain 04/26/2015   Neurogenic  pain 04/26/2015   Neuropathic pain 04/26/2015   Bulging lumbar disc (L3-4, L4-5, and L5-S1) 04/26/2015   Lumbar radiculopathy 04/26/2015   Chronic pelvic pain in female 04/26/2015   History of endometriosis 04/26/2015   Sacrococcygeal pain (tailbone pain/sacrococcygeal neuralgia) 04/26/2015   Coccygodynia 04/26/2015   Herniated cervical disc (C4-5 and C5-6) 04/26/2015   Tobacco abuse  04/26/2015   Chronic obstructive pulmonary disease (COPD) (HCC) 04/26/2015   Urinary incontinence 04/26/2015   History of bronchitis 04/26/2015   Hiatal hernia 04/26/2015   Pain due to interstitial cystitis 04/26/2015   History of recurrent UTI (urinary tract infection) 04/26/2015   History of nephrolithiasis 04/26/2015   History of hematuria 04/26/2015   Osteoarthrosis 04/26/2015   Status post hysterectomy 04/26/2015   Chronic pain 04/25/2015   Long term current use of opiate analgesic 04/25/2015   Long term prescription opiate use 04/25/2015   Opiate use (90 MME/Day) 04/25/2015   Opiate dependence (HCC) 04/25/2015   Encounter for therapeutic drug level monitoring 04/25/2015   Chronic low back pain (Location of Primary Source of Pain) (Bilateral) (L>R) 04/25/2015   Lumbar spondylosis 04/25/2015   Chronic radicular Cervical pain (Bilateral C6 Dermatomal distribution) 04/25/2015   Cervical spondylosis 04/25/2015   Chronic radicular Lumbar pain (Location of Tertiary source of pain) (Right S1 Dermatomal pain) 04/25/2015   Current tobacco use 01/28/2015   Dyspareunia 02/18/2013   High-tone pelvic floor dysfunction 02/18/2013   Increased frequency of urination 02/18/2013   Nocturia 02/18/2013   Urinary hesitancy 02/18/2013    Allergies  Allergen Reactions   Elmiron [Pentosan Polysulfate] Nausea Only    Nose bleeds   Furosemide Hives, Itching and Rash   Ciprofloxacin    Demerol [Meperidine]    Ibuprofen    Iodinated Diagnostic Agents     Reports had SOB with last infusion of dye   Latex    Lodine [Etodolac] Hives   Nifedipine    Other Hives    Nefedipine   Sulfa Antibiotics    Terbutaline     Past Surgical History:  Procedure Laterality Date   BACK SURGERY     BLADDER SURGERY     ESOPHAGOGASTRODUODENOSCOPY (EGD) WITH PROPOFOL N/A 01/15/2019   Procedure: ESOPHAGOGASTRODUODENOSCOPY (EGD) WITH PROPOFOL;  Surgeon: Toney Reil, MD;  Location: [facility];  Service:  Gastroenterology;  Laterality: N/A;   LEFT HEART CATH AND CORONARY ANGIOGRAPHY N/A 02/20/2021   Procedure: LEFT HEART CATH AND CORONARY ANGIOGRAPHY and possible PCI and stent;  Surgeon: Alwyn Pea, MD;  Location: [facility];  Service: Cardiovascular;  Laterality: N/A;   VAGINAL HYSTERECTOMY      Social History   Tobacco Use   Smoking status: Every Day    Packs/day: 0.50    Years: 26.00    Pack years: 13.00    Types: Cigarettes   Smokeless tobacco: Never  Vaping Use   Vaping Use: Never used  Substance Use Topics   Alcohol use: Yes    Alcohol/week: 0.0 standard drinks    Comment: occ    Drug use: No     Medication list has been reviewed and updated.  Current Meds  Medication Sig   acetaminophen (TYLENOL) 325 MG tablet Take 650 mg by mouth every 4 (four) hours as needed.    albuterol (PROVENTIL HFA;VENTOLIN HFA) 108 (90 Base) MCG/ACT inhaler Inhale 2 puffs into the lungs every 6 (six) hours as needed for wheezing or shortness of breath.   diclofenac Sodium (VOLTAREN) 1 % GEL SMARTSIG:2-4 Gram(s) Topical  1-4 Times Daily   estradiol (ESTRACE) 1 MG tablet TAKE 1 TABLET(1 MG) BY MOUTH DAILY   gabapentin (NEURONTIN) 300 MG capsule Take 2 capsules (600 mg total) by mouth 4 (four) times daily. (Patient taking differently: Take 600 mg by mouth 3 (three) times daily.)   hydrOXYzine (ATARAX/VISTARIL) 25 MG tablet Take 25 mg by mouth 2 (two) times daily as needed.   metoprolol succinate (TOPROL-XL) 25 MG 24 hr tablet Take 12.5 mg by mouth daily.   oxyCODONE-acetaminophen (PERCOCET/ROXICET) 5-325 MG tablet Take 1 tablet by mouth 4 (four) times daily as needed.   sertraline (ZOLOFT) 50 MG tablet Take 25 mg by mouth daily.   tiZANidine (ZANAFLEX) 4 MG tablet Take 4 mg by mouth 2 (two) times daily as needed.    PHQ 2/9 Scores 02/22/2021 01/19/2020 01/06/2020 09/30/2019  PHQ - 2 Score 4 0 0 0  PHQ- 9 Score 12 - - -    GAD 7 : Generalized Anxiety Score 02/22/2021  Nervous,  Anxious, on Edge 1  Control/stop worrying 2  Worry too much - different things 2  Trouble relaxing 1  Restless 1  Easily annoyed or irritable 0  Afraid - awful might happen 2  Total GAD 7 Score 9  Anxiety Difficulty Not difficult at all    BP Readings from Last 3 Encounters:  02/22/21 120/72  02/20/21 122/65  01/21/21 (!) 112/52    Physical Exam Vitals and nursing note reviewed.  Constitutional:      Appearance: She is well-developed.  HENT:     Head: Normocephalic.     Right Ear: Tympanic membrane, ear canal and external ear normal. There is no impacted cerumen.     Left Ear: Tympanic membrane, ear canal and external ear normal. There is no impacted cerumen.     Nose: Nose normal.  Eyes:     General: Lids are everted, no foreign bodies appreciated. No scleral icterus.       Left eye: No foreign body or hordeolum.     Conjunctiva/sclera: Conjunctivae normal.     Right eye: Right conjunctiva is not injected.     Left eye: Left conjunctiva is not injected.     Pupils: Pupils are equal, round, and reactive to light.  Neck:     Thyroid: No thyromegaly.     Vascular: No JVD.     Trachea: No tracheal deviation.  Cardiovascular:     Rate and Rhythm: Normal rate and regular rhythm.     Heart sounds: Normal heart sounds. No murmur heard.   No friction rub. No gallop.  Pulmonary:     Effort: Pulmonary effort is normal. No respiratory distress.     Breath sounds: Normal breath sounds. No wheezing, rhonchi or rales.  Abdominal:     General: Bowel sounds are normal.     Palpations: Abdomen is soft. There is no mass.     Tenderness: There is no abdominal tenderness. There is no guarding or rebound.  Musculoskeletal:        General: No tenderness. Normal range of motion.     Cervical back: Normal range of motion and neck supple.  Lymphadenopathy:     Cervical: No cervical adenopathy.  Skin:    General: Skin is warm.     Findings: No rash.  Neurological:     Mental Status:  She is alert and oriented to person, place, and time.     Cranial Nerves: No cranial nerve deficit.     Deep Tendon Reflexes:  Reflexes normal.  Psychiatric:        Mood and Affect: Mood is not anxious or depressed.    Wt Readings from Last 3 Encounters:  02/22/21 164 lb (74.4 kg)  02/19/21 160 lb (72.6 kg)  01/20/21 158 lb 11.7 oz (72 kg)    BP 120/72   Pulse (!) 102   Ht 5\' 2"  (1.575 m)   Wt 164 lb (74.4 kg)   SpO2 98%   BMI 30.00 kg/m   Assessment and Plan:  1. Hospital discharge follow-up Patient is hospital discharge follow-up for chest pain.  Catheterization was unremarkable.  Patient is continued to have some chest pain.  It was noted that BMP and CBC were suggested to be done within the next week. - CBC with Differential/Platelet - Basic metabolic panel  2. Hyperglycemia Patient's had history of hyperglycemia in the past and we will check A1c for prediabetic possibilities. - HgB A1c  3. Chest pain, unspecified type Patient's continued to have chest pain on an episodic basis which describes radiating to the bilateral neck area.  Reviewed patient's catheterization and chest x-ray.  Also reviewed patient's endoscopy.  Patient would like nitroglycerin and I will provide nitroglycerin to be taken on a once basis pain appears and if it does not resolve patient is to go to the hospital. - nitroGLYCERIN (NITROSTAT) 0.4 MG SL tablet; Place 1 tablet (0.4 mg total) under the tongue every 5 (five) minutes as needed for chest pain.  Dispense: 50 tablet; Refill: 3 - Brain natriuretic peptide; Future  4. DOE (dyspnea on exertion) Patient's been having dyspnea on exertion continuing as well as some PND and orthopnea but that is thought to be perhaps more so for the back that she is elevated the head of her bed.  We will check a BMP that has been followed on the previous week and to make sure that it is in normal range. - Brain natriuretic peptide; Future  5. Restless leg  syndrome Chronic.  Persistent.  Stable.  Patient was placed on Requip but insurance is unable to pay for this.  We will change to Mirapex 0.25 nightly at this time. - pramipexole (MIRAPEX) 0.25 MG tablet; Take 1 tablet (0.25 mg total) by mouth at bedtime.  Dispense: 30 tablet; Refill: 5

## 2021-02-23 ENCOUNTER — Other Ambulatory Visit: Payer: Self-pay

## 2021-02-23 DIAGNOSIS — J4521 Mild intermittent asthma with (acute) exacerbation: Secondary | ICD-10-CM

## 2021-02-23 LAB — HEMOGLOBIN A1C
Hgb A1c MFr Bld: 5.7 % — ABNORMAL HIGH (ref 4.8–5.6)
Mean Plasma Glucose: 116.89 mg/dL

## 2021-02-23 MED ORDER — ALBUTEROL SULFATE HFA 108 (90 BASE) MCG/ACT IN AERS: 2.0000 | INHALATION_SPRAY | Freq: Four times a day (QID) | RESPIRATORY_TRACT | 2 refills | Status: DC | PRN

## 2021-02-28 DIAGNOSIS — R600 Localized edema: Secondary | ICD-10-CM | POA: Diagnosis not present

## 2021-02-28 DIAGNOSIS — I208 Other forms of angina pectoris: Secondary | ICD-10-CM | POA: Diagnosis not present

## 2021-02-28 DIAGNOSIS — R0602 Shortness of breath: Secondary | ICD-10-CM | POA: Diagnosis not present

## 2021-03-13 DIAGNOSIS — R0602 Shortness of breath: Secondary | ICD-10-CM | POA: Diagnosis not present

## 2021-03-13 DIAGNOSIS — Z79899 Other long term (current) drug therapy: Secondary | ICD-10-CM | POA: Diagnosis not present

## 2021-03-13 DIAGNOSIS — G8929 Other chronic pain: Secondary | ICD-10-CM | POA: Diagnosis not present

## 2021-03-13 DIAGNOSIS — M5416 Radiculopathy, lumbar region: Secondary | ICD-10-CM | POA: Diagnosis not present

## 2021-03-13 DIAGNOSIS — F17210 Nicotine dependence, cigarettes, uncomplicated: Secondary | ICD-10-CM | POA: Diagnosis not present

## 2021-03-15 DIAGNOSIS — Z79899 Other long term (current) drug therapy: Secondary | ICD-10-CM | POA: Diagnosis not present

## 2021-03-27 DIAGNOSIS — I7300 Raynaud's syndrome without gangrene: Secondary | ICD-10-CM | POA: Diagnosis not present

## 2021-03-27 DIAGNOSIS — I208 Other forms of angina pectoris: Secondary | ICD-10-CM | POA: Diagnosis not present

## 2021-03-27 DIAGNOSIS — F17200 Nicotine dependence, unspecified, uncomplicated: Secondary | ICD-10-CM | POA: Diagnosis not present

## 2021-03-27 DIAGNOSIS — R0602 Shortness of breath: Secondary | ICD-10-CM | POA: Diagnosis not present

## 2021-03-27 DIAGNOSIS — R002 Palpitations: Secondary | ICD-10-CM | POA: Diagnosis not present

## 2021-03-27 DIAGNOSIS — R000 Tachycardia, unspecified: Secondary | ICD-10-CM | POA: Diagnosis not present

## 2021-03-27 DIAGNOSIS — R600 Localized edema: Secondary | ICD-10-CM | POA: Diagnosis not present

## 2021-03-27 DIAGNOSIS — K219 Gastro-esophageal reflux disease without esophagitis: Secondary | ICD-10-CM | POA: Diagnosis not present

## 2021-03-27 DIAGNOSIS — J449 Chronic obstructive pulmonary disease, unspecified: Secondary | ICD-10-CM | POA: Diagnosis not present

## 2021-03-27 DIAGNOSIS — I509 Heart failure, unspecified: Secondary | ICD-10-CM | POA: Diagnosis not present

## 2021-04-13 DIAGNOSIS — M5430 Sciatica, unspecified side: Secondary | ICD-10-CM | POA: Diagnosis not present

## 2021-04-13 DIAGNOSIS — Z79899 Other long term (current) drug therapy: Secondary | ICD-10-CM | POA: Diagnosis not present

## 2021-04-13 DIAGNOSIS — G8929 Other chronic pain: Secondary | ICD-10-CM | POA: Diagnosis not present

## 2021-04-13 DIAGNOSIS — F17210 Nicotine dependence, cigarettes, uncomplicated: Secondary | ICD-10-CM | POA: Diagnosis not present

## 2021-04-13 DIAGNOSIS — M545 Low back pain, unspecified: Secondary | ICD-10-CM | POA: Diagnosis not present

## 2021-05-11 DIAGNOSIS — F17210 Nicotine dependence, cigarettes, uncomplicated: Secondary | ICD-10-CM | POA: Diagnosis not present

## 2021-05-11 DIAGNOSIS — Z79899 Other long term (current) drug therapy: Secondary | ICD-10-CM | POA: Diagnosis not present

## 2021-05-11 DIAGNOSIS — G8929 Other chronic pain: Secondary | ICD-10-CM | POA: Diagnosis not present

## 2021-05-11 DIAGNOSIS — M542 Cervicalgia: Secondary | ICD-10-CM | POA: Diagnosis not present

## 2021-05-11 DIAGNOSIS — M545 Low back pain, unspecified: Secondary | ICD-10-CM | POA: Diagnosis not present

## 2021-05-15 ENCOUNTER — Telehealth: Payer: Self-pay | Admitting: Family Medicine

## 2021-05-15 NOTE — Telephone Encounter (Signed)
Patient thinks she has the FLU or Bronchitis.  This started about 4 days ago. Stated that she has a very bad cough and on and off fever.   She would like to know if something could be called in for her.

## 2021-05-15 NOTE — Telephone Encounter (Signed)
Patient informed she needs to come in. Scheduled for Wednesday at 1140. Patient will go to Urgent Care if her symptoms worsen, she has SOB or fever that don't break with tylenol.

## 2021-05-17 ENCOUNTER — Ambulatory Visit (INDEPENDENT_AMBULATORY_CARE_PROVIDER_SITE_OTHER): Payer: Medicare Other

## 2021-05-17 ENCOUNTER — Ambulatory Visit: Payer: Medicare Other | Admitting: Family Medicine

## 2021-05-17 DIAGNOSIS — Z0000 Encounter for general adult medical examination without abnormal findings: Secondary | ICD-10-CM | POA: Diagnosis not present

## 2021-05-17 DIAGNOSIS — Z5941 Food insecurity: Secondary | ICD-10-CM

## 2021-05-17 DIAGNOSIS — Z1231 Encounter for screening mammogram for malignant neoplasm of breast: Secondary | ICD-10-CM | POA: Diagnosis not present

## 2021-05-17 DIAGNOSIS — Z599 Problem related to housing and economic circumstances, unspecified: Secondary | ICD-10-CM

## 2021-05-17 DIAGNOSIS — Z0100 Encounter for examination of eyes and vision without abnormal findings: Secondary | ICD-10-CM | POA: Diagnosis not present

## 2021-05-17 DIAGNOSIS — Z1211 Encounter for screening for malignant neoplasm of colon: Secondary | ICD-10-CM | POA: Diagnosis not present

## 2021-05-17 NOTE — Progress Notes (Signed)
Subjective:   Brenda Berry is a 49 y.o. female who presents for Medicare Annual (Subsequent) preventive examination.  Virtual Visit via Telephone Note  I connected with  Brenda Berry on 05/17/21 at  9:20 AM EST by telephone and verified that I am speaking with the correct person using two identifiers.  Location: Patient: home Provider: [facility] Persons participating in the virtual visit: [place]   I discussed the limitations, risks, security and privacy concerns of performing an evaluation and management service by telephone and the availability of in person appointments. The patient expressed understanding and agreed to proceed.  Interactive audio and video telecommunications were attempted between this nurse and patient, however failed, due to patient having technical difficulties OR patient did not have access to video capability.  We continued and completed visit with audio only.  Some vital signs may be absent or patient reported.   Clemetine Marker, LPN   Review of Systems     Cardiac Risk Factors include: hypertension;obesity (BMI >30kg/m2);smoking/ tobacco exposure     Objective:    Today's Vitals   05/17/21 0927  PainSc: 6    There is no height or weight on file to calculate BMI.  Advanced Directives 05/17/2021 02/19/2021 02/19/2021 01/20/2021 01/20/2021 01/19/2020 01/06/2020  Does Patient Have a Medical Advance Directive? No No No No No No No  Would patient like information on creating a medical advance directive? No - Patient declined No - Patient declined No - Patient declined - - - No - Patient declined    Current Medications (verified) Outpatient Encounter Medications as of 05/17/2021  Medication Sig   acetaminophen (TYLENOL) 325 MG tablet Take 650 mg by mouth every 4 (four) hours as needed.    albuterol (VENTOLIN HFA) 108 (90 Base) MCG/ACT inhaler Inhale 2 puffs into the lungs every 6 (six) hours as needed for wheezing or shortness of breath.    bumetanide (BUMEX) 0.5 MG tablet Take 1 tablet by mouth daily.   cholecalciferol (VITAMIN D3) 25 MCG (1000 UNIT) tablet Take 1,000 Units by mouth daily.   diclofenac Sodium (VOLTAREN) 1 % GEL SMARTSIG:2-4 Gram(s) Topical 1-4 Times Daily   gabapentin (NEURONTIN) 300 MG capsule Take 2 capsules (600 mg total) by mouth 4 (four) times daily.   hydrOXYzine (ATARAX/VISTARIL) 25 MG tablet Take 25 mg by mouth 2 (two) times daily as needed.   magnesium 30 MG tablet Take 30 mg by mouth 2 (two) times daily.   metoprolol succinate (TOPROL-XL) 25 MG 24 hr tablet Take 12.5 mg by mouth daily.   nitroGLYCERIN (NITROSTAT) 0.4 MG SL tablet Place 1 tablet (0.4 mg total) under the tongue every 5 (five) minutes as needed for chest pain.   oxyCODONE-acetaminophen (PERCOCET/ROXICET) 5-325 MG tablet Take 1 tablet by mouth 4 (four) times daily as needed.   pramipexole (MIRAPEX) 0.25 MG tablet Take 1 tablet (0.25 mg total) by mouth at bedtime.   tiZANidine (ZANAFLEX) 4 MG tablet Take 4 mg by mouth 2 (two) times daily as needed.   vitamin C (ASCORBIC ACID) 500 MG tablet Take 500 mg by mouth daily.   estradiol (ESTRACE) 1 MG tablet TAKE 1 TABLET(1 MG) BY MOUTH DAILY (Patient not taking: Reported on 05/17/2021)   [DISCONTINUED] sertraline (ZOLOFT) 50 MG tablet Take 25 mg by mouth daily.   No facility-administered encounter medications on file as of 05/17/2021.    Allergies (verified) Elmiron [pentosan polysulfate], Furosemide, Ciprofloxacin, Demerol [meperidine], Ibuprofen, Iodinated diagnostic agents, Latex, Lodine [etodolac], Nifedipine, Other, Sulfa antibiotics,  and Terbutaline   History: Past Medical History:  Diagnosis Date   Arthritis    CHF (congestive heart failure) (HCC)    Chronic neck and back pain    Degenerative disc disease, cervical    Degenerative disc disease, lumbar    GERD (gastroesophageal reflux disease)    Hiatal hernia 04/26/2015   History of bronchitis 04/26/2015   History of hematuria  04/26/2015   History of nephrolithiasis 04/26/2015   History of recurrent UTI (urinary tract infection) 04/26/2015   Interstitial cystitis    Status post hysterectomy 04/26/2015   Past Surgical History:  Procedure Laterality Date   BACK SURGERY     BLADDER SURGERY     ESOPHAGOGASTRODUODENOSCOPY (EGD) WITH PROPOFOL N/A 01/15/2019   Procedure: ESOPHAGOGASTRODUODENOSCOPY (EGD) WITH PROPOFOL;  Surgeon: Lin Landsman, MD;  Location: [place];  Service: Gastroenterology;  Laterality: N/A;   LEFT HEART CATH AND CORONARY ANGIOGRAPHY N/A 02/20/2021   Procedure: LEFT HEART CATH AND CORONARY ANGIOGRAPHY and possible PCI and stent;  Surgeon: Yolonda Kida, MD;  Location: [facility];  Service: Cardiovascular;  Laterality: N/A;   VAGINAL HYSTERECTOMY     Family History  Problem Relation Age of Onset   Diabetes Mother    Hyperlipidemia Mother    Hypertension Mother    Heart disease Father    Social History   Socioeconomic History   Marital status: Divorced    Spouse name: Not on file   Number of children: 3   Years of education: Not on file   Highest education level: Not on file  Occupational History   Occupation: disabled  Tobacco Use   Smoking status: Every Day    Packs/day: 0.50    Years: 26.00    Pack years: 13.00    Types: Cigarettes   Smokeless tobacco: Never  Vaping Use   Vaping Use: Never used  Substance and Sexual Activity   Alcohol use: Yes    Alcohol/week: 0.0 standard drinks    Comment: occ    Drug use: No   Sexual activity: Yes  Other Topics Concern   Not on file  Social History Narrative   Pt lives alone   Social Determinants of Health   Financial Resource Strain: High Risk   Difficulty of Paying Living Expenses: Hard  Food Insecurity: Food Insecurity Present   Worried About Running Out of Food in the Last Year: Sometimes true   Ran Out of Food in the Last Year: Never true  Transportation Needs: No Transportation Needs   Lack of  Transportation (Medical): No   Lack of Transportation (Non-Medical): No  Physical Activity: Sufficiently Active   Days of Exercise per Week: 7 days   Minutes of Exercise per Session: 30 min  Stress: No Stress Concern Present   Feeling of Stress : Not at all  Social Connections: Socially Isolated   Frequency of Communication with Friends and Family: More than three times a week   Frequency of Social Gatherings with Friends and Family: Three times a week   Attends Religious Services: Never   Active Member of Clubs or Organizations: No   Attends Archivist Meetings: Never   Marital Status: Divorced    Tobacco Counseling Ready to quit: No Counseling given: Not Answered   Clinical Intake:  Pre-visit preparation completed: Yes  Pain : 0-10 Pain Score: 6  Pain Type: Chronic pain Pain Location: Neck Pain Descriptors / Indicators: Aching, Sore Pain Onset: More than a month ago Pain Frequency: Constant  Nutritional Risks: None Diabetes: No  How often do you need to have someone help you when you read instructions, pamphlets, or other written materials from your doctor or pharmacy?: 1 - Never    Interpreter Needed?: No  Information entered by :: Clemetine Marker LPN   Activities of Daily Living In your present state of health, do you have any difficulty performing the following activities: 05/17/2021 02/19/2021  Hearing? N N  Vision? Y N  Difficulty concentrating or making decisions? N N  Walking or climbing stairs? Y N  Dressing or bathing? N N  Doing errands, shopping? N N  Preparing Food and eating ? N -  Using the Toilet? N -  In the past six months, have you accidently leaked urine? N -  Do you have problems with loss of bowel control? N -  Managing your Medications? N -  Managing your Finances? N -  Housekeeping or managing your Housekeeping? N -  Some recent data might be hidden    Patient Care Team: Juline Patch, MD as PCP - General (Family  Medicine)  Indicate any recent Medical Services you may have received from other than [facility] providers in the past year (date may be approximate).     Assessment:   This is a routine wellness examination for [place].  Hearing/Vision screen Hearing Screening - Comments:: Pt denies hearing difficulty Vision Screening - Comments:: Pt wears reading glasses, past due for eye exam. Referral sent to ophthalmology today   Dietary issues and exercise activities discussed: Current Exercise Habits: Home exercise routine, Type of exercise: walking, Time (Minutes): 30, Frequency (Times/Week): 7, Weekly Exercise (Minutes/Week): 210, Intensity: Mild, Exercise limited by: orthopedic condition(s)   Goals Addressed             This Visit's Progress    Quit Smoking       If you wish to quit smoking, help is available. For free tobacco cessation program offerings call the [facility] at 626-737-1919 or Live Well Line at (629) 722-6715. You may also visit [email or web address] or email [email or web address] for more information on other programs.         Depression Screen PHQ 2/9 Scores 05/17/2021 02/22/2021 01/19/2020 01/06/2020 09/30/2019 09/16/2019 08/19/2019  PHQ - 2 Score 2 4 0 0 0 0 0  PHQ- 9 Score 15 12 - - - - -    Fall Risk Fall Risk  05/17/2021 01/19/2020 01/06/2020 12/02/2019 09/30/2019  Falls in the past year? 1 0 0 0 0  Number falls in past yr: 0 - 0 0 -  Comment - - - - -  Injury with Fall? 0 - 0 - -  Risk for fall due to : Orthopedic patient - - - -  Follow up Falls prevention discussed - Falls evaluation completed - -    FALL RISK PREVENTION PERTAINING TO THE HOME:  Any stairs in or around the home? Yes  If so, are there any without handrails? No  Home free of loose throw rugs in walkways, pet beds, electrical cords, etc? Yes  Adequate lighting in your home to reduce risk of falls? Yes   ASSISTIVE DEVICES UTILIZED TO PREVENT FALLS:  Life alert? No  Use of a cane,  walker or w/c? No  Grab bars in the bathroom? Yes  Shower chair or bench in shower? No  Elevated toilet seat or a handicapped toilet? No   TIMED UP AND GO:  Was the test performed? No. Telephonic visit.  Telephonic visit.   Cognitive Function: Normal cognitive status assessed by direct observation by this Nurse Health Advisor. No abnormalities found.          Immunizations  There is no immunization history on file for this patient.  TDAP status: Due, Education has been provided regarding the importance of this vaccine. Advised may receive this vaccine at local pharmacy or Health Dept. Aware to provide a copy of the vaccination record if obtained from local pharmacy or Health Dept. Verbalized acceptance and understanding.  Flu Vaccine status: Declined, Education has been provided regarding the importance of this vaccine but patient still declined. Advised may receive this vaccine at local pharmacy or Health Dept. Aware to provide a copy of the vaccination record if obtained from local pharmacy or Health Dept. Verbalized acceptance and understanding.  Pneumococcal vaccine status: Declined,  Education has been provided regarding the importance of this vaccine but patient still declined. Advised may receive this vaccine at local pharmacy or Health Dept. Aware to provide a copy of the vaccination record if obtained from local pharmacy or Health Dept. Verbalized acceptance and understanding.   Covid-19 vaccine status: Declined, Education has been provided regarding the importance of this vaccine but patient still declined. Advised may receive this vaccine at local pharmacy or Health Dept.or vaccine clinic. Aware to provide a copy of the vaccination record if obtained from local pharmacy or Health Dept. Verbalized acceptance and understanding.  Qualifies for Shingles Vaccine? No   due age 65  Screening Tests Health Maintenance  Topic Date Due   MAMMOGRAM  Never done   COLONOSCOPY (Pts  45-65yrs Insurance coverage will need to be confirmed)  Never done   COVID-19 Vaccine (1) 06/02/2021 (Originally 04/28/1971)   INFLUENZA VACCINE  09/08/2021 (Originally 01/09/2021)   TETANUS/TDAP  02/22/2022 (Originally 10/27/1991)   Hepatitis C Screening  02/22/2022 (Originally 10/27/1990)   Pneumococcal Vaccine 75-9 Years old (1 - PCV) 05/17/2022 (Originally 10/27/1978)   PAP SMEAR-Modifier  10/19/2022   HIV Screening  Completed   HPV VACCINES  Aged Out    Health Maintenance  Health Maintenance Due  Topic Date Due   MAMMOGRAM  Never done   COLONOSCOPY (Pts 45-37yrs Insurance coverage will need to be confirmed)  Never done    Colorectal cancer screening: Referral to GI placed today. Pt aware the office will call re: appt.  Mammogram status: Ordered today. Pt provided with contact info and advised to call to schedule appt.   Bone density status: due age 75  Lung Cancer Screening: (Low Dose CT Chest recommended if Age 16-80 years, 30 pack-year currently smoking OR have quit w/in 15years.) does not qualify.   Additional Screening:  Hepatitis C Screening: does qualify; postponed  Vision Screening: Recommended annual ophthalmology exams for early detection of glaucoma and other disorders of the eye. Is the patient up to date with their annual eye exam?  No  Who is the provider or what is the name of the office in which the patient attends annual eye exams? Not established If pt is not established with a provider, would they like to be referred to a provider to establish care? Yes .   Dental Screening: Recommended annual dental exams for proper oral hygiene  Community Resource Referral / Chronic Care Management: CRR required this visit?  No   CCM required this visit?  No      Plan:     I have personally reviewed and noted the following in the patient's chart:   Medical  and social history Use of alcohol, tobacco or illicit drugs  Current medications and supplements including  opioid prescriptions.  Functional ability and status Nutritional status Physical activity Advanced directives List of other physicians Hospitalizations, surgeries, and ER visits in previous 12 months Vitals Screenings to include cognitive, depression, and falls Referrals and appointments  In addition, I have reviewed and discussed with patient certain preventive protocols, quality metrics, and best practice recommendations. A written personalized care plan for preventive services as well as general preventive health recommendations were provided to patient.     Reather Littler, LPN   [id number]   Nurse Notes: pt states cough/cold and flu like sxs including body aches, fever and SOB. Pt was scheduled to have office visit with Dr. Yetta Barre today at 11:40 but she canceled the appt yesterday. Pt advised to contact office or go to urgent care if sxs worsen or persist. Pt states she is taking OTC mucinex, robitussin, elderberry, honey and tylenol.

## 2021-05-17 NOTE — Patient Instructions (Addendum)
Brenda Berry , Thank you for taking time to come for your Medicare Wellness Visit. I appreciate your ongoing commitment to your health goals. Please review the following plan we discussed and let me know if I can assist you in the future.   Screening recommendations/referrals: Colonoscopy: Referral sent today to [facility]. They will contact you for an appointment.  Mammogram: Please call [phone] to schedule your mammogram.  Bone Density: due age 49 Recommended yearly ophthalmology/optometry visit for glaucoma screening and checkup Recommended yearly dental visit for hygiene and checkup  Vaccinations: Influenza vaccine: declined Pneumococcal vaccine: declined Tdap vaccine: due Shingles vaccine: due age 71 Covid-19:declined  Advanced directives: Advance directive discussed with you today. Even though you declined this today please call our office should you change your mind and we can give you the proper paperwork for you to fill out.   Conditions/risks identified: If you wish to quit smoking, help is available. For free tobacco cessation program offerings call the [facility] at 361-011-8351 or Live Well Line at (918)764-9959. You may also visit [email or web address] or email [email or web address] for more information on other programs.   Next appointment: Follow up in one year for your annual wellness visit    Preventive Care 65 Years and Older, Female Preventive care refers to lifestyle choices and visits with your health care provider that can promote health and wellness. What does preventive care include? A yearly physical exam. This is also called an annual well check. Dental exams once or twice a year. Routine eye exams. Ask your health care provider how often you should have your eyes checked. Personal lifestyle choices, including: Daily care of your teeth and gums. Regular physical activity. Eating a healthy diet. Avoiding tobacco and drug  use. Limiting alcohol use. Practicing safe sex. Taking low-dose aspirin every day. Taking vitamin and mineral supplements as recommended by your health care provider. What happens during an annual well check? The services and screenings done by your health care provider during your annual well check will depend on your age, overall health, lifestyle risk factors, and family history of disease. Counseling  Your health care provider may ask you questions about your: Alcohol use. Tobacco use. Drug use. Emotional well-being. Home and relationship well-being. Sexual activity. Eating habits. History of falls. Memory and ability to understand (cognition). Work and work Astronomer. Reproductive health. Screening  You may have the following tests or measurements: Height, weight, and BMI. Blood pressure. Lipid and cholesterol levels. These may be checked every 5 years, or more frequently if you are over 51 years old. Skin check. Lung cancer screening. You may have this screening every year starting at age 35 if you have a 30-pack-year history of smoking and currently smoke or have quit within the past 15 years. Fecal occult blood test (FOBT) of the stool. You may have this test every year starting at age 24. Flexible sigmoidoscopy or colonoscopy. You may have a sigmoidoscopy every 5 years or a colonoscopy every 10 years starting at age 51. Hepatitis C blood test. Hepatitis B blood test. Sexually transmitted disease (STD) testing. Diabetes screening. This is done by checking your blood sugar (glucose) after you have not eaten for a while (fasting). You may have this done every 1-3 years. Bone density scan. This is done to screen for osteoporosis. You may have this done starting at age 23. Mammogram. This may be done every 1-2 years. Talk to your health care provider about how often you should have regular  mammograms. Talk with your health care provider about your test results, treatment  options, and if necessary, the need for more tests. Vaccines  Your health care provider may recommend certain vaccines, such as: Influenza vaccine. This is recommended every year. Tetanus, diphtheria, and acellular pertussis (Tdap, Td) vaccine. You may need a Td booster every 10 years. Zoster vaccine. You may need this after age 53. Pneumococcal 13-valent conjugate (PCV13) vaccine. One dose is recommended after age 82. Pneumococcal polysaccharide (PPSV23) vaccine. One dose is recommended after age 82. Talk to your health care provider about which screenings and vaccines you need and how often you need them. This information is not intended to replace advice given to you by your health care provider. Make sure you discuss any questions you have with your health care provider. Document Released: 06/24/2015 Document Revised: 02/15/2016 Document Reviewed: 03/29/2015 Elsevier Interactive Patient Education  2017 [place] Prevention in the Home Falls can cause injuries. They can happen to people of all ages. There are many things you can do to make your home safe and to help prevent falls. What can I do on the outside of my home? Regularly fix the edges of walkways and driveways and fix any cracks. Remove anything that might make you trip as you walk through a door, such as a raised step or threshold. Trim any bushes or trees on the path to your home. Use bright outdoor lighting. Clear any walking paths of anything that might make someone trip, such as rocks or tools. Regularly check to see if handrails are loose or broken. Make sure that both sides of any steps have handrails. Any raised decks and porches should have guardrails on the edges. Have any leaves, snow, or ice cleared regularly. Use sand or salt on walking paths during winter. Clean up any spills in your garage right away. This includes oil or grease spills. What can I do in the bathroom? Use night lights. Install grab  bars by the toilet and in the tub and shower. Do not use towel bars as grab bars. Use non-skid mats or decals in the tub or shower. If you need to sit down in the shower, use a plastic, non-slip stool. Keep the floor dry. Clean up any water that spills on the floor as soon as it happens. Remove soap buildup in the tub or shower regularly. Attach bath mats securely with double-sided non-slip rug tape. Do not have throw rugs and other things on the floor that can make you trip. What can I do in the bedroom? Use night lights. Make sure that you have a light by your bed that is easy to reach. Do not use any sheets or blankets that are too big for your bed. They should not hang down onto the floor. Have a firm chair that has side arms. You can use this for support while you get dressed. Do not have throw rugs and other things on the floor that can make you trip. What can I do in the kitchen? Clean up any spills right away. Avoid walking on wet floors. Keep items that you use a lot in easy-to-reach places. If you need to reach something above you, use a strong step stool that has a grab bar. Keep electrical cords out of the way. Do not use floor polish or wax that makes floors slippery. If you must use wax, use non-skid floor wax. Do not have throw rugs and other things on the floor that can  make you trip. What can I do with my stairs? Do not leave any items on the stairs. Make sure that there are handrails on both sides of the stairs and use them. Fix handrails that are broken or loose. Make sure that handrails are as long as the stairways. Check any carpeting to make sure that it is firmly attached to the stairs. Fix any carpet that is loose or worn. Avoid having throw rugs at the top or bottom of the stairs. If you do have throw rugs, attach them to the floor with carpet tape. Make sure that you have a light switch at the top of the stairs and the bottom of the stairs. If you do not have them,  ask someone to add them for you. What else can I do to help prevent falls? Wear shoes that: Do not have high heels. Have rubber bottoms. Are comfortable and fit you well. Are closed at the toe. Do not wear sandals. If you use a stepladder: Make sure that it is fully opened. Do not climb a closed stepladder. Make sure that both sides of the stepladder are locked into place. Ask someone to hold it for you, if possible. Clearly mark and make sure that you can see: Any grab bars or handrails. First and last steps. Where the edge of each step is. Use tools that help you move around (mobility aids) if they are needed. These include: Canes. Walkers. Scooters. Crutches. Turn on the lights when you go into a dark area. Replace any light bulbs as soon as they burn out. Set up your furniture so you have a clear path. Avoid moving your furniture around. If any of your floors are uneven, fix them. If there are any pets around you, be aware of where they are. Review your medicines with your doctor. Some medicines can make you feel dizzy. This can increase your chance of falling. Ask your doctor what other things that you can do to help prevent falls. This information is not intended to replace advice given to you by your health care provider. Make sure you discuss any questions you have with your health care provider. Document Released: 03/24/2009 Document Revised: 11/03/2015 Document Reviewed: 07/02/2014 Elsevier Interactive Patient Education  2017 [employer].

## 2021-05-18 NOTE — Progress Notes (Unsigned)
I have reviewed the health advisor's note, was available for consultation, and agree with the documentation and plans.  Hugh Garrow MD  

## 2021-05-24 ENCOUNTER — Telehealth: Payer: Self-pay

## 2021-05-24 NOTE — Telephone Encounter (Signed)
Called and left message that pt needs to get mammo scheduled. Offered to do so if she needs help

## 2021-05-25 ENCOUNTER — Telehealth: Payer: Self-pay

## 2021-05-25 NOTE — Telephone Encounter (Signed)
CALLED PATIENT NO ANSWER LEFT VOICEMAIL FOR A CALL BACK ? ?

## 2021-06-07 ENCOUNTER — Telehealth: Payer: Self-pay

## 2021-06-07 NOTE — Telephone Encounter (Signed)
° °  Telephone encounter was:  Successful.  06/07/2021 Name: Brenda Berry MRN: [id number] DOB: 1971-05-13  Brenda Berry is a 50 y.o. year old female who is a primary care patient of Duanne Limerick, MD . The community resource team was consulted for assistance with Food Insecurity and housing and medical expenses  Care guide performed the following interventions: Spoke with patient verified her email address, sent county resource guide with information for housing, food and medical expenses.  Follow Up Plan:  Care guide will follow up with patient by phone over the next 7-10 days  Thersia Petraglia, AAS Paralegal, [facility] Care Guide  Embedded Care Coordination [facility]   Care Management  [address] [place], [place] [address] ??[email or web address]   ?? [phone]   [email or web address]

## 2021-06-08 ENCOUNTER — Telehealth: Payer: Self-pay

## 2021-06-08 NOTE — Telephone Encounter (Signed)
° °  Telephone encounter was:  Successful.  06/08/2021 Name: Brenda Berry MRN: [id number] DOB: 1970/11/01  Brenda Berry is a 50 y.o. year old female who is a primary care patient of Duanne Limerick, MD . The community resource team was consulted for assistance with financial/health insurance.  Care guide performed the following interventions: Received email confirmation from patient that she has received information for [employer].  Follow Up Plan:  No further follow up planned at this time. The patient has been provided with needed resources.  Cenia Zaragosa, AAS Paralegal, [facility] Care Guide  Embedded Care Coordination [place]   Care Management  [address] [place], [place] [address] ??[email or web address]   ?? [phone]   [email or web address]

## 2021-06-13 DIAGNOSIS — Z79899 Other long term (current) drug therapy: Secondary | ICD-10-CM | POA: Diagnosis not present

## 2021-06-13 DIAGNOSIS — G8929 Other chronic pain: Secondary | ICD-10-CM | POA: Diagnosis not present

## 2021-06-13 DIAGNOSIS — M545 Low back pain, unspecified: Secondary | ICD-10-CM | POA: Diagnosis not present

## 2021-06-13 DIAGNOSIS — M542 Cervicalgia: Secondary | ICD-10-CM | POA: Diagnosis not present

## 2021-06-13 DIAGNOSIS — M5430 Sciatica, unspecified side: Secondary | ICD-10-CM | POA: Diagnosis not present

## 2021-07-05 ENCOUNTER — Other Ambulatory Visit: Payer: Self-pay | Admitting: Family Medicine

## 2021-07-05 DIAGNOSIS — J4521 Mild intermittent asthma with (acute) exacerbation: Secondary | ICD-10-CM

## 2021-07-05 NOTE — Telephone Encounter (Signed)
Requested Prescriptions  Pending Prescriptions Disp Refills   albuterol (VENTOLIN HFA) 108 (90 Base) MCG/ACT inhaler [Pharmacy Med Name: ALBUTEROL HFA (PROAIR) INHALER] 8.5 each 2    Sig: TAKE 2 PUFFS BY MOUTH EVERY 6 HOURS AS NEEDED FOR WHEEZE OR SHORTNESS OF BREATH     Pulmonology:  Beta Agonists Failed - 07/05/2021  1:34 AM      Failed - One inhaler should last at least one month. If the patient is requesting refills earlier, contact the patient to check for uncontrolled symptoms.      Passed - Valid encounter within last 12 months    Recent Outpatient Visits          4 months ago Hospital discharge follow-up   [facility] Duanne Limerick, MD   2 years ago Esophageal dysphagia   [facility] Duanne Limerick, MD   2 years ago Acute sinusitis, recurrence not specified, unspecified location   [facility] Duanne Limerick, MD   6 years ago Reactive airway disease with acute exacerbation   [facility] Duanne Limerick, MD

## 2021-07-11 DIAGNOSIS — M542 Cervicalgia: Secondary | ICD-10-CM | POA: Diagnosis not present

## 2021-07-11 DIAGNOSIS — M5430 Sciatica, unspecified side: Secondary | ICD-10-CM | POA: Diagnosis not present

## 2021-07-11 DIAGNOSIS — M545 Low back pain, unspecified: Secondary | ICD-10-CM | POA: Diagnosis not present

## 2021-07-11 DIAGNOSIS — Z79899 Other long term (current) drug therapy: Secondary | ICD-10-CM | POA: Diagnosis not present

## 2021-07-11 DIAGNOSIS — G8929 Other chronic pain: Secondary | ICD-10-CM | POA: Diagnosis not present

## 2021-08-09 DIAGNOSIS — M542 Cervicalgia: Secondary | ICD-10-CM | POA: Diagnosis not present

## 2021-08-09 DIAGNOSIS — G8929 Other chronic pain: Secondary | ICD-10-CM | POA: Diagnosis not present

## 2021-08-09 DIAGNOSIS — Z79899 Other long term (current) drug therapy: Secondary | ICD-10-CM | POA: Diagnosis not present

## 2021-08-09 DIAGNOSIS — M5430 Sciatica, unspecified side: Secondary | ICD-10-CM | POA: Diagnosis not present

## 2021-08-09 DIAGNOSIS — M545 Low back pain, unspecified: Secondary | ICD-10-CM | POA: Diagnosis not present

## 2021-09-07 DIAGNOSIS — M542 Cervicalgia: Secondary | ICD-10-CM | POA: Diagnosis not present

## 2021-09-07 DIAGNOSIS — G8929 Other chronic pain: Secondary | ICD-10-CM | POA: Diagnosis not present

## 2021-09-07 DIAGNOSIS — M545 Low back pain, unspecified: Secondary | ICD-10-CM | POA: Diagnosis not present

## 2021-09-07 DIAGNOSIS — M5430 Sciatica, unspecified side: Secondary | ICD-10-CM | POA: Diagnosis not present

## 2021-09-07 DIAGNOSIS — Z79899 Other long term (current) drug therapy: Secondary | ICD-10-CM | POA: Diagnosis not present

## 2021-09-30 ENCOUNTER — Other Ambulatory Visit: Payer: Self-pay | Admitting: Family Medicine

## 2021-09-30 DIAGNOSIS — J4521 Mild intermittent asthma with (acute) exacerbation: Secondary | ICD-10-CM

## 2021-10-02 NOTE — Telephone Encounter (Signed)
Requested Prescriptions  ?Pending Prescriptions Disp Refills  ?? albuterol (VENTOLIN HFA) 108 (90 Base) MCG/ACT inhaler [Pharmacy Med Name: ALBUTEROL HFA (PROAIR) INHALER] 8.5 each 2  ?  Sig: TAKE 2 PUFFS BY MOUTH EVERY 6 HOURS AS NEEDED FOR WHEEZE OR SHORTNESS OF BREATH  ?  ? Pulmonology:  Beta Agonists 2 Passed - 09/30/2021  1:26 AM  ?  ?  Passed - Last BP in normal range  ?  BP Readings from Last 1 Encounters:  ?02/22/21 120/72  ?   ?  ?  Passed - Last Heart Rate in normal range  ?  Pulse Readings from Last 1 Encounters:  ?02/22/21 (!) 102  ?   ?  ?  Passed - Valid encounter within last 12 months  ?  Recent Outpatient Visits   ?      ? 7 months ago Hospital discharge follow-up  ? [facility] Duanne Limerick, MD  ? 2 years ago Esophageal dysphagia  ? [facility] Duanne Limerick, MD  ? 3 years ago Acute sinusitis, recurrence not specified, unspecified location  ? [facility] Duanne Limerick, MD  ? 6 years ago Reactive airway disease with acute exacerbation  ? [facility] Duanne Limerick, MD  ?  ?  ? ?  ?  ?  ? ?

## 2021-10-06 DIAGNOSIS — M545 Low back pain, unspecified: Secondary | ICD-10-CM | POA: Diagnosis not present

## 2021-10-06 DIAGNOSIS — G8929 Other chronic pain: Secondary | ICD-10-CM | POA: Diagnosis not present

## 2021-10-06 DIAGNOSIS — Z79899 Other long term (current) drug therapy: Secondary | ICD-10-CM | POA: Diagnosis not present

## 2021-10-06 DIAGNOSIS — M542 Cervicalgia: Secondary | ICD-10-CM | POA: Diagnosis not present

## 2021-10-06 DIAGNOSIS — M5430 Sciatica, unspecified side: Secondary | ICD-10-CM | POA: Diagnosis not present

## 2021-10-18 DIAGNOSIS — M542 Cervicalgia: Secondary | ICD-10-CM | POA: Diagnosis not present

## 2021-10-18 DIAGNOSIS — M47816 Spondylosis without myelopathy or radiculopathy, lumbar region: Secondary | ICD-10-CM | POA: Diagnosis not present

## 2021-11-06 ENCOUNTER — Other Ambulatory Visit: Payer: Self-pay | Admitting: Family Medicine

## 2021-11-06 DIAGNOSIS — M542 Cervicalgia: Secondary | ICD-10-CM | POA: Diagnosis not present

## 2021-11-06 DIAGNOSIS — M5430 Sciatica, unspecified side: Secondary | ICD-10-CM | POA: Diagnosis not present

## 2021-11-06 DIAGNOSIS — M545 Low back pain, unspecified: Secondary | ICD-10-CM | POA: Diagnosis not present

## 2021-11-06 DIAGNOSIS — G8929 Other chronic pain: Secondary | ICD-10-CM | POA: Diagnosis not present

## 2021-11-06 DIAGNOSIS — G2581 Restless legs syndrome: Secondary | ICD-10-CM

## 2021-11-06 DIAGNOSIS — Z79899 Other long term (current) drug therapy: Secondary | ICD-10-CM | POA: Diagnosis not present

## 2021-11-08 DIAGNOSIS — Z79899 Other long term (current) drug therapy: Secondary | ICD-10-CM | POA: Diagnosis not present

## 2021-11-27 ENCOUNTER — Other Ambulatory Visit: Payer: Self-pay

## 2021-11-27 ENCOUNTER — Emergency Department: Payer: Medicare Other

## 2021-11-27 ENCOUNTER — Emergency Department
Admission: EM | Admit: 2021-11-27 | Discharge: 2021-11-27 | Disposition: A | Discharge status: Home / Self Care | Payer: Medicare Other | Attending: Emergency Medicine | Admitting: Emergency Medicine

## 2021-11-27 ENCOUNTER — Encounter: Payer: Self-pay | Admitting: Emergency Medicine

## 2021-11-27 DIAGNOSIS — M5126 Other intervertebral disc displacement, lumbar region: Secondary | ICD-10-CM | POA: Diagnosis not present

## 2021-11-27 DIAGNOSIS — R531 Weakness: Secondary | ICD-10-CM | POA: Diagnosis not present

## 2021-11-27 DIAGNOSIS — R109 Unspecified abdominal pain: Secondary | ICD-10-CM | POA: Diagnosis not present

## 2021-11-27 DIAGNOSIS — Z90710 Acquired absence of both cervix and uterus: Secondary | ICD-10-CM | POA: Insufficient documentation

## 2021-11-27 DIAGNOSIS — R519 Headache, unspecified: Secondary | ICD-10-CM | POA: Diagnosis not present

## 2021-11-27 DIAGNOSIS — M545 Low back pain, unspecified: Secondary | ICD-10-CM | POA: Diagnosis not present

## 2021-11-27 DIAGNOSIS — M549 Dorsalgia, unspecified: Secondary | ICD-10-CM | POA: Diagnosis present

## 2021-11-27 DIAGNOSIS — G8929 Other chronic pain: Secondary | ICD-10-CM | POA: Diagnosis not present

## 2021-11-27 DIAGNOSIS — R9431 Abnormal electrocardiogram [ECG] [EKG]: Secondary | ICD-10-CM | POA: Diagnosis not present

## 2021-11-27 DIAGNOSIS — M48061 Spinal stenosis, lumbar region without neurogenic claudication: Secondary | ICD-10-CM | POA: Diagnosis not present

## 2021-11-27 LAB — CBC WITH DIFFERENTIAL/PLATELET
Abs Immature Granulocytes: 0.02 10*3/uL (ref 0.00–0.07)
Basophils Absolute: 0.1 10*3/uL (ref 0.0–0.1)
Basophils Relative: 1 %
Eosinophils Absolute: 0.1 10*3/uL (ref 0.0–0.5)
Eosinophils Relative: 1 %
HCT: 41.1 % (ref 36.0–46.0)
Hemoglobin: 13.7 g/dL (ref 12.0–15.0)
Immature Granulocytes: 0 %
Lymphocytes Relative: 29 %
Lymphs Abs: 2.6 10*3/uL (ref 0.7–4.0)
MCH: 30.6 pg (ref 26.0–34.0)
MCHC: 33.3 g/dL (ref 30.0–36.0)
MCV: 91.7 fL (ref 80.0–100.0)
Monocytes Absolute: 0.4 10*3/uL (ref 0.1–1.0)
Monocytes Relative: 5 %
Neutro Abs: 5.8 10*3/uL (ref 1.7–7.7)
Neutrophils Relative %: 64 %
Platelets: 310 10*3/uL (ref 150–400)
RBC: 4.48 MIL/uL (ref 3.87–5.11)
RDW: 11.9 % (ref 11.5–15.5)
WBC: 9 10*3/uL (ref 4.0–10.5)
nRBC: 0 % (ref 0.0–0.2)

## 2021-11-27 LAB — URINALYSIS, ROUTINE W REFLEX MICROSCOPIC
Bilirubin Urine: NEGATIVE
Glucose, UA: NEGATIVE mg/dL
Ketones, ur: NEGATIVE mg/dL
Leukocytes,Ua: NEGATIVE
Nitrite: NEGATIVE
Protein, ur: NEGATIVE mg/dL
Specific Gravity, Urine: 1.003 — ABNORMAL LOW (ref 1.005–1.030)
pH: 6 (ref 5.0–8.0)

## 2021-11-27 LAB — COMPREHENSIVE METABOLIC PANEL
ALT: 15 U/L (ref 0–44)
AST: 20 U/L (ref 15–41)
Albumin: 3.9 g/dL (ref 3.5–5.0)
Alkaline Phosphatase: 74 U/L (ref 38–126)
Anion gap: 8 (ref 5–15)
BUN: 8 mg/dL (ref 6–20)
CO2: 25 mmol/L (ref 22–32)
Calcium: 9.5 mg/dL (ref 8.9–10.3)
Chloride: 107 mmol/L (ref 98–111)
Creatinine, Ser: 0.71 mg/dL (ref 0.44–1.00)
GFR, Estimated: >60 mL/min (ref >60)
Glucose, Bld: 121 mg/dL — ABNORMAL HIGH (ref 70–99)
Potassium: 4.3 mmol/L (ref 3.5–5.1)
Sodium: 140 mmol/L (ref 135–145)
Total Bilirubin: 0.5 mg/dL (ref 0.3–1.2)
Total Protein: 7.5 g/dL (ref 6.5–8.1)

## 2021-11-27 LAB — SEDIMENTATION RATE: Sed Rate: 15 mm/hr (ref 0–20)

## 2021-11-27 MED ORDER — HYDROMORPHONE HCL 1 MG/ML IJ SOLN
1.0000 mg | Freq: Once | INTRAMUSCULAR | Status: AC
  Administered 2021-11-27: 1 mg via INTRAVENOUS
  Filled 2021-11-27: qty 1

## 2021-11-27 MED ORDER — GADOBUTROL 1 MMOL/ML IV SOLN
7.5000 mL | Freq: Once | INTRAVENOUS | Status: AC | PRN
  Administered 2021-11-27: 7.5 mL via INTRAVENOUS

## 2021-11-27 MED ORDER — OXYCODONE-ACETAMINOPHEN 5-325 MG PO TABS
1.0000 | ORAL_TABLET | Freq: Once | ORAL | Status: AC
  Administered 2021-11-27: 1 via ORAL
  Filled 2021-11-27: qty 1

## 2021-11-27 MED ORDER — LORAZEPAM 2 MG/ML IJ SOLN
0.5000 mg | Freq: Once | INTRAMUSCULAR | Status: AC | PRN
  Administered 2021-11-27: 0.5 mg via INTRAVENOUS
  Filled 2021-11-27: qty 1

## 2021-11-27 MED ORDER — ACETAMINOPHEN 325 MG PO TABS
650.0000 mg | ORAL_TABLET | Freq: Once | ORAL | Status: AC
  Administered 2021-11-27: 650 mg via ORAL
  Filled 2021-11-27: qty 2

## 2021-11-27 MED ORDER — OXYCODONE HCL 5 MG PO TABS
5.0000 mg | ORAL_TABLET | ORAL | Status: AC
  Administered 2021-11-27: 5 mg via ORAL
  Filled 2021-11-27: qty 1

## 2021-11-27 MED ORDER — PREDNISONE 10 MG (21) PO TBPK: ORAL_TABLET | ORAL | 0 refills | Status: DC

## 2021-11-27 MED ORDER — LIDOCAINE 5 % EX PTCH: 1.0000 | MEDICATED_PATCH | Freq: Two times a day (BID) | CUTANEOUS | 0 refills | Status: AC

## 2021-11-27 MED ORDER — LIDOCAINE 5 % EX PTCH: 1.0000 | MEDICATED_PATCH | Freq: Two times a day (BID) | CUTANEOUS | 0 refills | Status: DC

## 2021-11-27 MED ORDER — OXYCODONE HCL 5 MG PO TABS: 5.0000 mg | ORAL_TABLET | Freq: Three times a day (TID) | ORAL | 0 refills | Status: DC | PRN

## 2021-11-27 MED ORDER — LIDOCAINE 5 % EX PTCH
1.0000 | MEDICATED_PATCH | CUTANEOUS | Status: DC
  Administered 2021-11-27: 1 via TRANSDERMAL
  Filled 2021-11-27: qty 1

## 2021-11-27 MED ORDER — TIZANIDINE HCL 2 MG PO TABS
4.0000 mg | ORAL_TABLET | Freq: Once | ORAL | Status: AC
  Administered 2021-11-27: 4 mg via ORAL
  Filled 2021-11-27: qty 2

## 2021-11-27 NOTE — ED Notes (Signed)
See triage note  Presents with lower back pain which moves into both legs   Pain is worse on the right Denies any recent injury. States she has a hx of chronic back pain and goes to pain management   next appt is next week  describes pain as "burning"

## 2021-11-27 NOTE — Discharge Instructions (Signed)
Your MRI showed: As we discussed while I cannot definitively rule out infection just patient imaging given you have no fever, rash, elevated white blood cell count elevation of an inflammatory marker called CRP or history of any recent injections I think this is very unlikely.  I suspect this is worsening of your stenosis.  As we discussed I will provide short course of increased opioid to take and see her pain doctor.  Please return for any new or worsening of your symptoms.  IMPRESSION: 1. T2 hyperintense signal and minimal enhancement at the right posteroinferior endplate of L4, likely edema, which are favored to be degenerative and inflammatory but could represent early discitis osteomyelitis. Similarly, increased T2 signal and enhancement are seen in the posterior aspect of the L4-L5 disc, favored to be related to an annular fissure, new compared to 2021, but possibly representing discitis. Correlate with lab values. 2. L4-L5 mild spinal canal stenosis with narrowing of the lateral recesses, which could affect the descending L5 nerve roots. In addition, the central annular fissure could irritate the descending L5 nerve roots. 3. L3-L4 mild right neural foraminal narrowing and borderline spinal canal stenosis.

## 2021-11-27 NOTE — ED Triage Notes (Signed)
Pt here with back pain since Fri afternoon. Pt stapes her pain is in the middle of her spine traveling down her back. Pt also c/o of a severe headache. Pt states it hurts a lot to ambulate with the pain, pt describes as a shooting pain.

## 2021-11-27 NOTE — ED Provider Notes (Signed)
I assumed care of this patient approximately 1500.  Please see outgoing providers note for full details regarding patient's initial evaluation assessment.  In brief patient presents with a history of chronic back pain on chronic opioids and tizanidine who presents for evaluation of some acute on chronic low back pain that occurred when she was getting out of a car earlier today and felt a pop in her lower back.  She did endorse some numbness and urinated herself was not clear if this was from incontinence or because she could not make it to the bathroom.  At time of signout she is pending a CT head as well as CT abdomen pelvis to assess for any evidence of a kidney stone or intracranial abnormality as well as an MRI.   MRI interpretation shows fairly significant stenotic disease in the L4-L5 region.  I reviewed radiology interpretation and agree their findings of a T2 hyperintense signal in the posterior endplate of L4 likely edema possibly degenerative but also possibly inflammatory.  Radiology notes that cannot exclude infectious process.  I think this is much less likely given absence of fever, leukocytosis and nonelevated ESR.  I also discussed this with on-call neurosurgeon Dr. Cari Caraway who reviewed imaging and felt this was unlikely.  Patient was observed to ambulate emergency room and my assessment has 2+ bilateral patellar reflexes.  I suspect her pain is from some acute on chronic stenosis and nerve root compression.  I have a low suspicion for central cord or cauda equina compression or acute infectious process at this time.  At this point think patient is stable for discharge with continued outpatient evaluation by her PCP and pain specialist.  We will provide a short course of increased opiate analgesia until she can get into see her pain physician.  Discharged in stable condition.  Strict return precautions advised and discussed   Lucrezia Starch, MD 11/27/21 2130

## 2021-11-27 NOTE — ED Provider Notes (Signed)
[facility] Provider Note    Event Date/Time   First MD Initiated Contact with Patient 11/27/21 1317     (approximate)   History   Back Pain   HPI  Brenda Berry is a 51 y.o. female with history of chronic back pain requiring surgeries who comes in with severe worsening of back pain.  Patient reports that she was getting out of the car when she felt something twinge in her back and since then has had severe pain.  She does report some numbness in her bottom area and some urinating on herself associated with it.  But she is not sure if that is just from not being able to get to the bathroom due to the pain.  She denies any new leg weakness but does report a little bit of tingling in her legs but that seems more chronic in nature.  She reports that she is on oxycodone, tizanidine and was going to follow-up with her back pain doctor but does not have an appointment till next week.  She denies any abdominal pain, urinary symptoms.  She reports not having a little bit of headache but she feels this is more like from the pain.  She did not fall to the ground or hit her head.  I reviewed patient's consult from May 2023 where she was seen by pain medicine.   Physical Exam   Triage Vital Signs: ED Triage Vitals [11/27/21 1121]  Enc Vitals Group     BP (!) 145/108     Pulse Rate (!) 135     Resp 20     Temp 98.6 F (37 C)     Temp Source Oral     SpO2 98 %     Weight 175 lb (79.4 kg)     Height 5\' 2"  (1.575 m)     Head Circumference      Peak Flow      Pain Score 10     Pain Loc      Pain Edu?      Excl. in GC?     Most recent vital signs: Vitals:   11/27/21 1121  BP: (!) 145/108  Pulse: (!) 135  Resp: 20  Temp: 98.6 F (37 C)  SpO2: 98%     General: Awake, no distress.  CV:  Good peripheral perfusion.  Resp:  Normal effort.  Abd:  No distention.  Other:  No CT spine tenderness.  Little L-spine tenderness with significant more right  paraspinal tenderness.  She is got good strength in her legs but some tingling in the Right    ED Results / Procedures / Treatments   Labs (all labs ordered are listed, but only abnormal results are displayed) Labs Reviewed  URINALYSIS, ROUTINE W REFLEX MICROSCOPIC - Abnormal; Notable for the following components:      Result Value   Color, Urine STRAW (*)    APPearance CLEAR (*)    Specific Gravity, Urine 1.003 (*)    Hgb urine dipstick SMALL (*)    Bacteria, UA RARE (*)    All other components within normal limits  CBC WITH DIFFERENTIAL/PLATELET  COMPREHENSIVE METABOLIC PANEL     EKG  My interpretation of EKG:  Normal sinus rate of 84 without any ST elevation, T wave version lead III and V3, normal intervals.  Reviewed her prior EKG from 01/2021 and she had similar T wave inversion  RADIOLOGY MRI pending  PROCEDURES:  Critical Care performed:  No  Procedures   MEDICATIONS ORDERED IN ED: Medications  HYDROmorphone (DILAUDID) injection 1 mg (has no administration in time range)  lidocaine (LIDODERM) 5 % 1 patch (has no administration in time range)     IMPRESSION / MDM / ASSESSMENT AND PLAN / ED COURSE  I reviewed the triage vital signs and the nursing notes.   Patient's presentation is most consistent with acute presentation with potential threat to life or bodily function.   Differential: herniated disc causing cord compression, sciatica- Abd soft and non tender unlikely abdominal issue.  We will give patient a 1 milligram of IV Dilaudid and get MRI to further evaluate.  CBC reassuring.  CMP reassuring  UA without evidence of UTI and does not sound consistent with kidney stone or pyelonephritis  2:39 PM on reevaluation pain is improved but she still is uncomfortable.  Will give another milligram of Dilaudid.  Discussed with her that if she needs Ativan for the MRI we cannot give these at the same time so we will try to control her pain before going to MRI so  she get the Ativan for the anxiety.  Patient does report having a very severe headache that is now stating that her pain is radiating down into her groin and that she has a history of kidney stones.  We will get some CTs as well.  Offered her migraine cocktail but she states that these feel really jittery and does not make her feel well  Patient handed off to oncoming team pendingCT/ MRI but if negative suspect patient will be able to discharge with follow-up with her pain clinic and her back surgeon and start on a Sterapred taper for sciatica   FINAL CLINICAL IMPRESSION(S) / ED DIAGNOSES   Final diagnoses:  Acute midline low back pain without sciatica     Rx / DC Orders   ED Discharge Orders          Ordered    lidocaine (LIDODERM) 5 %  Every 12 hours        11/27/21 1439    predniSONE (STERAPRED UNI-PAK 21 TAB) 10 MG (21) TBPK tablet        11/27/21 1439             Note:  This document was prepared using Dragon voice recognition software and may include unintentional dictation errors.   Concha Se, MD 11/27/21 1440

## 2021-11-27 NOTE — ED Notes (Signed)
Pt states has oxycodone 5mg  at home that she has taken and not giving her relief. States she got 2 doses dilaudid and her pain eased up.  Pt states she'll take the oxycodone for  now but knows her pain will not improve. Informed pt as she has been dealing with back pain for a while, she might not have zero pain, however the goal is to ease up her pain levels

## 2021-11-27 NOTE — ED Notes (Signed)
Pt discharge information reviewed. Pt understands need for follow up care and when to return if symptoms worsen. All questions answered. Pt is alert and oriented with even and regular respirations. Pt is seen ambulating out of department with string steady gait.   

## 2021-11-30 ENCOUNTER — Observation Stay (HOSPITAL_COMMUNITY)
Admission: EM | Admit: 2021-11-30 | Discharge: 2021-12-02 | Disposition: A | Discharge status: Home / Self Care | Payer: Medicare Other | Attending: Family Medicine | Admitting: Family Medicine

## 2021-11-30 ENCOUNTER — Encounter (HOSPITAL_COMMUNITY): Payer: Self-pay | Admitting: Emergency Medicine

## 2021-11-30 DIAGNOSIS — M549 Dorsalgia, unspecified: Secondary | ICD-10-CM | POA: Diagnosis not present

## 2021-11-30 DIAGNOSIS — Z20822 Contact with and (suspected) exposure to covid-19: Secondary | ICD-10-CM | POA: Insufficient documentation

## 2021-11-30 DIAGNOSIS — M5416 Radiculopathy, lumbar region: Secondary | ICD-10-CM

## 2021-11-30 DIAGNOSIS — J449 Chronic obstructive pulmonary disease, unspecified: Secondary | ICD-10-CM | POA: Diagnosis present

## 2021-11-30 DIAGNOSIS — F17210 Nicotine dependence, cigarettes, uncomplicated: Secondary | ICD-10-CM | POA: Insufficient documentation

## 2021-11-30 DIAGNOSIS — G8929 Other chronic pain: Secondary | ICD-10-CM | POA: Diagnosis not present

## 2021-11-30 DIAGNOSIS — Z79899 Other long term (current) drug therapy: Secondary | ICD-10-CM | POA: Insufficient documentation

## 2021-11-30 DIAGNOSIS — R6889 Other general symptoms and signs: Secondary | ICD-10-CM

## 2021-11-30 DIAGNOSIS — Z91040 Latex allergy status: Secondary | ICD-10-CM | POA: Insufficient documentation

## 2021-11-30 DIAGNOSIS — J410 Simple chronic bronchitis: Secondary | ICD-10-CM

## 2021-11-30 DIAGNOSIS — I509 Heart failure, unspecified: Secondary | ICD-10-CM | POA: Insufficient documentation

## 2021-11-30 HISTORY — DX: Chronic obstructive pulmonary disease, unspecified: J44.9

## 2021-11-30 LAB — CBC WITH DIFFERENTIAL/PLATELET
Abs Immature Granulocytes: 0.04 10*3/uL (ref 0.00–0.07)
Basophils Absolute: 0 10*3/uL (ref 0.0–0.1)
Basophils Relative: 0 %
Eosinophils Absolute: 0 10*3/uL (ref 0.0–0.5)
Eosinophils Relative: 0 %
HCT: 43.8 % (ref 36.0–46.0)
Hemoglobin: 14.4 g/dL (ref 12.0–15.0)
Immature Granulocytes: 0 %
Lymphocytes Relative: 32 %
Lymphs Abs: 3.8 10*3/uL (ref 0.7–4.0)
MCH: 30.4 pg (ref 26.0–34.0)
MCHC: 32.9 g/dL (ref 30.0–36.0)
MCV: 92.4 fL (ref 80.0–100.0)
Monocytes Absolute: 0.6 10*3/uL (ref 0.1–1.0)
Monocytes Relative: 5 %
Neutro Abs: 7.3 10*3/uL (ref 1.7–7.7)
Neutrophils Relative %: 63 %
Platelets: 360 10*3/uL (ref 150–400)
RBC: 4.74 MIL/uL (ref 3.87–5.11)
RDW: 11.9 % (ref 11.5–15.5)
WBC: 11.7 10*3/uL — ABNORMAL HIGH (ref 4.0–10.5)
nRBC: 0 % (ref 0.0–0.2)

## 2021-11-30 LAB — COMPREHENSIVE METABOLIC PANEL
ALT: 16 U/L (ref 0–44)
AST: 14 U/L — ABNORMAL LOW (ref 15–41)
Albumin: 4.1 g/dL (ref 3.5–5.0)
Alkaline Phosphatase: 72 U/L (ref 38–126)
Anion gap: 12 (ref 5–15)
BUN: 7 mg/dL (ref 6–20)
CO2: 25 mmol/L (ref 22–32)
Calcium: 9.8 mg/dL (ref 8.9–10.3)
Chloride: 105 mmol/L (ref 98–111)
Creatinine, Ser: 0.94 mg/dL (ref 0.44–1.00)
GFR, Estimated: >60 mL/min (ref >60)
Glucose, Bld: 104 mg/dL — ABNORMAL HIGH (ref 70–99)
Potassium: 3.7 mmol/L (ref 3.5–5.1)
Sodium: 142 mmol/L (ref 135–145)
Total Bilirubin: 0.6 mg/dL (ref 0.3–1.2)
Total Protein: 7.3 g/dL (ref 6.5–8.1)

## 2021-11-30 LAB — URINALYSIS, ROUTINE W REFLEX MICROSCOPIC
Bacteria, UA: NONE SEEN
Bilirubin Urine: NEGATIVE
Glucose, UA: NEGATIVE mg/dL
Ketones, ur: NEGATIVE mg/dL
Leukocytes,Ua: NEGATIVE
Nitrite: NEGATIVE
Protein, ur: NEGATIVE mg/dL
Specific Gravity, Urine: 1.005 (ref 1.005–1.030)
pH: 7 (ref 5.0–8.0)

## 2021-11-30 LAB — LACTIC ACID, PLASMA
Lactic Acid, Venous: 2.2 mmol/L (ref 0.5–1.9)
Lactic Acid, Venous: 2.3 mmol/L (ref 0.5–1.9)

## 2021-11-30 LAB — SEDIMENTATION RATE: Sed Rate: 10 mm/hr (ref 0–22)

## 2021-11-30 LAB — SARS CORONAVIRUS 2 BY RT PCR: SARS Coronavirus 2 by RT PCR: NEGATIVE

## 2021-11-30 MED ORDER — SODIUM CHLORIDE 0.9 % IV BOLUS: 1000.0000 mL | Freq: Once | INTRAVENOUS | Status: DC

## 2021-11-30 MED ORDER — SODIUM CHLORIDE 0.9 % IV BOLUS
1000.0000 mL | Freq: Once | INTRAVENOUS | Status: AC
Start: 2021-11-30 — End: 2021-11-30
  Administered 2021-11-30: 1000 mL via INTRAVENOUS

## 2021-11-30 MED ORDER — PIPERACILLIN-TAZOBACTAM 3.375 G IVPB 30 MIN
3.3750 g | Freq: Once | INTRAVENOUS | Status: AC
  Administered 2021-11-30: 3.375 g via INTRAVENOUS
  Filled 2021-11-30: qty 50

## 2021-11-30 MED ORDER — VANCOMYCIN HCL IN DEXTROSE 1-5 GM/200ML-% IV SOLN: 1000.0000 mg | INTRAVENOUS | Status: DC

## 2021-11-30 MED ORDER — PIPERACILLIN-TAZOBACTAM 3.375 G IVPB: 3.3750 g | Freq: Three times a day (TID) | INTRAVENOUS | Status: DC

## 2021-11-30 MED ORDER — HYDROMORPHONE HCL 1 MG/ML IJ SOLN
1.0000 mg | Freq: Once | INTRAMUSCULAR | Status: AC
  Administered 2021-11-30: 1 mg via INTRAVENOUS
  Filled 2021-11-30: qty 1

## 2021-11-30 MED ORDER — HYDROMORPHONE HCL 1 MG/ML IJ SOLN
INTRAMUSCULAR | Status: AC
  Administered 2021-11-30: 1 mg via INTRAVENOUS
  Filled 2021-11-30: qty 1

## 2021-11-30 MED ORDER — VANCOMYCIN HCL 1500 MG/300ML IV SOLN
1500.0000 mg | Freq: Once | INTRAVENOUS | Status: DC
  Administered 2021-11-30: 1500 mg via INTRAVENOUS
  Filled 2021-11-30: qty 300

## 2021-11-30 NOTE — ED Provider Triage Note (Addendum)
Emergency Medicine Provider Triage Evaluation Note  Brenda Berry , a 51 y.o. female  was evaluated in triage.  Pt complains of continued low back pain.  Evaluated with MRI at [facility] on 6/19.  MRI cannot rule out early discitis or osteomyelitis.  Symptoms not improved with pain medication and steroids.  She has increased pain with urination but no loss of control.  Pain radiates down into her right leg.  She reports temperature at home to 100.3 F.  Denies IV drug use, recent infections.  Review of Systems  Positive: Severe back pain, leg pain, low-grade fever Negative: Urinary incontinence  Physical Exam  BP (!) 141/101   Pulse (!) 123   Temp 98.4 F (36.9 C) (Oral)   Resp (!) 22   SpO2 100%  Gen:   Awake, appears very uncomfortable Resp:  Normal effort  MSK:   Moves extremities without difficulty  Other:  Healed surgical scar lower back  Medical Decision Making  Medically screening exam initiated at 5:32 PM.  Appropriate orders placed.  Brenda Berry was informed that the remainder of the evaluation will be completed by another provider, this initial triage assessment does not replace that evaluation, and the importance of remaining in the ED until their evaluation is complete.      Renne Crigler, PA-C 11/30/21 1734

## 2021-11-30 NOTE — ED Provider Notes (Incomplete)
[name] EMERGENCY DEPARTMENT Provider Note   CSN: [id number] Arrival date & time: 11/30/21  1718     History {Add pertinent medical, surgical, social history, OB history to HPI:1} Chief Complaint  Patient presents with   Osteomyelitis     Brenda Berry is a 51 y.o. female present emergency department with complaint of back pain, fevers and chills.  The patient reports that she was seen at [facility], which confirmed medical records, 3 days ago with complaint of radiculopathy and pain in her right hip and right foot.  She had an MRI of the lumbar spine done at that time, with report as noted below.  There was questionable findings of inflammation or discitis or osteomyelitis, however with reassuring labs, with discussion of on-call neurosurgeon, was felt to be less likely the case.  Patient was discharged on prednisone has been taking this at home.  She returns now reporting that she is having fevers and chills at home and worsening pain in her lower back, now in her hips which is new.  Her pain is getting worse in her lower legs as well, the right one predominantly.  It radiates down into her toe.  She denies urinary incontinence.  She had an epidural performed 2021, and then reports she had "spinal surgery performed when in my 56s."  She adamantly denies a history of IV drug use.  HPI     Home Medications Prior to Admission medications   Medication Sig Start Date End Date Taking? Authorizing Provider  acetaminophen (TYLENOL) 325 MG tablet Take 650 mg by mouth every 4 (four) hours as needed.  01/31/15   [provider]  albuterol (VENTOLIN HFA) 108 (90 Base) MCG/ACT inhaler TAKE 2 PUFFS BY MOUTH EVERY 6 HOURS AS NEEDED FOR WHEEZE OR SHORTNESS OF BREATH 10/02/21   Juline Patch, MD  bumetanide (BUMEX) 0.5 MG tablet Take 1 tablet by mouth daily. 02/10/21 02/10/22  [provider]  cholecalciferol (VITAMIN D3) 25 MCG (1000 UNIT) tablet Take 1,000  Units by mouth daily.    [provider]  diclofenac Sodium (VOLTAREN) 1 % GEL SMARTSIG:2-4 Gram(s) Topical 1-4 Times Daily 01/12/21   [provider]  estradiol (ESTRACE) 1 MG tablet TAKE 1 TABLET(1 MG) BY MOUTH DAILY Patient not taking: Reported on 05/17/2021 12/17/20   Gae Dry, MD  gabapentin (NEURONTIN) 300 MG capsule Take 2 capsules (600 mg total) by mouth 4 (four) times daily. 01/06/20   Gillis Santa, MD  hydrOXYzine (ATARAX/VISTARIL) 25 MG tablet Take 25 mg by mouth 2 (two) times daily as needed. 11/14/20   [provider]  lidocaine (LIDODERM) 5 % Place 1 patch onto the skin every 12 (twelve) hours for 5 days. Remove & Discard patch within 12 hours or as directed by MD remove for 12 hours before placing new patch 11/27/21 12/02/21  Lucrezia Starch, MD  magnesium 30 MG tablet Take 30 mg by mouth 2 (two) times daily.    [provider]  metoprolol succinate (TOPROL-XL) 25 MG 24 hr tablet Take 12.5 mg by mouth daily. 01/25/21   [provider]  nitroGLYCERIN (NITROSTAT) 0.4 MG SL tablet Place 1 tablet (0.4 mg total) under the tongue every 5 (five) minutes as needed for chest pain. 02/22/21   Juline Patch, MD  oxyCODONE (ROXICODONE) 5 MG immediate release tablet Take 1 tablet (5 mg total) by mouth every 8 (eight) hours as needed for up to 3 days for breakthrough pain. 11/27/21  11/30/21  Gilles Chiquito, MD  oxyCODONE-acetaminophen (PERCOCET/ROXICET) 5-325 MG tablet Take 1 tablet by mouth 4 (four) times daily as needed. 01/14/21   [provider]  pramipexole (MIRAPEX) 0.25 MG tablet TAKE 1 TABLET BY MOUTH AT BEDTIME. 11/07/21   Duanne Limerick, MD  predniSONE (STERAPRED UNI-PAK 21 TAB) 10 MG (21) TBPK tablet Take as packaging recommends 11/27/21   Gilles Chiquito, MD  tiZANidine (ZANAFLEX) 4 MG tablet Take 4 mg by mouth 2 (two) times daily as needed. 01/12/21   [provider]  vitamin C (ASCORBIC ACID) 500 MG tablet Take 500 mg by mouth  daily.    [provider]      Allergies    Elmiron [pentosan polysulfate], Furosemide, Ciprofloxacin, Demerol [meperidine], Ibuprofen, Iodinated contrast media, Latex, Lodine [etodolac], Nifedipine, Other, Sulfa antibiotics, and Terbutaline    Review of Systems   Review of Systems  Physical Exam Updated Vital Signs BP 131/73   Pulse 72   Temp 98.4 F (36.9 C) (Oral)   Resp 19   SpO2 100%  Physical Exam Constitutional:      General: She is not in acute distress. HENT:     Head: Normocephalic and atraumatic.  Eyes:     Conjunctiva/sclera: Conjunctivae normal.     Pupils: Pupils are equal, round, and reactive to light.  Cardiovascular:     Rate and Rhythm: Normal rate and regular rhythm.  Pulmonary:     Effort: Pulmonary effort is normal. No respiratory distress.  Abdominal:     General: There is no distension.     Tenderness: There is no abdominal tenderness.  Skin:    General: Skin is warm and dry.  Neurological:     General: No focal deficit present.     Mental Status: She is alert and oriented to person, place, and time. Mental status is at baseline.     Sensory: No sensory deficit.     Motor: No weakness.  Psychiatric:        Mood and Affect: Mood normal.        Behavior: Behavior normal.     ED Results / Procedures / Treatments   Labs (all labs ordered are listed, but only abnormal results are displayed) Labs Reviewed  CBC WITH DIFFERENTIAL/PLATELET - Abnormal; Notable for the following components:      Result Value   WBC 11.7 (*)    All other components within normal limits  COMPREHENSIVE METABOLIC PANEL - Abnormal; Notable for the following components:   Glucose, Bld 104 (*)    AST 14 (*)    All other components within normal limits  LACTIC ACID, PLASMA - Abnormal; Notable for the following components:   Lactic Acid, Venous 2.2 (*)    All other components within normal limits  LACTIC ACID, PLASMA - Abnormal; Notable for the following  components:   Lactic Acid, Venous 2.3 (*)    All other components within normal limits  URINALYSIS, ROUTINE W REFLEX MICROSCOPIC - Abnormal; Notable for the following components:   Color, Urine STRAW (*)    Hgb urine dipstick SMALL (*)    All other components within normal limits  CULTURE, BLOOD (ROUTINE X 2)  CULTURE, BLOOD (ROUTINE X 2)  SARS CORONAVIRUS 2 BY RT PCR  SEDIMENTATION RATE    EKG None  Radiology No results found.  Procedures Procedures  {Document cardiac monitor, telemetry assessment procedure when appropriate:1}  Medications Ordered in ED Medications  piperacillin-tazobactam (ZOSYN) IVPB 3.375 g (3.375 g  Intravenous New Bag/Given 11/30/21 2226)  vancomycin (VANCOREADY) IVPB 1500 mg/300 mL (has no administration in time range)  HYDROmorphone (DILAUDID) injection 1 mg (has no administration in time range)  HYDROmorphone (DILAUDID) injection 1 mg (1 mg Intravenous Given 11/30/21 2154)  sodium chloride 0.9 % bolus 1,000 mL (1,000 mLs Intravenous New Bag/Given 11/30/21 2226)    ED Course/ Medical Decision Making/ A&P                           Medical Decision Making Risk Prescription drug management.   This patient presents to the ED with concern for back pain, fever. This involves an extensive number of treatment options, and is a complaint that carries with it a high risk of complications and morbidity.  The differential diagnosis includes osteomyelitis versus discitis versus other  Patient is a recurring on exam.  She reports objective fevers at home and has some elevated white blood cell count and lactate, although she is also on prednisone.  It is not clear whether she may have a bacterial infection, this is simply a pain response.  She does appear quite uncomfortable.  Co-morbidities that complicate the patient evaluation: History of chronic pain, lumbar pain  Additional history obtained from patient's family member at bedside  External records from  outside source obtained and reviewed including [facility] records from ER visit on June 19, including CT renal stone study (no acute findings), CT head with no acute findings, and MRI of the lumbar spine as noted in history above  I ordered and personally interpreted labs.  The pertinent results include: Lactate mildly elevated, mild leukocytosis (in the setting of prednisone use).  UA without sign of infection  Do not believe the patient would benefit from recurrent emergent imaging of the lumbar spine or abdomen at this time.  The patient was maintained on a cardiac monitor.  I personally viewed and interpreted the cardiac monitored which showed an underlying rhythm of: Sinus rhythm  I have lower clinical suspicion for meningitis at this time.  She does not have photophobia.  She does have chronic headaches and complains of chronic neck pain, and suffers from cervical neck pain as well, but no acutely worsening symptoms to suggest meningitis.  I do not believe she needs an emergent lumbar puncture at this time.  I ordered medication including IV Dilaudid for pain, IV broad-spectrum antibiotics for bacteremia coverage  I spoke to the on-call neurosurgeon with [facility] neurosurgery, Dr. Maisie Fus, reviewed the patient's MRI image, and does not feel that this is consistent with discitis or osteomyelitis or spinal infection, has no further recommendations with regards to working up spinal infection at this time.  The patient would need continued neurosurgery or spine surgery follow-up as an outpatient for her lumbar radiculopathy, which does appear to be considerable source of pain.  After the interventions noted above, I reevaluated the patient and found that they have: worsened  Patient continues rigoring, shaking, reports that the Dilaudid initially improved her pain but it is returning now.  Social Determinants of Health:Patient does follow pain management and is on a pain  management regimen at home with oxycodone, gabapentin, muscle relaxers.  Dispostion:  After consideration of the diagnostic results and the patients response to treatment, I feel that the patent would benefit from medical admission for bacteremia rule out.   {Document critical care time when appropriate:1} {Document review of labs and clinical decision tools ie heart score, Chads2Vasc2  etc:1}  {Document your independent review of radiology images, and any outside records:1} {Document your discussion with family members, caretakers, and with consultants:1} {Document social determinants of health affecting pt's care:1} {Document your decision making why or why not admission, treatments were needed:1} Final Clinical Impression(s) / ED Diagnoses Final diagnoses:  None    Rx / DC Orders ED Discharge Orders     None

## 2021-11-30 NOTE — Progress Notes (Signed)
Pharmacy Antibiotic Note  Brenda Berry is a 51 y.o. female admitted on 11/30/2021 presenting with back pain, fevers and chills.  Pharmacy has been consulted for vancomycin and zosyn dosing.  Plan: Vancomycin 1500 mg IV x 1, then 1000 mg IV q 24h (eAUC 485, SCr 0.94) Zosyn 3.375g IV every 8 hours (extended 4h infusion) Monitor renal function, Cx and clinical progression to narrow Vancomycin levels as needed     Temp (24hrs), Avg:98.4 F (36.9 C), Min:98.4 F (36.9 C), Max:98.4 F (36.9 C)  Recent Labs  Lab 11/27/21 1400 11/30/21 1741 11/30/21 1742 11/30/21 2144  WBC 9.0 11.7*  --   --   CREATININE 0.71 0.94  --   --   LATICACIDVEN  --   --  2.2* 2.3*    Estimated Creatinine Clearance: 70.6 mL/min (by C-G formula based on SCr of 0.94 mg/dL).    Allergies  Allergen Reactions   Elmiron [Pentosan Polysulfate] Nausea Only    Nose bleeds   Furosemide Hives, Itching and Rash   Ciprofloxacin    Demerol [Meperidine]    Ibuprofen    Iodinated Contrast Media     Reports had SOB with last infusion of dye   Latex    Lodine [Etodolac] Hives   Nifedipine    Other Hives    Nefedipine   Sulfa Antibiotics    Terbutaline    Tape Rash    USE PAPER TAPE ONLY    Daylene Posey, PharmD Clinical Pharmacist ED Pharmacist Phone # [phone] 11/30/2021 10:55 PM

## 2021-11-30 NOTE — ED Triage Notes (Signed)
Patient here with continued lower back pain, had MRI recently that showed possible early discitis osteomyelitis. Patient came to ED due to continued pain and a fever at home. Afebrile in triage, patient is alert and oriented at this time.

## 2021-12-01 ENCOUNTER — Other Ambulatory Visit: Payer: Self-pay

## 2021-12-01 ENCOUNTER — Encounter (HOSPITAL_COMMUNITY): Payer: Self-pay | Admitting: Family Medicine

## 2021-12-01 DIAGNOSIS — M549 Dorsalgia, unspecified: Secondary | ICD-10-CM | POA: Diagnosis not present

## 2021-12-01 LAB — BASIC METABOLIC PANEL
Anion gap: 9 (ref 5–15)
BUN: 7 mg/dL (ref 6–20)
CO2: 24 mmol/L (ref 22–32)
Calcium: 8.7 mg/dL — ABNORMAL LOW (ref 8.9–10.3)
Chloride: 105 mmol/L (ref 98–111)
Creatinine, Ser: 0.94 mg/dL (ref 0.44–1.00)
GFR, Estimated: >60 mL/min (ref >60)
Glucose, Bld: 97 mg/dL (ref 70–99)
Potassium: 3.3 mmol/L — ABNORMAL LOW (ref 3.5–5.1)
Sodium: 138 mmol/L (ref 135–145)

## 2021-12-01 LAB — CBC
HCT: 37.1 % (ref 36.0–46.0)
Hemoglobin: 12.3 g/dL (ref 12.0–15.0)
MCH: 30.5 pg (ref 26.0–34.0)
MCHC: 33.2 g/dL (ref 30.0–36.0)
MCV: 92.1 fL (ref 80.0–100.0)
Platelets: 284 10*3/uL (ref 150–400)
RBC: 4.03 MIL/uL (ref 3.87–5.11)
RDW: 11.9 % (ref 11.5–15.5)
WBC: 13.7 10*3/uL — ABNORMAL HIGH (ref 4.0–10.5)
nRBC: 0 % (ref 0.0–0.2)

## 2021-12-01 LAB — LACTIC ACID, PLASMA: Lactic Acid, Venous: 1.2 mmol/L (ref 0.5–1.9)

## 2021-12-01 MED ORDER — LIDOCAINE 5 % EX PTCH
1.0000 | MEDICATED_PATCH | Freq: Two times a day (BID) | CUTANEOUS | Status: DC
  Administered 2021-12-01 – 2021-12-02 (×4): 1 via TRANSDERMAL
  Filled 2021-12-01 (×4): qty 1

## 2021-12-01 MED ORDER — HYDROMORPHONE HCL 1 MG/ML IJ SOLN
0.5000 mg | INTRAMUSCULAR | Status: AC | PRN
  Administered 2021-12-01 (×3): 1 mg via INTRAVENOUS
  Filled 2021-12-01 (×3): qty 1

## 2021-12-01 MED ORDER — ACETAMINOPHEN 325 MG PO TABS
650.0000 mg | ORAL_TABLET | Freq: Four times a day (QID) | ORAL | Status: DC | PRN
  Administered 2021-12-01: 650 mg via ORAL
  Filled 2021-12-01: qty 2

## 2021-12-01 MED ORDER — OXYCODONE HCL 5 MG PO TABS
5.0000 mg | ORAL_TABLET | ORAL | Status: DC | PRN
  Administered 2021-12-01 (×3): 5 mg via ORAL
  Filled 2021-12-01 (×3): qty 1

## 2021-12-01 MED ORDER — BISACODYL 5 MG PO TBEC: 5.0000 mg | DELAYED_RELEASE_TABLET | Freq: Every day | ORAL | Status: DC | PRN

## 2021-12-01 MED ORDER — TIZANIDINE HCL 4 MG PO TABS
4.0000 mg | ORAL_TABLET | Freq: Two times a day (BID) | ORAL | Status: DC | PRN
Start: 2021-12-01 — End: 2021-12-01

## 2021-12-01 MED ORDER — PREDNISONE 10 MG PO TABS: 10.0000 mg | ORAL_TABLET | Freq: Every day | ORAL | Status: DC

## 2021-12-01 MED ORDER — SODIUM CHLORIDE 0.9 % IV SOLN: INTRAVENOUS | Status: DC

## 2021-12-01 MED ORDER — METHOCARBAMOL 1000 MG/10ML IJ SOLN
500.0000 mg | Freq: Four times a day (QID) | INTRAVENOUS | Status: DC | PRN
  Filled 2021-12-01: qty 5

## 2021-12-01 MED ORDER — ALBUTEROL SULFATE (2.5 MG/3ML) 0.083% IN NEBU: 2.5000 mg | INHALATION_SOLUTION | Freq: Four times a day (QID) | RESPIRATORY_TRACT | Status: DC | PRN

## 2021-12-01 MED ORDER — SENNOSIDES-DOCUSATE SODIUM 8.6-50 MG PO TABS: 1.0000 | ORAL_TABLET | Freq: Every evening | ORAL | Status: DC | PRN

## 2021-12-01 MED ORDER — PRAMIPEXOLE DIHYDROCHLORIDE 0.25 MG PO TABS: 0.2500 mg | ORAL_TABLET | Freq: Every evening | ORAL | Status: DC | PRN

## 2021-12-01 MED ORDER — PREDNISONE 20 MG PO TABS
20.0000 mg | ORAL_TABLET | Freq: Every day | ORAL | Status: AC
  Administered 2021-12-02: 20 mg via ORAL
  Filled 2021-12-01: qty 1

## 2021-12-01 MED ORDER — GABAPENTIN 300 MG PO CAPS
600.0000 mg | ORAL_CAPSULE | Freq: Four times a day (QID) | ORAL | Status: DC
  Administered 2021-12-01 – 2021-12-02 (×6): 600 mg via ORAL
  Filled 2021-12-01 (×6): qty 2

## 2021-12-01 MED ORDER — ACETAMINOPHEN 650 MG RE SUPP: 650.0000 mg | Freq: Four times a day (QID) | RECTAL | Status: DC | PRN

## 2021-12-01 MED ORDER — DICLOFENAC SODIUM 1 % EX GEL
2.0000 g | Freq: Four times a day (QID) | CUTANEOUS | Status: DC | PRN
Start: 2021-12-01 — End: 2021-12-02

## 2021-12-01 MED ORDER — METHOCARBAMOL 750 MG PO TABS
750.0000 mg | ORAL_TABLET | Freq: Four times a day (QID) | ORAL | Status: DC | PRN
Start: 2021-12-01 — End: 2021-12-02
  Administered 2021-12-01 – 2021-12-02 (×5): 750 mg via ORAL
  Filled 2021-12-01 (×5): qty 1

## 2021-12-01 MED ORDER — PREDNISONE 20 MG PO TABS
30.0000 mg | ORAL_TABLET | Freq: Every day | ORAL | Status: AC
Start: 2021-12-01 — End: 2021-12-01
  Administered 2021-12-01: 30 mg via ORAL
  Filled 2021-12-01: qty 1

## 2021-12-01 MED ORDER — PREDNISONE 10 MG (21) PO TBPK: 10.0000 mg | ORAL_TABLET | ORAL | Status: DC

## 2021-12-01 MED ORDER — METOPROLOL SUCCINATE ER 25 MG PO TB24
25.0000 mg | ORAL_TABLET | Freq: Every day | ORAL | Status: DC
  Administered 2021-12-01 – 2021-12-02 (×2): 25 mg via ORAL
  Filled 2021-12-01 (×2): qty 1

## 2021-12-01 MED ORDER — LIDOCAINE 5 % EX PTCH: 1.0000 | MEDICATED_PATCH | Freq: Two times a day (BID) | CUTANEOUS | Status: DC

## 2021-12-01 MED ORDER — OXYCODONE-ACETAMINOPHEN 5-325 MG PO TABS
1.0000 | ORAL_TABLET | ORAL | Status: DC | PRN
  Administered 2021-12-01 – 2021-12-02 (×4): 1 via ORAL
  Filled 2021-12-01 (×4): qty 1

## 2021-12-01 MED ORDER — OXYCODONE HCL 5 MG PO TABS
5.0000 mg | ORAL_TABLET | ORAL | Status: DC | PRN
  Administered 2021-12-01 – 2021-12-02 (×2): 5 mg via ORAL
  Filled 2021-12-01 (×2): qty 1

## 2021-12-01 NOTE — ED Notes (Signed)
ED TO INPATIENT HANDOFF REPORT  ED Nurse Name and Phone #:    S Name/Age/Gender Brenda Berry 51 y.o. female Room/Bed: 031C/031C  Code Status   Code Status: Prior  Home/SNF/Other Home Patient oriented to: self, place, time, and situation Is this baseline? Yes   Triage Complete: Triage complete  Chief Complaint Intractable back pain [M54.9]  Triage Note Patient here with continued lower back pain, had MRI recently that showed possible Brenda discitis osteomyelitis. Patient came to ED due to continued pain and a fever at home. Afebrile in triage, patient is alert and oriented at this time.   Allergies Allergies  Allergen Reactions   Elmiron [Pentosan Polysulfate] Nausea Only    Nose bleeds   Furosemide Hives, Itching and Rash   Ciprofloxacin    Demerol [Meperidine]    Ibuprofen    Iodinated Contrast Media     Reports had SOB with last infusion of dye   Latex    Lodine [Etodolac] Hives   Nifedipine    Other Hives    Nefedipine   Sulfa Antibiotics    Terbutaline    Tape Rash    USE PAPER TAPE ONLY    Level of Care/Admitting Diagnosis ED Disposition     ED Disposition  Admit   Condition  --   Comment  Hospital Area: [facility] [[id number]]  Level of Care: Med-Surg [16]  May place patient in observation at [facility] or [facility] if equivalent level of care is available:: No  Covid Evaluation: Asymptomatic - no recent exposure (last 10 days) testing not required  Diagnosis: Intractable back pain [720110]  Admitting Physician: Briscoe Deutscher [[id number]]  Attending Physician: Briscoe Deutscher [[id number]]          B Medical/Surgery History Past Medical History:  Diagnosis Date   Arthritis    CHF (congestive heart failure) (HCC)    Chronic neck and back pain    COPD (chronic obstructive pulmonary disease) (HCC)    Degenerative disc disease, cervical    Degenerative disc disease, lumbar    GERD (gastroesophageal reflux disease)     Hiatal hernia 04/26/2015   History of bronchitis 04/26/2015   History of hematuria 04/26/2015   History of nephrolithiasis 04/26/2015   History of recurrent UTI (urinary tract infection) 04/26/2015   Interstitial cystitis    Status post hysterectomy 04/26/2015   Past Surgical History:  Procedure Laterality Date   BACK SURGERY     BLADDER SURGERY     ESOPHAGOGASTRODUODENOSCOPY (EGD) WITH PROPOFOL N/A 01/15/2019   Procedure: ESOPHAGOGASTRODUODENOSCOPY (EGD) WITH PROPOFOL;  Surgeon: Toney Reil, MD;  Location: [facility] ENDOSCOPY;  Service: Gastroenterology;  Laterality: N/A;   LEFT HEART CATH AND CORONARY ANGIOGRAPHY N/A 02/20/2021   Procedure: LEFT HEART CATH AND CORONARY ANGIOGRAPHY and possible PCI and stent;  Surgeon: Alwyn Pea, MD;  Location: [facility] INVASIVE CV LAB;  Service: Cardiovascular;  Laterality: N/A;   VAGINAL HYSTERECTOMY       A IV Location/Drains/Wounds Patient Lines/Drains/Airways Status     Active Line/Drains/Airways     Name Placement date Placement time Site Days   Peripheral IV 11/30/21 20 G Anterior;Proximal;Right Forearm 11/30/21  2154  Forearm  1   Epidural Catheter 08/19/19 08/19/19  1030  -- 835            Intake/Output Last 24 hours No intake or output data in the 24 hours ending 12/01/21 0014  Labs/Imaging Results for orders placed or performed during the hospital encounter  of 11/30/21 (from the past 48 hour(s))  Urinalysis, Routine w reflex microscopic Urine, Clean Catch     Status: Abnormal   Collection Time: 11/30/21  5:18 PM  Result Value Ref Range   Color, Urine STRAW (A) YELLOW   APPearance CLEAR CLEAR   Specific Gravity, Urine 1.005 1.005 - 1.030   pH 7.0 5.0 - 8.0   Glucose, UA NEGATIVE NEGATIVE mg/dL   Hgb urine dipstick SMALL (A) NEGATIVE   Bilirubin Urine NEGATIVE NEGATIVE   Ketones, ur NEGATIVE NEGATIVE mg/dL   Protein, ur NEGATIVE NEGATIVE mg/dL   Nitrite NEGATIVE NEGATIVE   Leukocytes,Ua NEGATIVE NEGATIVE   RBC /  HPF 0-5 0 - 5 RBC/hpf   WBC, UA 0-5 0 - 5 WBC/hpf   Bacteria, UA NONE SEEN NONE SEEN   Squamous Epithelial / LPF 0-5 0 - 5   Mucus PRESENT     Comment: Performed at [facility], [address], [place], [place] [address]  CBC with Differential     Status: Abnormal   Collection Time: 11/30/21  5:41 PM  Result Value Ref Range   WBC 11.7 (H) 4.0 - 10.5 K/uL   RBC 4.74 3.87 - 5.11 MIL/uL   Hemoglobin 14.4 12.0 - 15.0 g/dL   HCT 19.1 47.8 - 29.5 %   MCV 92.4 80.0 - 100.0 fL   MCH 30.4 26.0 - 34.0 pg   MCHC 32.9 30.0 - 36.0 g/dL   RDW 62.1 30.8 - 65.7 %   Platelets 360 150 - 400 K/uL   nRBC 0.0 0.0 - 0.2 %   Neutrophils Relative % 63 %   Neutro Abs 7.3 1.7 - 7.7 K/uL   Lymphocytes Relative 32 %   Lymphs Abs 3.8 0.7 - 4.0 K/uL   Monocytes Relative 5 %   Monocytes Absolute 0.6 0.1 - 1.0 K/uL   Eosinophils Relative 0 %   Eosinophils Absolute 0.0 0.0 - 0.5 K/uL   Basophils Relative 0 %   Basophils Absolute 0.0 0.0 - 0.1 K/uL   Immature Granulocytes 0 %   Abs Immature Granulocytes 0.04 0.00 - 0.07 K/uL    Comment: Performed at [facility], [address], [place], [place] [address]  Comprehensive metabolic panel     Status: Abnormal   Collection Time: 11/30/21  5:41 PM  Result Value Ref Range   Sodium 142 135 - 145 mmol/L   Potassium 3.7 3.5 - 5.1 mmol/L   Chloride 105 98 - 111 mmol/L   CO2 25 22 - 32 mmol/L   Glucose, Bld 104 (H) 70 - 99 mg/dL    Comment: Glucose reference range applies only to samples taken after fasting for at least 8 hours.   BUN 7 6 - 20 mg/dL   Creatinine, Ser 2.95 0.44 - 1.00 mg/dL   Calcium 9.8 8.9 - 28.4 mg/dL   Total Protein 7.3 6.5 - 8.1 g/dL   Albumin 4.1 3.5 - 5.0 g/dL   AST 14 (L) 15 - 41 U/L   ALT 16 0 - 44 U/L   Alkaline Phosphatase 72 38 - 126 U/L   Total Bilirubin 0.6 0.3 - 1.2 mg/dL   GFR, Estimated >13 >24 mL/min    Comment: (NOTE) Calculated using the CKD-EPI Creatinine Equation (2021)    Anion gap 12 5 - 15    Comment:  Performed at [facility], [address], [place], [place] [address]  Sedimentation rate     Status: None   Collection  Time: 11/30/21  5:41 PM  Result Value Ref Range   Sed Rate 10 0 - 22 mm/hr    Comment: Performed at [facility], [address], [place], [place] [address]  Lactic acid, plasma     Status: Abnormal   Collection Time: 11/30/21  5:42 PM  Result Value Ref Range   Lactic Acid, Venous 2.2 (HH) 0.5 - 1.9 mmol/L    Comment: CRITICAL RESULT CALLED TO, READ BACK BY AND VERIFIED WITH: Mammie Russian [id number] 11/30/2021 WBOND Performed at [facility], [address], [place], [place] [address]   Lactic acid, plasma     Status: Abnormal   Collection Time: 11/30/21  9:44 PM  Result Value Ref Range   Lactic Acid, Venous 2.3 (HH) 0.5 - 1.9 mmol/L    Comment: CRITICAL VALUE NOTED.  VALUE IS CONSISTENT WITH PREVIOUSLY REPORTED AND CALLED VALUE. Performed at [facility], [address], [place], [place] [address]   SARS Coronavirus 2 by RT PCR (hospital order, performed in [facility] hospital lab) *cepheid single result test* Anterior Nasal Swab     Status: None   Collection Time: 11/30/21 10:36 PM   Specimen: Anterior Nasal Swab  Result Value Ref Range   SARS Coronavirus 2 by RT PCR NEGATIVE NEGATIVE    Comment: (NOTE) SARS-CoV-2 target nucleic acids are NOT DETECTED.  The SARS-CoV-2 RNA is generally detectable in upper and lower respiratory specimens during the acute phase of infection. The lowest concentration of SARS-CoV-2 viral copies this assay can detect is 250 copies / mL. A negative result does not preclude SARS-CoV-2 infection and should not be used as the sole basis for treatment or other patient management decisions.  A negative result may occur with improper specimen collection / handling, submission of specimen other than nasopharyngeal swab, presence of viral mutation(s) within the areas targeted by this assay, and inadequate number of viral  copies (<250 copies / mL). A negative result must be combined with clinical observations, patient history, and epidemiological information.  Fact Sheet for Patients:   RoadLapTop.co.za  Fact Sheet for Healthcare Providers: http://kim-miller.com/  This test is not yet approved or  cleared by the [place] FDA and has been authorized for detection and/or diagnosis of SARS-CoV-2 by FDA under an Emergency Use Authorization (EUA).  This EUA will remain in effect (meaning this test can be used) for the duration of the COVID-19 declaration under Section 564(b)(1) of the Act, 21 U.S.C. section 360bbb-3(b)(1), unless the authorization is terminated or revoked sooner.  Performed at [facility], [address] [place], [place] [address]    No results found.  Pending Labs Unresulted Labs (From admission, onward)     Start     Ordered   11/30/21 1733  Culture, blood (routine x 2)  BLOOD CULTURE X 2,   R (with STAT occurrences)      11/30/21 1732            Vitals/Pain Today's Vitals   11/30/21 1731 11/30/21 2152 11/30/21 2227 12/01/21 0000  BP:  131/73    Pulse:  72    Resp:  19    Temp:    98.7 F (37.1 C)  TempSrc:    Oral  SpO2:  100%    PainSc: 10-Worst pain ever  8      Isolation Precautions Airborne and Contact precautions  Medications Medications  HYDROmorphone (DILAUDID) injection 1 mg (1 mg Intravenous Given 11/30/21 2154)  piperacillin-tazobactam (  ZOSYN) IVPB 3.375 g (0 g Intravenous Stopped 11/30/21 2256)  sodium chloride 0.9 % bolus 1,000 mL (0 mLs Intravenous Stopped 11/30/21 2332)  HYDROmorphone (DILAUDID) injection 1 mg (1 mg Intravenous Given 11/30/21 2258)    Mobility walks Low fall risk   Focused Assessments Neuro Assessment Handoff:  Swallow screen pass?            Neuro Assessment:   Neuro Checks:      Last Documented NIHSS Modified Score:   Has TPA been given? No If patient is a  Neuro Trauma and patient is going to OR before floor call report to 4N Charge nurse: [phone] or [phone]   R Recommendations: See Admitting Provider Note  Report given to:   Additional Notes:

## 2021-12-01 NOTE — H&P (Addendum)
History and Physical    AMARIAH PANGELINAN ZOX:[id number] DOB: 09-Oct-1970 DOA: 11/30/2021  PCP: Duanne Limerick, MD   Patient coming from: Home   Chief Complaint: Severe back pain, chills   HPI: Brenda Berry is a pleasant 51 y.o. female with medical history significant for chronic back pain on opiates, COPD, current smoker, presenting to the emergency department for evaluation of severe worsening in her chronic back pain.  Patient was getting out of her car approximately 10 days ago when she felt a twinge in midline low back and has since been experiencing severe pain.  She has chronic pain in this area with radiation down both legs, and also reports some chronic numbness and weakness, but now has severe shooting pain down the right leg that she is unable to control with her home medications.  She was seen for this in the [facility] emergency department on 11/24/2021 where she had MRI performed.  Radiology included possibility of early discitis osteomyelitis on the differential, and the ED physician at [facility] discussed this with the on-call neurosurgeon Dr. Marcell Barlow.  Neurosurgery did not believe that the patient had discitis osteomyelitis and she was discharged home with prednisone taper.  She continues to experience severe pain and reports some chills.  She notes that she has had chills before in the setting of severe pain and has not taken her temperature.    ED Course: Upon arrival to the ED, patient is found to be afebrile and saturating well on room air with heart rate as high as 120 and stable blood pressure.  CBC notable for WBC 11,700.  Lactic acid was elevated to 2.2.  Blood cultures were collected, a liter of saline was administered, the patient was treated with multiple doses of IV Dilaudid.  ED physician discussed the case with Dr. Maisie Fus of neurosurgery here who reviewed the images and agreed that there was no indication of infection on the MRI, and no indication for surgery at this time.  Review  of Systems:  All other systems reviewed and apart from HPI, are negative.  Past Medical History:  Diagnosis Date   Arthritis    CHF (congestive heart failure) (HCC)    Chronic neck and back pain    COPD (chronic obstructive pulmonary disease) (HCC)    Degenerative disc disease, cervical    Degenerative disc disease, lumbar    GERD (gastroesophageal reflux disease)    Hiatal hernia 04/26/2015   History of bronchitis 04/26/2015   History of hematuria 04/26/2015   History of nephrolithiasis 04/26/2015   History of recurrent UTI (urinary tract infection) 04/26/2015   Interstitial cystitis    Status post hysterectomy 04/26/2015    Past Surgical History:  Procedure Laterality Date   BACK SURGERY     BLADDER SURGERY     ESOPHAGOGASTRODUODENOSCOPY (EGD) WITH PROPOFOL N/A 01/15/2019   Procedure: ESOPHAGOGASTRODUODENOSCOPY (EGD) WITH PROPOFOL;  Surgeon: Toney Reil, MD;  Location: [facility] ENDOSCOPY;  Service: Gastroenterology;  Laterality: N/A;   LEFT HEART CATH AND CORONARY ANGIOGRAPHY N/A 02/20/2021   Procedure: LEFT HEART CATH AND CORONARY ANGIOGRAPHY and possible PCI and stent;  Surgeon: Alwyn Pea, MD;  Location: [facility] INVASIVE CV LAB;  Service: Cardiovascular;  Laterality: N/A;   VAGINAL HYSTERECTOMY      Social History:   reports that she has been smoking cigarettes. She has a 13.00 pack-year smoking history. She has never used smokeless tobacco. She reports current alcohol use. She reports that she does not use drugs.  Allergies  Allergen Reactions   Elmiron [Pentosan Polysulfate] Nausea Only    Nose bleeds   Furosemide Hives, Itching and Rash   Ciprofloxacin    Demerol [Meperidine]    Ibuprofen    Iodinated Contrast Media     Reports had SOB with last infusion of dye   Latex    Lodine [Etodolac] Hives   Nifedipine    Other Hives    Nefedipine   Sulfa Antibiotics    Terbutaline    Tape Rash    USE PAPER TAPE ONLY    Family History  Problem Relation Age  of Onset   Diabetes Mother    Hyperlipidemia Mother    Hypertension Mother    Heart disease Father      Prior to Admission medications   Medication Sig Start Date End Date Taking? Authorizing Provider  acetaminophen (TYLENOL) 325 MG tablet Take 650 mg by mouth every 4 (four) hours as needed for moderate pain or headache. 01/31/15  Yes [provider]  albuterol (VENTOLIN HFA) 108 (90 Base) MCG/ACT inhaler TAKE 2 PUFFS BY MOUTH EVERY 6 HOURS AS NEEDED FOR WHEEZE OR SHORTNESS OF BREATH Patient taking differently: Inhale 2 puffs into the lungs every 6 (six) hours as needed for wheezing or shortness of breath. 10/02/21  Yes Duanne Limerick, MD  bumetanide (BUMEX) 0.5 MG tablet Take 0.5 mg by mouth daily. 02/10/21 02/10/22 Yes [provider]  cholecalciferol (VITAMIN D3) 25 MCG (1000 UNIT) tablet Take 1,000 Units by mouth daily.   Yes [provider]  diclofenac Sodium (VOLTAREN) 1 % GEL Apply 2 g topically 4 (four) times daily as needed (back, knee, neck pain). 01/12/21  Yes [provider]  gabapentin (NEURONTIN) 300 MG capsule Take 2 capsules (600 mg total) by mouth 4 (four) times daily. Patient taking differently: Take 300-600 mg by mouth 3 (three) times daily. 01/06/20  Yes Edward Jolly, MD  lidocaine (LIDODERM) 5 % Place 1 patch onto the skin every 12 (twelve) hours for 5 days. Remove & Discard patch within 12 hours or as directed by MD remove for 12 hours before placing new patch 11/27/21 12/02/21 Yes Gilles Chiquito, MD  loratadine (CLARITIN) 10 MG tablet Take 10 mg by mouth daily as needed for allergies.   Yes [provider]  magnesium 30 MG tablet Take 30 mg by mouth 2 (two) times daily.   Yes [provider]  metoprolol succinate (TOPROL-XL) 25 MG 24 hr tablet Take 25 mg by mouth daily. 01/25/21  Yes [provider]  nitroGLYCERIN (NITROSTAT) 0.4 MG SL tablet Place 1 tablet (0.4 mg total) under the tongue every 5 (five) minutes as  needed for chest pain. 02/22/21  Yes Duanne Limerick, MD  oxyCODONE-acetaminophen (PERCOCET/ROXICET) 5-325 MG tablet Take 1 tablet by mouth 4 (four) times daily as needed for moderate pain. 01/14/21  Yes [provider]  pramipexole (MIRAPEX) 0.25 MG tablet TAKE 1 TABLET BY MOUTH AT BEDTIME. Patient taking differently: Take 0.25 mg by mouth at bedtime as needed (restless legs). 11/07/21  Yes Duanne Limerick, MD  predniSONE (STERAPRED UNI-PAK 21 TAB) 10 MG (21) TBPK tablet Take as packaging recommends Patient taking differently: Take 10 mg by mouth See admin instructions. 6,5,4,3,2,1 11/27/21  Yes Gilles Chiquito, MD  tiZANidine (ZANAFLEX) 4 MG tablet Take 4 mg by mouth 2 (two) times daily as needed for muscle spasms. 01/12/21  Yes [provider]  vitamin C (ASCORBIC ACID) 500 MG tablet Take 500 mg by  mouth daily.   Yes [provider]  estradiol (ESTRACE) 1 MG tablet TAKE 1 TABLET(1 MG) BY MOUTH DAILY Patient not taking: Reported on 05/17/2021 12/17/20   Nadara Mustard, MD    Physical Exam: Vitals:   11/30/21 1728 11/30/21 2152 12/01/21 0000 12/01/21 0005  BP: (!) 141/101 131/73    Pulse: (!) 123 72  78  Resp: (!) 22 19  15   Temp: 98.4 F (36.9 C)  98.7 F (37.1 C)   TempSrc: Oral  Oral   SpO2: 100% 100%  99%    Constitutional: NAD, calm  Eyes: PERTLA, lids and conjunctivae normal ENMT: Mucous membranes are moist. Posterior pharynx clear of any exudate or lesions.   Neck: supple, no masses  Respiratory: no wheezing, no crackles. No accessory muscle use.  Cardiovascular: S1 & S2 heard, regular rate and rhythm. No extremity edema.   Abdomen: No distension, no tenderness, soft. Bowel sounds active.  Musculoskeletal: no clubbing / cyanosis. No joint deformity upper and lower extremities.   Skin: no significant rashes, lesions, ulcers. Warm, dry, well-perfused. Neurologic: CN 2-12 grossly intact. Moving all extremities, LE strength testing severely limited by pain.  Alert and oriented.  Psychiatric: Pleasant. Cooperative.    Labs and Imaging on Admission: I have personally reviewed following labs and imaging studies  CBC: Recent Labs  Lab 11/27/21 1400 11/30/21 1741  WBC 9.0 11.7*  NEUTROABS 5.8 7.3  HGB 13.7 14.4  HCT 41.1 43.8  MCV 91.7 92.4  PLT 310 360   Basic Metabolic Panel: Recent Labs  Lab 11/27/21 1400 11/30/21 1741  NA 140 142  K 4.3 3.7  CL 107 105  CO2 25 25  GLUCOSE 121* 104*  BUN 8 7  CREATININE 0.71 0.94  CALCIUM 9.5 9.8   GFR: Estimated Creatinine Clearance: 70.6 mL/min (by C-G formula based on SCr of 0.94 mg/dL). Liver Function Tests: Recent Labs  Lab 11/27/21 1400 11/30/21 1741  AST 20 14*  ALT 15 16  ALKPHOS 74 72  BILITOT 0.5 0.6  PROT 7.5 7.3  ALBUMIN 3.9 4.1   No results for input(s): "LIPASE", "AMYLASE" in the last 168 hours. No results for input(s): "AMMONIA" in the last 168 hours. Coagulation Profile: No results for input(s): "INR", "PROTIME" in the last 168 hours. Cardiac Enzymes: No results for input(s): "CKTOTAL", "CKMB", "CKMBINDEX", "TROPONINI" in the last 168 hours. BNP (last 3 results) No results for input(s): "PROBNP" in the last 8760 hours. HbA1C: No results for input(s): "HGBA1C" in the last 72 hours. CBG: No results for input(s): "GLUCAP" in the last 168 hours. Lipid Profile: No results for input(s): "CHOL", "HDL", "LDLCALC", "TRIG", "CHOLHDL", "LDLDIRECT" in the last 72 hours. Thyroid Function Tests: No results for input(s): "TSH", "T4TOTAL", "FREET4", "T3FREE", "THYROIDAB" in the last 72 hours. Anemia Panel: No results for input(s): "VITAMINB12", "FOLATE", "FERRITIN", "TIBC", "IRON", "RETICCTPCT" in the last 72 hours. Urine analysis:    Component Value Date/Time   COLORURINE STRAW (A) 11/30/2021 1718   APPEARANCEUR CLEAR 11/30/2021 1718   APPEARANCEUR Clear 10/28/2011 1227   LABSPEC 1.005 11/30/2021 1718   LABSPEC 1.004 10/28/2011 1227   PHURINE 7.0 11/30/2021 1718    GLUCOSEU NEGATIVE 11/30/2021 1718   GLUCOSEU Negative 10/28/2011 1227   HGBUR SMALL (A) 11/30/2021 1718   BILIRUBINUR NEGATIVE 11/30/2021 1718   BILIRUBINUR Negative 10/28/2011 1227   KETONESUR NEGATIVE 11/30/2021 1718   PROTEINUR NEGATIVE 11/30/2021 1718   NITRITE NEGATIVE 11/30/2021 1718   LEUKOCYTESUR NEGATIVE 11/30/2021 1718   LEUKOCYTESUR 1+ 10/28/2011  1227   Sepsis Labs: @LABRCNTIP (procalcitonin:4,lacticidven:4) ) Recent Results (from the past 240 hour(s))  SARS Coronavirus 2 by RT PCR (hospital order, performed in [facility] hospital lab) *cepheid single result test* Anterior Nasal Swab     Status: None   Collection Time: 11/30/21 10:36 PM   Specimen: Anterior Nasal Swab  Result Value Ref Range Status   SARS Coronavirus 2 by RT PCR NEGATIVE NEGATIVE Final    Comment: (NOTE) SARS-CoV-2 target nucleic acids are NOT DETECTED.  The SARS-CoV-2 RNA is generally detectable in upper and lower respiratory specimens during the acute phase of infection. The lowest concentration of SARS-CoV-2 viral copies this assay can detect is 250 copies / mL. A negative result does not preclude SARS-CoV-2 infection and should not be used as the sole basis for treatment or other patient management decisions.  A negative result may occur with improper specimen collection / handling, submission of specimen other than nasopharyngeal swab, presence of viral mutation(s) within the areas targeted by this assay, and inadequate number of viral copies (<250 copies / mL). A negative result must be combined with clinical observations, patient history, and epidemiological information.  Fact Sheet for Patients:   RoadLapTop.co.za  Fact Sheet for Healthcare Providers: http://kim-miller.com/  This test is not yet approved or  cleared by the [place] FDA and has been authorized for detection and/or diagnosis of SARS-CoV-2 by FDA under an Emergency Use  Authorization (EUA).  This EUA will remain in effect (meaning this test can be used) for the duration of the COVID-19 declaration under Section 564(b)(1) of the Act, 21 U.S.C. section 360bbb-3(b)(1), unless the authorization is terminated or revoked sooner.  Performed at [facility], [address], [place], [place] [address]      Radiological Exams on Admission: No results found.   Assessment/Plan   1. Intractable back pain; chronic back pain  - Pt with chronic back pain presents with ~10 days of increased pain  - She had MRI at [facility] on 6/16 and radiology included early discitis osteomyelitis on their differential; neurosurgeon for [facility] did not feel this was infectious, and neurosurgery on call here tonight agrees  - She has been afebrile in ED with slight leukocytosis in setting of prednisone use  - Pt continues to complain of severe pain despite multiple doses IV Dilaudid  - Continue prednisone taper, gabapentin, muscle relaxant, lidocaine patch, Voltaren gel, oxycodone, and continue as-needed Dilaudid for now for acute pain    2. COPD  - Stable, not in exacerbation    DVT prophylaxis: SCDs  Code Status: Full  Level of Care: Level of care: Med-Surg Family Communication: Updated at bedside  Disposition Plan:  Patient is from: Home   Anticipated d/c is to: TBD Anticipated d/c date is: 6/23 or 12/02/21  Patient currently: Pending pain-control, transition to oral medications  Consults called: None  Admission status: Observation     Briscoe Deutscher, MD [facility]  12/01/2021, 1:08 AM

## 2021-12-02 DIAGNOSIS — M549 Dorsalgia, unspecified: Secondary | ICD-10-CM | POA: Diagnosis not present

## 2021-12-02 DIAGNOSIS — R531 Weakness: Secondary | ICD-10-CM | POA: Diagnosis not present

## 2021-12-02 MED ORDER — OXYCODONE-ACETAMINOPHEN 5-325 MG PO TABS: 2.0000 | ORAL_TABLET | ORAL | Status: DC | PRN

## 2021-12-02 MED ORDER — METHOCARBAMOL 750 MG PO TABS: 750.0000 mg | ORAL_TABLET | Freq: Four times a day (QID) | ORAL | 1 refills | Status: AC | PRN

## 2021-12-02 MED ORDER — NICOTINE 14 MG/24HR TD PT24
14.0000 mg | MEDICATED_PATCH | Freq: Every day | TRANSDERMAL | Status: DC
  Administered 2021-12-02: 14 mg via TRANSDERMAL
  Filled 2021-12-02: qty 1

## 2021-12-02 MED ORDER — OXYCODONE HCL 5 MG PO TABS: 5.0000 mg | ORAL_TABLET | Freq: Three times a day (TID) | ORAL | 0 refills | Status: AC | PRN

## 2021-12-02 MED ORDER — PREDNISONE 10 MG PO TABS: 10.0000 mg | ORAL_TABLET | Freq: Every day | ORAL | 0 refills | Status: AC

## 2021-12-02 MED ORDER — 3-IN-1 BEDSIDE TOILET MISC: 1.0000 | 0 refills | Status: AC | PRN

## 2021-12-02 MED ORDER — TIZANIDINE HCL 4 MG PO TABS
4.0000 mg | ORAL_TABLET | Freq: Two times a day (BID) | ORAL | 0 refills | Status: AC | PRN
Start: 2021-12-02 — End: ?

## 2021-12-02 MED ORDER — OXYCODONE-ACETAMINOPHEN 5-325 MG PO TABS: 1.0000 | ORAL_TABLET | Freq: Four times a day (QID) | ORAL | 0 refills | Status: AC | PRN

## 2021-12-02 MED ORDER — NICOTINE 14 MG/24HR TD PT24: 14.0000 mg | MEDICATED_PATCH | Freq: Every day | TRANSDERMAL | 0 refills | Status: AC

## 2021-12-02 NOTE — TOC Transition Note (Signed)
Transition of Care [facility]) - CM/SW Discharge Note   Patient Details  Name: Brenda Berry MRN: [id number] Date of Birth: 1970/12/14  [facility] CM/SW Contact:  Bess Kinds, RN Phone Number: [phone] 12/02/2021, 12:45 PM   Clinical Narrative:     Patient to transition home today. Outpatient rehab referral to [facility]. Spoke with patient to discuss transition plans. DME referral for RW and 3N1 to [employer] for delivery to the room. Patient stated that she has a ride home. No further TOC needs identified at this time.   Final next level of care: OP Rehab Barriers to Discharge: No Barriers Identified   Patient Goals and CMS Choice Patient states their goals for this hospitalization and ongoing recovery are:: return home CMS Medicare.gov Compare Post Acute Care list provided to:: Patient Choice offered to / list presented to : Patient  Discharge Placement                       Discharge Plan and Services                DME Arranged: 3-N-1, Walker rolling DME Agency: [employer] Date DME Agency Contacted: 12/02/21 Time DME Agency Contacted: 1245 Representative spoke with at DME Agency: [facility] HH Arranged: NA HH Agency: NA        Social Determinants of Health (SDOH) Interventions     Readmission Risk Interventions     No data to display

## 2021-12-05 DIAGNOSIS — M542 Cervicalgia: Secondary | ICD-10-CM | POA: Diagnosis not present

## 2021-12-05 DIAGNOSIS — Z79899 Other long term (current) drug therapy: Secondary | ICD-10-CM | POA: Diagnosis not present

## 2021-12-05 DIAGNOSIS — M5430 Sciatica, unspecified side: Secondary | ICD-10-CM | POA: Diagnosis not present

## 2021-12-05 DIAGNOSIS — M545 Low back pain, unspecified: Secondary | ICD-10-CM | POA: Diagnosis not present

## 2021-12-05 DIAGNOSIS — G8929 Other chronic pain: Secondary | ICD-10-CM | POA: Diagnosis not present

## 2021-12-05 LAB — CULTURE, BLOOD (ROUTINE X 2)
Culture: NO GROWTH
Culture: NO GROWTH

## 2021-12-21 DIAGNOSIS — M47816 Spondylosis without myelopathy or radiculopathy, lumbar region: Secondary | ICD-10-CM | POA: Diagnosis not present

## 2021-12-30 ENCOUNTER — Other Ambulatory Visit: Payer: Self-pay | Admitting: Family Medicine

## 2021-12-30 DIAGNOSIS — G2581 Restless legs syndrome: Secondary | ICD-10-CM

## 2022-01-03 DIAGNOSIS — M542 Cervicalgia: Secondary | ICD-10-CM | POA: Diagnosis not present

## 2022-01-03 DIAGNOSIS — Z79899 Other long term (current) drug therapy: Secondary | ICD-10-CM | POA: Diagnosis not present

## 2022-01-03 DIAGNOSIS — Z9889 Other specified postprocedural states: Secondary | ICD-10-CM | POA: Diagnosis not present

## 2022-01-03 DIAGNOSIS — M7061 Trochanteric bursitis, right hip: Secondary | ICD-10-CM | POA: Diagnosis not present

## 2022-01-03 DIAGNOSIS — G894 Chronic pain syndrome: Secondary | ICD-10-CM | POA: Diagnosis not present

## 2022-01-03 DIAGNOSIS — M791 Myalgia, unspecified site: Secondary | ICD-10-CM | POA: Diagnosis not present

## 2022-01-03 DIAGNOSIS — G8929 Other chronic pain: Secondary | ICD-10-CM | POA: Diagnosis not present

## 2022-01-03 DIAGNOSIS — M5416 Radiculopathy, lumbar region: Secondary | ICD-10-CM | POA: Diagnosis not present

## 2022-01-03 DIAGNOSIS — F32A Depression, unspecified: Secondary | ICD-10-CM | POA: Diagnosis not present

## 2022-01-03 DIAGNOSIS — M47816 Spondylosis without myelopathy or radiculopathy, lumbar region: Secondary | ICD-10-CM | POA: Diagnosis not present

## 2022-01-03 DIAGNOSIS — M545 Low back pain, unspecified: Secondary | ICD-10-CM | POA: Diagnosis not present

## 2022-01-03 DIAGNOSIS — G5603 Carpal tunnel syndrome, bilateral upper limbs: Secondary | ICD-10-CM | POA: Diagnosis not present

## 2022-01-15 ENCOUNTER — Other Ambulatory Visit: Payer: Self-pay | Admitting: Family Medicine

## 2022-01-15 DIAGNOSIS — G2581 Restless legs syndrome: Secondary | ICD-10-CM

## 2022-01-29 DIAGNOSIS — F32A Depression, unspecified: Secondary | ICD-10-CM | POA: Diagnosis not present

## 2022-01-29 DIAGNOSIS — G8929 Other chronic pain: Secondary | ICD-10-CM | POA: Diagnosis not present

## 2022-01-29 DIAGNOSIS — G5603 Carpal tunnel syndrome, bilateral upper limbs: Secondary | ICD-10-CM | POA: Diagnosis not present

## 2022-01-29 DIAGNOSIS — M545 Low back pain, unspecified: Secondary | ICD-10-CM | POA: Diagnosis not present

## 2022-01-29 DIAGNOSIS — M542 Cervicalgia: Secondary | ICD-10-CM | POA: Diagnosis not present

## 2022-01-29 DIAGNOSIS — Z79899 Other long term (current) drug therapy: Secondary | ICD-10-CM | POA: Diagnosis not present

## 2022-02-16 DIAGNOSIS — J449 Chronic obstructive pulmonary disease, unspecified: Secondary | ICD-10-CM | POA: Diagnosis not present

## 2022-02-16 DIAGNOSIS — R519 Headache, unspecified: Secondary | ICD-10-CM | POA: Diagnosis not present

## 2022-02-16 DIAGNOSIS — R058 Other specified cough: Secondary | ICD-10-CM | POA: Diagnosis not present

## 2022-02-16 DIAGNOSIS — R42 Dizziness and giddiness: Secondary | ICD-10-CM | POA: Diagnosis not present

## 2022-02-16 DIAGNOSIS — G894 Chronic pain syndrome: Secondary | ICD-10-CM | POA: Diagnosis not present

## 2022-02-16 DIAGNOSIS — I509 Heart failure, unspecified: Secondary | ICD-10-CM | POA: Diagnosis not present

## 2022-02-16 DIAGNOSIS — R55 Syncope and collapse: Secondary | ICD-10-CM | POA: Diagnosis not present

## 2022-02-16 DIAGNOSIS — Z882 Allergy status to sulfonamides status: Secondary | ICD-10-CM | POA: Diagnosis not present

## 2022-02-16 DIAGNOSIS — U071 COVID-19: Secondary | ICD-10-CM | POA: Diagnosis not present

## 2022-02-16 DIAGNOSIS — K219 Gastro-esophageal reflux disease without esophagitis: Secondary | ICD-10-CM | POA: Diagnosis not present

## 2022-02-16 DIAGNOSIS — Z79899 Other long term (current) drug therapy: Secondary | ICD-10-CM | POA: Diagnosis not present

## 2022-02-16 DIAGNOSIS — Z885 Allergy status to narcotic agent status: Secondary | ICD-10-CM | POA: Diagnosis not present

## 2022-02-16 DIAGNOSIS — R509 Fever, unspecified: Secondary | ICD-10-CM | POA: Diagnosis not present

## 2022-02-17 DIAGNOSIS — U071 COVID-19: Secondary | ICD-10-CM | POA: Diagnosis not present

## 2022-02-21 DIAGNOSIS — M25561 Pain in right knee: Secondary | ICD-10-CM | POA: Diagnosis not present

## 2022-02-21 DIAGNOSIS — M545 Low back pain, unspecified: Secondary | ICD-10-CM | POA: Diagnosis not present

## 2022-02-21 DIAGNOSIS — M961 Postlaminectomy syndrome, not elsewhere classified: Secondary | ICD-10-CM | POA: Diagnosis not present

## 2022-02-21 DIAGNOSIS — M546 Pain in thoracic spine: Secondary | ICD-10-CM | POA: Diagnosis not present

## 2022-02-21 DIAGNOSIS — M25562 Pain in left knee: Secondary | ICD-10-CM | POA: Diagnosis not present

## 2022-02-21 DIAGNOSIS — G894 Chronic pain syndrome: Secondary | ICD-10-CM | POA: Diagnosis not present

## 2022-02-21 DIAGNOSIS — Z79891 Long term (current) use of opiate analgesic: Secondary | ICD-10-CM | POA: Diagnosis not present

## 2022-03-07 IMAGING — DX DG CHEST 1V PORT
1 series · 1 of 1 positions shown · non-contrast
Comparison: 01/20/2021

CLINICAL DATA: Chest and epigastric pain for 1 day, neck and jaw
pain

EXAM:
PORTABLE CHEST 1 VIEW

[chest ap]
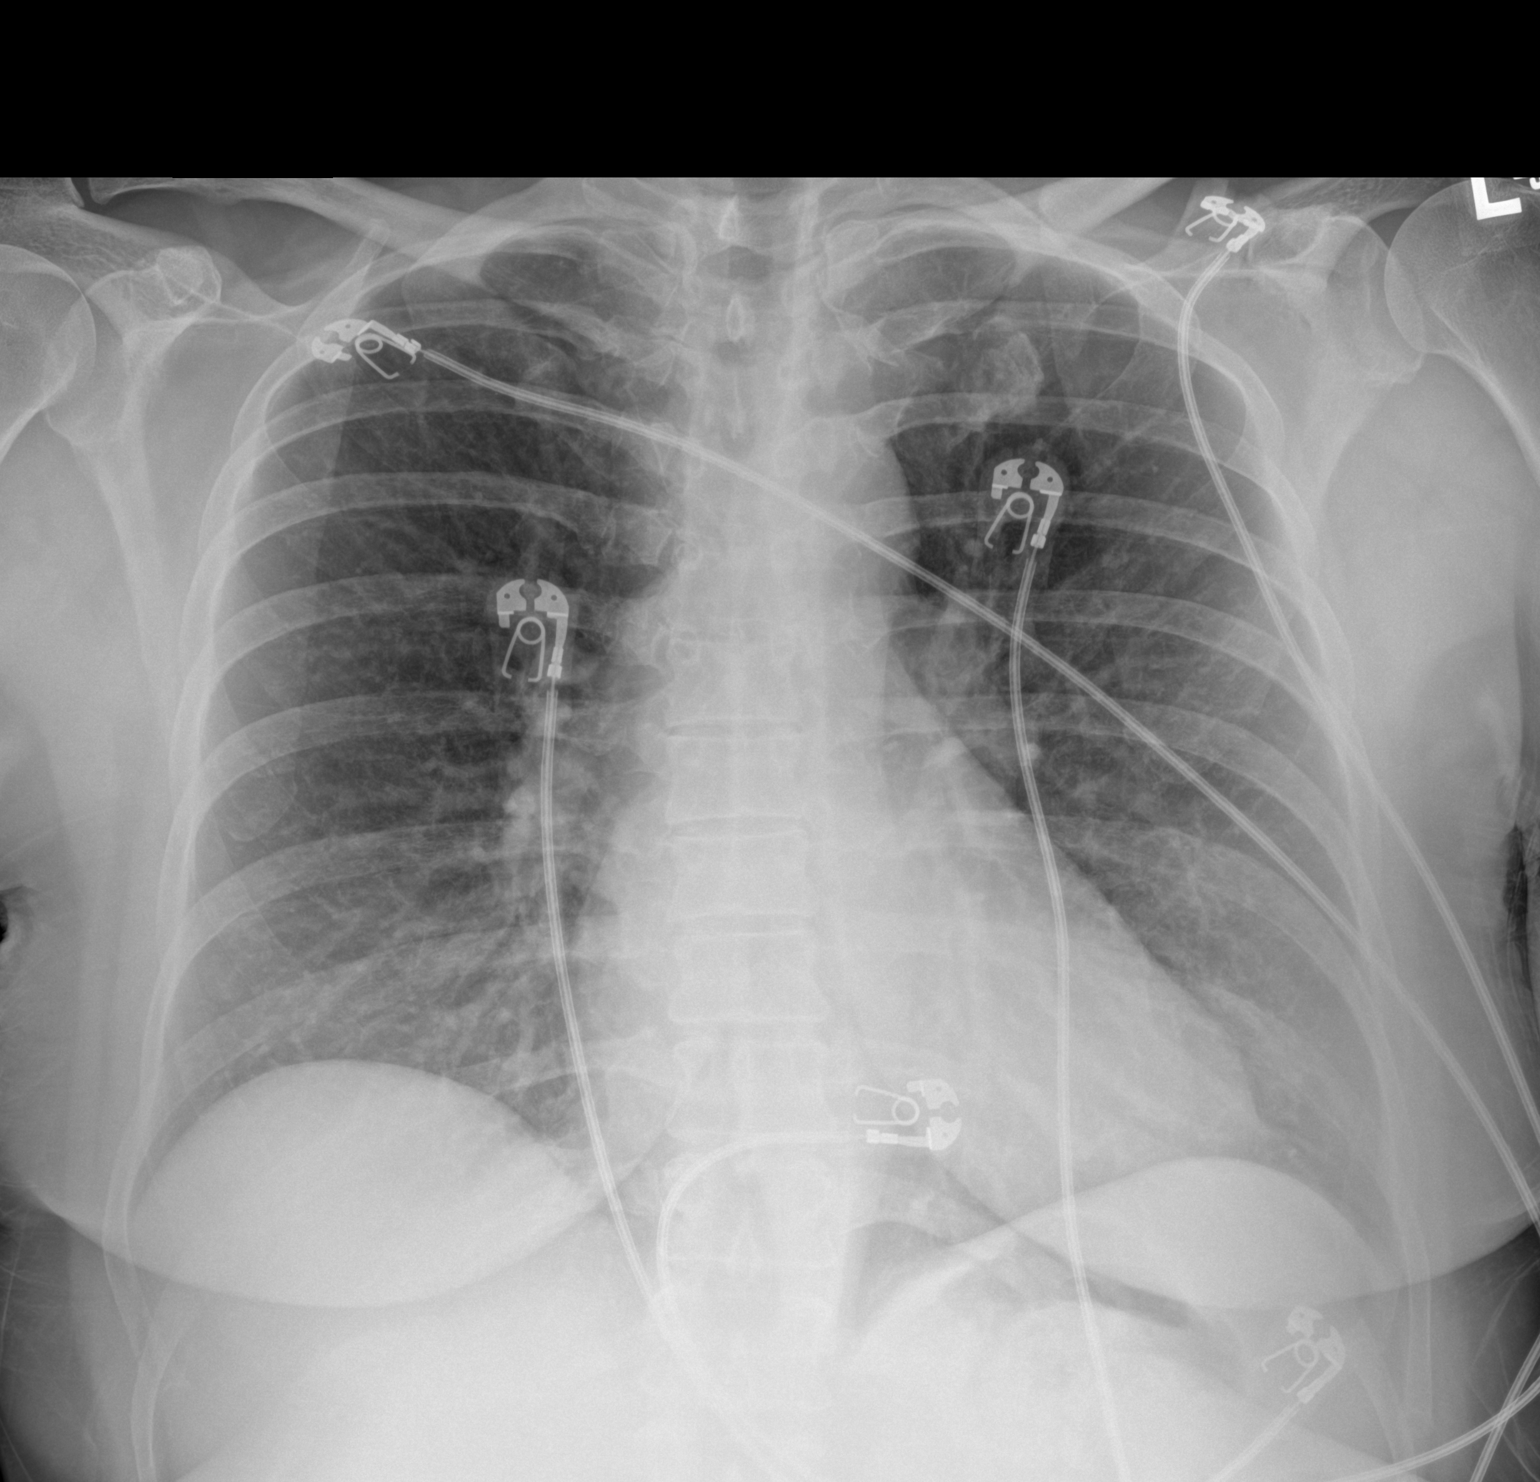

[1 of 1 positions shown; findings below may reference images not displayed]

FINDINGS: The heart size and mediastinal contours are within normal limits.
Both lungs are clear. The visualized skeletal structures are
unremarkable.
IMPRESSION: No active disease.

## 2022-03-28 DIAGNOSIS — M961 Postlaminectomy syndrome, not elsewhere classified: Secondary | ICD-10-CM | POA: Diagnosis not present

## 2022-03-28 DIAGNOSIS — M546 Pain in thoracic spine: Secondary | ICD-10-CM | POA: Diagnosis not present

## 2022-03-28 DIAGNOSIS — M25562 Pain in left knee: Secondary | ICD-10-CM | POA: Diagnosis not present

## 2022-03-28 DIAGNOSIS — M25561 Pain in right knee: Secondary | ICD-10-CM | POA: Diagnosis not present

## 2022-03-28 DIAGNOSIS — Z79891 Long term (current) use of opiate analgesic: Secondary | ICD-10-CM | POA: Diagnosis not present

## 2022-03-28 DIAGNOSIS — G894 Chronic pain syndrome: Secondary | ICD-10-CM | POA: Diagnosis not present

## 2022-03-28 DIAGNOSIS — M545 Low back pain, unspecified: Secondary | ICD-10-CM | POA: Diagnosis not present

## 2022-04-25 DIAGNOSIS — M545 Low back pain, unspecified: Secondary | ICD-10-CM | POA: Diagnosis not present

## 2022-04-25 DIAGNOSIS — M546 Pain in thoracic spine: Secondary | ICD-10-CM | POA: Diagnosis not present

## 2022-04-25 DIAGNOSIS — M25561 Pain in right knee: Secondary | ICD-10-CM | POA: Diagnosis not present

## 2022-04-25 DIAGNOSIS — M961 Postlaminectomy syndrome, not elsewhere classified: Secondary | ICD-10-CM | POA: Diagnosis not present

## 2022-04-25 DIAGNOSIS — M25562 Pain in left knee: Secondary | ICD-10-CM | POA: Diagnosis not present

## 2022-04-25 DIAGNOSIS — G894 Chronic pain syndrome: Secondary | ICD-10-CM | POA: Diagnosis not present

## 2022-04-25 DIAGNOSIS — Z79891 Long term (current) use of opiate analgesic: Secondary | ICD-10-CM | POA: Diagnosis not present

## 2022-05-21 ENCOUNTER — Ambulatory Visit: Payer: Medicare Other

## 2022-05-23 DIAGNOSIS — M961 Postlaminectomy syndrome, not elsewhere classified: Secondary | ICD-10-CM | POA: Diagnosis not present

## 2022-05-23 DIAGNOSIS — M25561 Pain in right knee: Secondary | ICD-10-CM | POA: Diagnosis not present

## 2022-05-23 DIAGNOSIS — Z79891 Long term (current) use of opiate analgesic: Secondary | ICD-10-CM | POA: Diagnosis not present

## 2022-05-23 DIAGNOSIS — M545 Low back pain, unspecified: Secondary | ICD-10-CM | POA: Diagnosis not present

## 2022-05-23 DIAGNOSIS — M25562 Pain in left knee: Secondary | ICD-10-CM | POA: Diagnosis not present

## 2022-05-23 DIAGNOSIS — G894 Chronic pain syndrome: Secondary | ICD-10-CM | POA: Diagnosis not present

## 2022-05-23 DIAGNOSIS — M546 Pain in thoracic spine: Secondary | ICD-10-CM | POA: Diagnosis not present

## 2022-05-25 ENCOUNTER — Ambulatory Visit: Payer: Medicare Other

## 2022-05-25 NOTE — Patient Instructions (Signed)

## 2022-05-25 NOTE — Progress Notes (Unsigned)
Attempted to contact the patient for her Medicare Wellness Visit, no answer.

## 2022-06-20 DIAGNOSIS — G894 Chronic pain syndrome: Secondary | ICD-10-CM | POA: Diagnosis not present

## 2022-06-20 DIAGNOSIS — M546 Pain in thoracic spine: Secondary | ICD-10-CM | POA: Diagnosis not present

## 2022-06-20 DIAGNOSIS — Z79891 Long term (current) use of opiate analgesic: Secondary | ICD-10-CM | POA: Diagnosis not present

## 2022-06-20 DIAGNOSIS — M545 Low back pain, unspecified: Secondary | ICD-10-CM | POA: Diagnosis not present

## 2022-06-20 DIAGNOSIS — M961 Postlaminectomy syndrome, not elsewhere classified: Secondary | ICD-10-CM | POA: Diagnosis not present

## 2022-06-20 DIAGNOSIS — M25562 Pain in left knee: Secondary | ICD-10-CM | POA: Diagnosis not present

## 2022-06-20 DIAGNOSIS — M25561 Pain in right knee: Secondary | ICD-10-CM | POA: Diagnosis not present

## 2022-07-16 ENCOUNTER — Ambulatory Visit: Payer: Medicare Other | Admitting: Family Medicine

## 2022-07-17 ENCOUNTER — Encounter: Payer: Self-pay | Admitting: Family Medicine

## 2022-07-18 DIAGNOSIS — M25562 Pain in left knee: Secondary | ICD-10-CM | POA: Diagnosis not present

## 2022-07-18 DIAGNOSIS — M546 Pain in thoracic spine: Secondary | ICD-10-CM | POA: Diagnosis not present

## 2022-07-18 DIAGNOSIS — M961 Postlaminectomy syndrome, not elsewhere classified: Secondary | ICD-10-CM | POA: Diagnosis not present

## 2022-07-18 DIAGNOSIS — Z79891 Long term (current) use of opiate analgesic: Secondary | ICD-10-CM | POA: Diagnosis not present

## 2022-07-18 DIAGNOSIS — G894 Chronic pain syndrome: Secondary | ICD-10-CM | POA: Diagnosis not present

## 2022-07-18 DIAGNOSIS — M545 Low back pain, unspecified: Secondary | ICD-10-CM | POA: Diagnosis not present

## 2022-07-18 DIAGNOSIS — M25561 Pain in right knee: Secondary | ICD-10-CM | POA: Diagnosis not present

## 2022-08-15 DIAGNOSIS — M25562 Pain in left knee: Secondary | ICD-10-CM | POA: Diagnosis not present

## 2022-08-15 DIAGNOSIS — M961 Postlaminectomy syndrome, not elsewhere classified: Secondary | ICD-10-CM | POA: Diagnosis not present

## 2022-08-15 DIAGNOSIS — Z79891 Long term (current) use of opiate analgesic: Secondary | ICD-10-CM | POA: Diagnosis not present

## 2022-08-15 DIAGNOSIS — M545 Low back pain, unspecified: Secondary | ICD-10-CM | POA: Diagnosis not present

## 2022-08-15 DIAGNOSIS — M25561 Pain in right knee: Secondary | ICD-10-CM | POA: Diagnosis not present

## 2022-08-15 DIAGNOSIS — G894 Chronic pain syndrome: Secondary | ICD-10-CM | POA: Diagnosis not present

## 2022-08-15 DIAGNOSIS — M546 Pain in thoracic spine: Secondary | ICD-10-CM | POA: Diagnosis not present

## 2022-09-05 DIAGNOSIS — F17200 Nicotine dependence, unspecified, uncomplicated: Secondary | ICD-10-CM | POA: Diagnosis not present

## 2022-09-05 DIAGNOSIS — M545 Low back pain, unspecified: Secondary | ICD-10-CM | POA: Diagnosis not present

## 2022-09-05 DIAGNOSIS — M25561 Pain in right knee: Secondary | ICD-10-CM | POA: Diagnosis not present

## 2022-09-05 DIAGNOSIS — M5116 Intervertebral disc disorders with radiculopathy, lumbar region: Secondary | ICD-10-CM | POA: Diagnosis not present

## 2022-09-05 DIAGNOSIS — S3993XA Unspecified injury of pelvis, initial encounter: Secondary | ICD-10-CM | POA: Diagnosis not present

## 2022-09-05 DIAGNOSIS — M5441 Lumbago with sciatica, right side: Secondary | ICD-10-CM | POA: Diagnosis not present

## 2022-09-05 DIAGNOSIS — F17210 Nicotine dependence, cigarettes, uncomplicated: Secondary | ICD-10-CM | POA: Diagnosis not present

## 2022-09-05 DIAGNOSIS — Z8679 Personal history of other diseases of the circulatory system: Secondary | ICD-10-CM | POA: Diagnosis not present

## 2022-09-05 DIAGNOSIS — T1490XA Injury, unspecified, initial encounter: Secondary | ICD-10-CM | POA: Diagnosis not present

## 2022-09-05 DIAGNOSIS — M25562 Pain in left knee: Secondary | ICD-10-CM | POA: Diagnosis not present

## 2022-09-05 DIAGNOSIS — K219 Gastro-esophageal reflux disease without esophagitis: Secondary | ICD-10-CM | POA: Diagnosis not present

## 2022-09-05 DIAGNOSIS — M48061 Spinal stenosis, lumbar region without neurogenic claudication: Secondary | ICD-10-CM | POA: Diagnosis not present

## 2022-09-05 DIAGNOSIS — Z79891 Long term (current) use of opiate analgesic: Secondary | ICD-10-CM | POA: Diagnosis not present

## 2022-09-05 DIAGNOSIS — N39498 Other specified urinary incontinence: Secondary | ICD-10-CM | POA: Diagnosis not present

## 2022-09-05 DIAGNOSIS — M5126 Other intervertebral disc displacement, lumbar region: Secondary | ICD-10-CM | POA: Diagnosis not present

## 2022-09-05 DIAGNOSIS — J449 Chronic obstructive pulmonary disease, unspecified: Secondary | ICD-10-CM | POA: Diagnosis not present

## 2022-09-05 DIAGNOSIS — G894 Chronic pain syndrome: Secondary | ICD-10-CM | POA: Diagnosis not present

## 2022-09-05 DIAGNOSIS — I509 Heart failure, unspecified: Secondary | ICD-10-CM | POA: Diagnosis not present

## 2022-09-05 DIAGNOSIS — Z720 Tobacco use: Secondary | ICD-10-CM | POA: Diagnosis not present

## 2022-09-05 DIAGNOSIS — Z01818 Encounter for other preprocedural examination: Secondary | ICD-10-CM | POA: Diagnosis not present

## 2022-09-05 DIAGNOSIS — J410 Simple chronic bronchitis: Secondary | ICD-10-CM | POA: Diagnosis not present

## 2022-09-05 DIAGNOSIS — M961 Postlaminectomy syndrome, not elsewhere classified: Secondary | ICD-10-CM | POA: Diagnosis not present

## 2022-09-05 DIAGNOSIS — M546 Pain in thoracic spine: Secondary | ICD-10-CM | POA: Diagnosis not present

## 2022-09-06 DIAGNOSIS — M5116 Intervertebral disc disorders with radiculopathy, lumbar region: Secondary | ICD-10-CM | POA: Diagnosis not present

## 2022-09-06 DIAGNOSIS — M48061 Spinal stenosis, lumbar region without neurogenic claudication: Secondary | ICD-10-CM | POA: Diagnosis not present

## 2022-09-06 DIAGNOSIS — Z01818 Encounter for other preprocedural examination: Secondary | ICD-10-CM | POA: Diagnosis not present

## 2022-09-07 ENCOUNTER — Telehealth: Payer: Self-pay | Admitting: Family Medicine

## 2022-09-07 NOTE — Telephone Encounter (Signed)
Routed message

## 2022-09-10 ENCOUNTER — Telehealth: Payer: Self-pay | Admitting: *Deleted

## 2022-09-10 ENCOUNTER — Encounter: Payer: Self-pay | Admitting: Family Medicine

## 2022-09-10 NOTE — Transitions of Care (Post Inpatient/ED Visit) (Signed)
   09/10/2022  Name: Brenda Berry MRN: [id number] DOB: 02-Sep-1970  Today's TOC FU Call Status: Today's TOC FU Call Status:: Unsuccessul Call (1st Attempt) Unsuccessful Call (1st Attempt) Date: 09/10/22  Attempted to reach the patient regarding the most recent Inpatient/ED visit.  Follow Up Plan: Additional outreach attempts will be made to reach the patient to complete the Transitions of Care (Post Inpatient/ED visit) call.   [facility] [phone]

## 2022-09-11 ENCOUNTER — Telehealth: Payer: Self-pay | Admitting: *Deleted

## 2022-09-11 NOTE — Transitions of Care (Post Inpatient/ED Visit) (Signed)
   09/11/2022  Name: Brenda Berry MRN: [id number] DOB: Sep 13, 1970  Today's TOC FU Call Status: Today's TOC FU Call Status:: Unsuccessful Call (2nd Attempt) Unsuccessful Call (2nd Attempt) Date: 09/11/22  Attempted to reach the patient regarding the most recent Inpatient/ED visit.  Follow Up Plan: Additional outreach attempts will be made to reach the patient to complete the Transitions of Care (Post Inpatient/ED visit) call.  [facility] [phone]

## 2022-09-12 ENCOUNTER — Telehealth: Payer: Self-pay | Admitting: *Deleted

## 2022-09-12 DIAGNOSIS — M25562 Pain in left knee: Secondary | ICD-10-CM | POA: Diagnosis not present

## 2022-09-12 DIAGNOSIS — M546 Pain in thoracic spine: Secondary | ICD-10-CM | POA: Diagnosis not present

## 2022-09-12 DIAGNOSIS — M545 Low back pain, unspecified: Secondary | ICD-10-CM | POA: Diagnosis not present

## 2022-09-12 DIAGNOSIS — Z79891 Long term (current) use of opiate analgesic: Secondary | ICD-10-CM | POA: Diagnosis not present

## 2022-09-12 DIAGNOSIS — M25561 Pain in right knee: Secondary | ICD-10-CM | POA: Diagnosis not present

## 2022-09-12 DIAGNOSIS — M961 Postlaminectomy syndrome, not elsewhere classified: Secondary | ICD-10-CM | POA: Diagnosis not present

## 2022-09-12 DIAGNOSIS — G894 Chronic pain syndrome: Secondary | ICD-10-CM | POA: Diagnosis not present

## 2022-09-12 NOTE — Transitions of Care (Post Inpatient/ED Visit) (Signed)
   09/12/2022  Name: Brenda Berry MRN: [id number] DOB: 01-May-1971  Today's TOC FU Call Status: Today's TOC FU Call Status:: Unsuccessful Call (3rd Attempt) Unsuccessful Call (3rd Attempt) Date: 09/12/22  Attempted to reach the patient regarding the most recent Inpatient/ED visit. Per message this call cannot be completed at this time. Please hang up and try again later.  Follow Up Plan: No further outreach attempts will be made at this time. We have been unable to contact the patient.  [facility] [phone]

## 2022-09-13 ENCOUNTER — Inpatient Hospital Stay: Payer: Medicare HMO | Admitting: Family Medicine

## 2022-09-18 DIAGNOSIS — M25551 Pain in right hip: Secondary | ICD-10-CM | POA: Diagnosis not present

## 2022-09-18 DIAGNOSIS — M545 Low back pain, unspecified: Secondary | ICD-10-CM | POA: Diagnosis not present

## 2022-09-18 DIAGNOSIS — M5116 Intervertebral disc disorders with radiculopathy, lumbar region: Secondary | ICD-10-CM | POA: Diagnosis not present

## 2022-09-18 DIAGNOSIS — R9431 Abnormal electrocardiogram [ECG] [EKG]: Secondary | ICD-10-CM | POA: Diagnosis not present

## 2022-09-18 DIAGNOSIS — M549 Dorsalgia, unspecified: Secondary | ICD-10-CM | POA: Diagnosis not present

## 2022-09-18 DIAGNOSIS — S3993XA Unspecified injury of pelvis, initial encounter: Secondary | ICD-10-CM | POA: Diagnosis not present

## 2022-09-18 DIAGNOSIS — M533 Sacrococcygeal disorders, not elsewhere classified: Secondary | ICD-10-CM | POA: Diagnosis not present

## 2022-09-18 DIAGNOSIS — W1830XA Fall on same level, unspecified, initial encounter: Secondary | ICD-10-CM | POA: Diagnosis not present

## 2022-09-18 DIAGNOSIS — M48061 Spinal stenosis, lumbar region without neurogenic claudication: Secondary | ICD-10-CM | POA: Diagnosis not present

## 2022-09-18 DIAGNOSIS — T1490XA Injury, unspecified, initial encounter: Secondary | ICD-10-CM | POA: Diagnosis not present

## 2022-09-18 DIAGNOSIS — R102 Pelvic and perineal pain: Secondary | ICD-10-CM | POA: Diagnosis not present

## 2022-09-18 DIAGNOSIS — F17210 Nicotine dependence, cigarettes, uncomplicated: Secondary | ICD-10-CM | POA: Diagnosis not present

## 2022-09-18 DIAGNOSIS — M25552 Pain in left hip: Secondary | ICD-10-CM | POA: Diagnosis not present

## 2022-09-19 DIAGNOSIS — N3010 Interstitial cystitis (chronic) without hematuria: Secondary | ICD-10-CM | POA: Diagnosis not present

## 2022-09-19 DIAGNOSIS — J410 Simple chronic bronchitis: Secondary | ICD-10-CM | POA: Diagnosis not present

## 2022-09-19 DIAGNOSIS — G894 Chronic pain syndrome: Secondary | ICD-10-CM | POA: Diagnosis not present

## 2022-09-19 DIAGNOSIS — T1490XA Injury, unspecified, initial encounter: Secondary | ICD-10-CM | POA: Diagnosis not present

## 2022-09-19 DIAGNOSIS — I509 Heart failure, unspecified: Secondary | ICD-10-CM | POA: Diagnosis not present

## 2022-09-19 DIAGNOSIS — M545 Low back pain, unspecified: Secondary | ICD-10-CM | POA: Diagnosis not present

## 2022-09-19 DIAGNOSIS — M5116 Intervertebral disc disorders with radiculopathy, lumbar region: Secondary | ICD-10-CM | POA: Diagnosis not present

## 2022-09-19 DIAGNOSIS — M48061 Spinal stenosis, lumbar region without neurogenic claudication: Secondary | ICD-10-CM | POA: Diagnosis not present

## 2022-09-19 DIAGNOSIS — F17200 Nicotine dependence, unspecified, uncomplicated: Secondary | ICD-10-CM | POA: Diagnosis not present

## 2022-09-19 DIAGNOSIS — M79604 Pain in right leg: Secondary | ICD-10-CM | POA: Diagnosis not present

## 2022-09-19 DIAGNOSIS — M5126 Other intervertebral disc displacement, lumbar region: Secondary | ICD-10-CM | POA: Diagnosis not present

## 2022-09-19 DIAGNOSIS — S3993XA Unspecified injury of pelvis, initial encounter: Secondary | ICD-10-CM | POA: Diagnosis not present

## 2022-09-19 DIAGNOSIS — Z01818 Encounter for other preprocedural examination: Secondary | ICD-10-CM | POA: Diagnosis not present

## 2022-09-19 DIAGNOSIS — R1310 Dysphagia, unspecified: Secondary | ICD-10-CM | POA: Diagnosis not present

## 2022-09-25 ENCOUNTER — Telehealth: Payer: Self-pay | Admitting: Family Medicine

## 2022-09-25 DIAGNOSIS — Z01818 Encounter for other preprocedural examination: Secondary | ICD-10-CM | POA: Diagnosis not present

## 2022-09-25 DIAGNOSIS — G894 Chronic pain syndrome: Secondary | ICD-10-CM | POA: Diagnosis not present

## 2022-09-25 NOTE — Telephone Encounter (Signed)
Tried to reach patient to schedule appt. Unable to reach. Phone number seems to need updating.

## 2022-09-26 DIAGNOSIS — F17200 Nicotine dependence, unspecified, uncomplicated: Secondary | ICD-10-CM | POA: Diagnosis not present

## 2022-09-26 DIAGNOSIS — M1990 Unspecified osteoarthritis, unspecified site: Secondary | ICD-10-CM | POA: Diagnosis not present

## 2022-09-26 DIAGNOSIS — M5126 Other intervertebral disc displacement, lumbar region: Secondary | ICD-10-CM | POA: Diagnosis not present

## 2022-09-26 DIAGNOSIS — G2581 Restless legs syndrome: Secondary | ICD-10-CM | POA: Diagnosis not present

## 2022-09-26 DIAGNOSIS — I509 Heart failure, unspecified: Secondary | ICD-10-CM | POA: Diagnosis not present

## 2022-09-26 DIAGNOSIS — J410 Simple chronic bronchitis: Secondary | ICD-10-CM | POA: Diagnosis not present

## 2022-09-26 DIAGNOSIS — G894 Chronic pain syndrome: Secondary | ICD-10-CM | POA: Diagnosis not present

## 2022-09-26 DIAGNOSIS — M48061 Spinal stenosis, lumbar region without neurogenic claudication: Secondary | ICD-10-CM | POA: Diagnosis not present

## 2022-09-26 DIAGNOSIS — K219 Gastro-esophageal reflux disease without esophagitis: Secondary | ICD-10-CM | POA: Diagnosis not present

## 2022-09-26 DIAGNOSIS — M5441 Lumbago with sciatica, right side: Secondary | ICD-10-CM | POA: Diagnosis not present

## 2022-09-26 DIAGNOSIS — R002 Palpitations: Secondary | ICD-10-CM | POA: Diagnosis not present

## 2022-09-26 DIAGNOSIS — M5116 Intervertebral disc disorders with radiculopathy, lumbar region: Secondary | ICD-10-CM | POA: Diagnosis not present

## 2022-09-26 DIAGNOSIS — J449 Chronic obstructive pulmonary disease, unspecified: Secondary | ICD-10-CM | POA: Diagnosis not present

## 2022-09-26 DIAGNOSIS — M9953 Intervertebral disc stenosis of neural canal of lumbar region: Secondary | ICD-10-CM | POA: Diagnosis not present

## 2022-09-26 DIAGNOSIS — I5020 Unspecified systolic (congestive) heart failure: Secondary | ICD-10-CM | POA: Diagnosis not present

## 2022-09-26 DIAGNOSIS — F17210 Nicotine dependence, cigarettes, uncomplicated: Secondary | ICD-10-CM | POA: Diagnosis not present

## 2022-09-27 DIAGNOSIS — F17210 Nicotine dependence, cigarettes, uncomplicated: Secondary | ICD-10-CM | POA: Diagnosis not present

## 2022-09-27 DIAGNOSIS — J410 Simple chronic bronchitis: Secondary | ICD-10-CM | POA: Diagnosis not present

## 2022-09-27 DIAGNOSIS — K219 Gastro-esophageal reflux disease without esophagitis: Secondary | ICD-10-CM | POA: Diagnosis not present

## 2022-09-27 DIAGNOSIS — F17200 Nicotine dependence, unspecified, uncomplicated: Secondary | ICD-10-CM | POA: Diagnosis not present

## 2022-09-27 DIAGNOSIS — M5126 Other intervertebral disc displacement, lumbar region: Secondary | ICD-10-CM | POA: Diagnosis not present

## 2022-09-27 DIAGNOSIS — M9953 Intervertebral disc stenosis of neural canal of lumbar region: Secondary | ICD-10-CM | POA: Diagnosis not present

## 2022-09-27 DIAGNOSIS — I5020 Unspecified systolic (congestive) heart failure: Secondary | ICD-10-CM | POA: Diagnosis not present

## 2022-09-27 DIAGNOSIS — J449 Chronic obstructive pulmonary disease, unspecified: Secondary | ICD-10-CM | POA: Diagnosis not present

## 2022-09-27 DIAGNOSIS — M5116 Intervertebral disc disorders with radiculopathy, lumbar region: Secondary | ICD-10-CM | POA: Diagnosis not present

## 2022-09-27 DIAGNOSIS — I509 Heart failure, unspecified: Secondary | ICD-10-CM | POA: Diagnosis not present

## 2022-09-27 DIAGNOSIS — M5441 Lumbago with sciatica, right side: Secondary | ICD-10-CM | POA: Diagnosis not present

## 2022-09-27 DIAGNOSIS — M48061 Spinal stenosis, lumbar region without neurogenic claudication: Secondary | ICD-10-CM | POA: Diagnosis not present

## 2022-09-27 DIAGNOSIS — R002 Palpitations: Secondary | ICD-10-CM | POA: Diagnosis not present

## 2022-09-27 DIAGNOSIS — G2581 Restless legs syndrome: Secondary | ICD-10-CM | POA: Diagnosis not present

## 2022-09-27 DIAGNOSIS — M1990 Unspecified osteoarthritis, unspecified site: Secondary | ICD-10-CM | POA: Diagnosis not present

## 2022-09-27 DIAGNOSIS — G894 Chronic pain syndrome: Secondary | ICD-10-CM | POA: Diagnosis not present

## 2022-10-02 DIAGNOSIS — G8918 Other acute postprocedural pain: Secondary | ICD-10-CM | POA: Diagnosis not present

## 2022-10-10 DIAGNOSIS — M25562 Pain in left knee: Secondary | ICD-10-CM | POA: Diagnosis not present

## 2022-10-10 DIAGNOSIS — M545 Low back pain, unspecified: Secondary | ICD-10-CM | POA: Diagnosis not present

## 2022-10-10 DIAGNOSIS — G894 Chronic pain syndrome: Secondary | ICD-10-CM | POA: Diagnosis not present

## 2022-10-10 DIAGNOSIS — Z79891 Long term (current) use of opiate analgesic: Secondary | ICD-10-CM | POA: Diagnosis not present

## 2022-10-10 DIAGNOSIS — M25561 Pain in right knee: Secondary | ICD-10-CM | POA: Diagnosis not present

## 2022-10-10 DIAGNOSIS — M546 Pain in thoracic spine: Secondary | ICD-10-CM | POA: Diagnosis not present

## 2022-10-10 DIAGNOSIS — M961 Postlaminectomy syndrome, not elsewhere classified: Secondary | ICD-10-CM | POA: Diagnosis not present

## 2022-11-07 DIAGNOSIS — G894 Chronic pain syndrome: Secondary | ICD-10-CM | POA: Diagnosis not present

## 2022-11-07 DIAGNOSIS — Z79891 Long term (current) use of opiate analgesic: Secondary | ICD-10-CM | POA: Diagnosis not present

## 2022-11-07 DIAGNOSIS — M961 Postlaminectomy syndrome, not elsewhere classified: Secondary | ICD-10-CM | POA: Diagnosis not present

## 2022-11-07 DIAGNOSIS — M25562 Pain in left knee: Secondary | ICD-10-CM | POA: Diagnosis not present

## 2022-11-07 DIAGNOSIS — M546 Pain in thoracic spine: Secondary | ICD-10-CM | POA: Diagnosis not present

## 2022-11-07 DIAGNOSIS — M25561 Pain in right knee: Secondary | ICD-10-CM | POA: Diagnosis not present

## 2022-11-07 DIAGNOSIS — M545 Low back pain, unspecified: Secondary | ICD-10-CM | POA: Diagnosis not present

## 2022-11-07 DIAGNOSIS — Z79899 Other long term (current) drug therapy: Secondary | ICD-10-CM | POA: Diagnosis not present

## 2022-12-05 DIAGNOSIS — M25562 Pain in left knee: Secondary | ICD-10-CM | POA: Diagnosis not present

## 2022-12-05 DIAGNOSIS — Z79899 Other long term (current) drug therapy: Secondary | ICD-10-CM | POA: Diagnosis not present

## 2022-12-05 DIAGNOSIS — M25561 Pain in right knee: Secondary | ICD-10-CM | POA: Diagnosis not present

## 2022-12-05 DIAGNOSIS — M545 Low back pain, unspecified: Secondary | ICD-10-CM | POA: Diagnosis not present

## 2022-12-05 DIAGNOSIS — M546 Pain in thoracic spine: Secondary | ICD-10-CM | POA: Diagnosis not present

## 2022-12-05 DIAGNOSIS — G894 Chronic pain syndrome: Secondary | ICD-10-CM | POA: Diagnosis not present

## 2022-12-05 DIAGNOSIS — M961 Postlaminectomy syndrome, not elsewhere classified: Secondary | ICD-10-CM | POA: Diagnosis not present

## 2022-12-05 DIAGNOSIS — Z79891 Long term (current) use of opiate analgesic: Secondary | ICD-10-CM | POA: Diagnosis not present

## 2023-01-02 DIAGNOSIS — M546 Pain in thoracic spine: Secondary | ICD-10-CM | POA: Diagnosis not present

## 2023-01-02 DIAGNOSIS — Z79899 Other long term (current) drug therapy: Secondary | ICD-10-CM | POA: Diagnosis not present

## 2023-01-02 DIAGNOSIS — M961 Postlaminectomy syndrome, not elsewhere classified: Secondary | ICD-10-CM | POA: Diagnosis not present

## 2023-01-02 DIAGNOSIS — Z79891 Long term (current) use of opiate analgesic: Secondary | ICD-10-CM | POA: Diagnosis not present

## 2023-01-02 DIAGNOSIS — M545 Low back pain, unspecified: Secondary | ICD-10-CM | POA: Diagnosis not present

## 2023-01-02 DIAGNOSIS — M25561 Pain in right knee: Secondary | ICD-10-CM | POA: Diagnosis not present

## 2023-01-02 DIAGNOSIS — G894 Chronic pain syndrome: Secondary | ICD-10-CM | POA: Diagnosis not present

## 2023-01-02 DIAGNOSIS — M25562 Pain in left knee: Secondary | ICD-10-CM | POA: Diagnosis not present

## 2023-01-30 DIAGNOSIS — G894 Chronic pain syndrome: Secondary | ICD-10-CM | POA: Diagnosis not present

## 2023-01-30 DIAGNOSIS — M961 Postlaminectomy syndrome, not elsewhere classified: Secondary | ICD-10-CM | POA: Diagnosis not present

## 2023-01-30 DIAGNOSIS — M545 Low back pain, unspecified: Secondary | ICD-10-CM | POA: Diagnosis not present

## 2023-01-30 DIAGNOSIS — M25561 Pain in right knee: Secondary | ICD-10-CM | POA: Diagnosis not present

## 2023-01-30 DIAGNOSIS — M25562 Pain in left knee: Secondary | ICD-10-CM | POA: Diagnosis not present

## 2023-01-30 DIAGNOSIS — Z79891 Long term (current) use of opiate analgesic: Secondary | ICD-10-CM | POA: Diagnosis not present

## 2023-02-27 DIAGNOSIS — M25561 Pain in right knee: Secondary | ICD-10-CM | POA: Diagnosis not present

## 2023-02-27 DIAGNOSIS — M545 Low back pain, unspecified: Secondary | ICD-10-CM | POA: Diagnosis not present

## 2023-02-27 DIAGNOSIS — M546 Pain in thoracic spine: Secondary | ICD-10-CM | POA: Diagnosis not present

## 2023-02-27 DIAGNOSIS — G894 Chronic pain syndrome: Secondary | ICD-10-CM | POA: Diagnosis not present

## 2023-02-27 DIAGNOSIS — Z79891 Long term (current) use of opiate analgesic: Secondary | ICD-10-CM | POA: Diagnosis not present

## 2023-02-27 DIAGNOSIS — M961 Postlaminectomy syndrome, not elsewhere classified: Secondary | ICD-10-CM | POA: Diagnosis not present

## 2023-02-27 DIAGNOSIS — M25562 Pain in left knee: Secondary | ICD-10-CM | POA: Diagnosis not present

## 2023-03-27 DIAGNOSIS — M25562 Pain in left knee: Secondary | ICD-10-CM | POA: Diagnosis not present

## 2023-03-27 DIAGNOSIS — M961 Postlaminectomy syndrome, not elsewhere classified: Secondary | ICD-10-CM | POA: Diagnosis not present

## 2023-03-27 DIAGNOSIS — Z79891 Long term (current) use of opiate analgesic: Secondary | ICD-10-CM | POA: Diagnosis not present

## 2023-03-27 DIAGNOSIS — G894 Chronic pain syndrome: Secondary | ICD-10-CM | POA: Diagnosis not present

## 2023-03-27 DIAGNOSIS — M546 Pain in thoracic spine: Secondary | ICD-10-CM | POA: Diagnosis not present

## 2023-03-27 DIAGNOSIS — M25561 Pain in right knee: Secondary | ICD-10-CM | POA: Diagnosis not present

## 2023-03-27 DIAGNOSIS — M545 Low back pain, unspecified: Secondary | ICD-10-CM | POA: Diagnosis not present

## 2023-05-01 DIAGNOSIS — G894 Chronic pain syndrome: Secondary | ICD-10-CM | POA: Diagnosis not present

## 2023-05-01 DIAGNOSIS — Z79891 Long term (current) use of opiate analgesic: Secondary | ICD-10-CM | POA: Diagnosis not present

## 2023-05-01 DIAGNOSIS — M25561 Pain in right knee: Secondary | ICD-10-CM | POA: Diagnosis not present

## 2023-05-01 DIAGNOSIS — M25562 Pain in left knee: Secondary | ICD-10-CM | POA: Diagnosis not present

## 2023-05-01 DIAGNOSIS — M545 Low back pain, unspecified: Secondary | ICD-10-CM | POA: Diagnosis not present

## 2023-05-01 DIAGNOSIS — M546 Pain in thoracic spine: Secondary | ICD-10-CM | POA: Diagnosis not present

## 2023-06-03 DIAGNOSIS — M545 Low back pain, unspecified: Secondary | ICD-10-CM | POA: Diagnosis not present

## 2023-06-03 DIAGNOSIS — M546 Pain in thoracic spine: Secondary | ICD-10-CM | POA: Diagnosis not present

## 2023-06-03 DIAGNOSIS — G894 Chronic pain syndrome: Secondary | ICD-10-CM | POA: Diagnosis not present

## 2023-06-03 DIAGNOSIS — Z79891 Long term (current) use of opiate analgesic: Secondary | ICD-10-CM | POA: Diagnosis not present

## 2023-06-03 DIAGNOSIS — M25561 Pain in right knee: Secondary | ICD-10-CM | POA: Diagnosis not present

## 2023-06-03 DIAGNOSIS — M25562 Pain in left knee: Secondary | ICD-10-CM | POA: Diagnosis not present

## 2023-07-08 DIAGNOSIS — G894 Chronic pain syndrome: Secondary | ICD-10-CM | POA: Diagnosis not present

## 2023-07-08 DIAGNOSIS — Z79891 Long term (current) use of opiate analgesic: Secondary | ICD-10-CM | POA: Diagnosis not present

## 2023-07-08 DIAGNOSIS — M25562 Pain in left knee: Secondary | ICD-10-CM | POA: Diagnosis not present

## 2023-07-08 DIAGNOSIS — M545 Low back pain, unspecified: Secondary | ICD-10-CM | POA: Diagnosis not present

## 2023-07-08 DIAGNOSIS — M546 Pain in thoracic spine: Secondary | ICD-10-CM | POA: Diagnosis not present

## 2023-08-07 DIAGNOSIS — M546 Pain in thoracic spine: Secondary | ICD-10-CM | POA: Diagnosis not present

## 2023-08-07 DIAGNOSIS — G894 Chronic pain syndrome: Secondary | ICD-10-CM | POA: Diagnosis not present

## 2023-08-07 DIAGNOSIS — Z79891 Long term (current) use of opiate analgesic: Secondary | ICD-10-CM | POA: Diagnosis not present

## 2023-08-07 DIAGNOSIS — M545 Low back pain, unspecified: Secondary | ICD-10-CM | POA: Diagnosis not present

## 2023-08-07 DIAGNOSIS — M25561 Pain in right knee: Secondary | ICD-10-CM | POA: Diagnosis not present

## 2023-09-02 DIAGNOSIS — M25561 Pain in right knee: Secondary | ICD-10-CM | POA: Diagnosis not present

## 2023-09-02 DIAGNOSIS — M546 Pain in thoracic spine: Secondary | ICD-10-CM | POA: Diagnosis not present

## 2023-09-02 DIAGNOSIS — Z79891 Long term (current) use of opiate analgesic: Secondary | ICD-10-CM | POA: Diagnosis not present

## 2023-09-02 DIAGNOSIS — G894 Chronic pain syndrome: Secondary | ICD-10-CM | POA: Diagnosis not present

## 2023-09-02 DIAGNOSIS — M545 Low back pain, unspecified: Secondary | ICD-10-CM | POA: Diagnosis not present

## 2023-09-30 DIAGNOSIS — G8929 Other chronic pain: Secondary | ICD-10-CM | POA: Diagnosis not present

## 2023-09-30 DIAGNOSIS — Z79891 Long term (current) use of opiate analgesic: Secondary | ICD-10-CM | POA: Diagnosis not present

## 2023-09-30 DIAGNOSIS — M25562 Pain in left knee: Secondary | ICD-10-CM | POA: Diagnosis not present

## 2023-09-30 DIAGNOSIS — G894 Chronic pain syndrome: Secondary | ICD-10-CM | POA: Diagnosis not present

## 2023-09-30 DIAGNOSIS — M25561 Pain in right knee: Secondary | ICD-10-CM | POA: Diagnosis not present

## 2023-09-30 DIAGNOSIS — D519 Vitamin B12 deficiency anemia, unspecified: Secondary | ICD-10-CM | POA: Diagnosis not present

## 2023-09-30 DIAGNOSIS — M545 Low back pain, unspecified: Secondary | ICD-10-CM | POA: Diagnosis not present

## 2023-09-30 DIAGNOSIS — E559 Vitamin D deficiency, unspecified: Secondary | ICD-10-CM | POA: Diagnosis not present

## 2023-09-30 DIAGNOSIS — D510 Vitamin B12 deficiency anemia due to intrinsic factor deficiency: Secondary | ICD-10-CM | POA: Diagnosis not present

## 2023-09-30 DIAGNOSIS — R52 Pain, unspecified: Secondary | ICD-10-CM | POA: Diagnosis not present

## 2023-09-30 DIAGNOSIS — G603 Idiopathic progressive neuropathy: Secondary | ICD-10-CM | POA: Diagnosis not present

## 2023-10-28 DIAGNOSIS — M25561 Pain in right knee: Secondary | ICD-10-CM | POA: Diagnosis not present

## 2023-10-28 DIAGNOSIS — Z79891 Long term (current) use of opiate analgesic: Secondary | ICD-10-CM | POA: Diagnosis not present

## 2023-10-28 DIAGNOSIS — G894 Chronic pain syndrome: Secondary | ICD-10-CM | POA: Diagnosis not present

## 2023-10-28 DIAGNOSIS — M545 Low back pain, unspecified: Secondary | ICD-10-CM | POA: Diagnosis not present

## 2023-11-25 DIAGNOSIS — M25562 Pain in left knee: Secondary | ICD-10-CM | POA: Diagnosis not present

## 2023-11-25 DIAGNOSIS — M546 Pain in thoracic spine: Secondary | ICD-10-CM | POA: Diagnosis not present

## 2023-11-25 DIAGNOSIS — G894 Chronic pain syndrome: Secondary | ICD-10-CM | POA: Diagnosis not present

## 2023-11-25 DIAGNOSIS — M25561 Pain in right knee: Secondary | ICD-10-CM | POA: Diagnosis not present

## 2023-11-25 DIAGNOSIS — Z79891 Long term (current) use of opiate analgesic: Secondary | ICD-10-CM | POA: Diagnosis not present

## 2023-11-25 DIAGNOSIS — M545 Low back pain, unspecified: Secondary | ICD-10-CM | POA: Diagnosis not present

## 2023-12-09 DIAGNOSIS — M542 Cervicalgia: Secondary | ICD-10-CM | POA: Diagnosis not present

## 2023-12-09 DIAGNOSIS — G540 Brachial plexus disorders: Secondary | ICD-10-CM | POA: Diagnosis not present

## 2023-12-09 DIAGNOSIS — M48061 Spinal stenosis, lumbar region without neurogenic claudication: Secondary | ICD-10-CM | POA: Diagnosis not present

## 2023-12-09 DIAGNOSIS — M5030 Other cervical disc degeneration, unspecified cervical region: Secondary | ICD-10-CM | POA: Diagnosis not present

## 2023-12-09 DIAGNOSIS — M5440 Lumbago with sciatica, unspecified side: Secondary | ICD-10-CM | POA: Diagnosis not present

## 2023-12-09 DIAGNOSIS — S335XXA Sprain of ligaments of lumbar spine, initial encounter: Secondary | ICD-10-CM | POA: Diagnosis not present
